# Patient Record
Sex: Female | Born: 1935
Health system: Southern US, Community
[De-identification: ages and names within clinical notes are randomized; demographics above are authoritative.]

## PROBLEM LIST (undated history)

## (undated) DIAGNOSIS — M1990 Unspecified osteoarthritis, unspecified site: Secondary | ICD-10-CM

## (undated) DIAGNOSIS — M75100 Unspecified rotator cuff tear or rupture of unspecified shoulder, not specified as traumatic: Secondary | ICD-10-CM

## (undated) DIAGNOSIS — I4891 Unspecified atrial fibrillation: Secondary | ICD-10-CM

## (undated) DIAGNOSIS — K5732 Diverticulitis of large intestine without perforation or abscess without bleeding: Secondary | ICD-10-CM

## (undated) DIAGNOSIS — F09 Unspecified mental disorder due to known physiological condition: Secondary | ICD-10-CM

## (undated) DIAGNOSIS — D126 Benign neoplasm of colon, unspecified: Secondary | ICD-10-CM

## (undated) DIAGNOSIS — L4050 Arthropathic psoriasis, unspecified: Secondary | ICD-10-CM

## (undated) DIAGNOSIS — G250 Essential tremor: Secondary | ICD-10-CM

## (undated) DIAGNOSIS — F329 Major depressive disorder, single episode, unspecified: Secondary | ICD-10-CM

## (undated) DIAGNOSIS — N83209 Unspecified ovarian cyst, unspecified side: Secondary | ICD-10-CM

## (undated) DIAGNOSIS — I10 Essential (primary) hypertension: Secondary | ICD-10-CM

## (undated) DIAGNOSIS — E785 Hyperlipidemia, unspecified: Secondary | ICD-10-CM

## (undated) DIAGNOSIS — M810 Age-related osteoporosis without current pathological fracture: Secondary | ICD-10-CM

## (undated) DIAGNOSIS — F32A Depression, unspecified: Secondary | ICD-10-CM

## (undated) DIAGNOSIS — J449 Chronic obstructive pulmonary disease, unspecified: Secondary | ICD-10-CM

## (undated) HISTORY — DX: Benign neoplasm of colon, unspecified: D12.6

## (undated) HISTORY — PX: ABDOMINAL HYSTERECTOMY: SHX81

## (undated) HISTORY — DX: Unspecified atrial fibrillation: I48.91

## (undated) HISTORY — DX: Essential tremor: G25.0

## (undated) HISTORY — DX: Hyperlipidemia, unspecified: E78.5

## (undated) HISTORY — PX: COLONOSCOPY: SHX174

## (undated) HISTORY — DX: Age-related osteoporosis without current pathological fracture: M81.0

## (undated) HISTORY — DX: Major depressive disorder, single episode, unspecified: F32.9

## (undated) HISTORY — DX: Unspecified mental disorder due to known physiological condition: F09

## (undated) HISTORY — DX: Unspecified osteoarthritis, unspecified site: M19.90

## (undated) HISTORY — DX: Diverticulitis of large intestine without perforation or abscess without bleeding: K57.32

## (undated) HISTORY — DX: Unspecified ovarian cyst, unspecified side: N83.209

## (undated) HISTORY — DX: Arthropathic psoriasis, unspecified: L40.50

## (undated) HISTORY — DX: Chronic obstructive pulmonary disease, unspecified: J44.9

## (undated) HISTORY — DX: Depression, unspecified: F32.A

## (undated) HISTORY — DX: Unspecified rotator cuff tear or rupture of unspecified shoulder, not specified as traumatic: M75.100

---

## 2005-06-13 ENCOUNTER — Ambulatory Visit: Payer: Self-pay | Admitting: Family Medicine

## 2006-06-14 ENCOUNTER — Ambulatory Visit: Payer: Self-pay | Admitting: Family Medicine

## 2007-06-15 ENCOUNTER — Ambulatory Visit: Payer: Self-pay | Admitting: Unknown Physician Specialty

## 2007-09-19 ENCOUNTER — Ambulatory Visit: Payer: Self-pay | Admitting: Family Medicine

## 2008-12-24 ENCOUNTER — Ambulatory Visit: Payer: Self-pay | Admitting: Family Medicine

## 2010-01-26 ENCOUNTER — Ambulatory Visit: Payer: Self-pay | Admitting: Family Medicine

## 2010-03-23 ENCOUNTER — Ambulatory Visit: Payer: Self-pay | Admitting: Ophthalmology

## 2010-04-06 ENCOUNTER — Ambulatory Visit: Payer: Self-pay | Admitting: Ophthalmology

## 2010-06-22 ENCOUNTER — Ambulatory Visit: Payer: Self-pay | Admitting: Ophthalmology

## 2010-07-06 ENCOUNTER — Ambulatory Visit: Payer: Self-pay | Admitting: Ophthalmology

## 2011-03-30 ENCOUNTER — Ambulatory Visit: Payer: Self-pay | Admitting: Family Medicine

## 2011-05-09 ENCOUNTER — Emergency Department: Payer: Self-pay | Admitting: Internal Medicine

## 2011-09-30 ENCOUNTER — Emergency Department: Payer: Self-pay | Admitting: Emergency Medicine

## 2011-10-05 ENCOUNTER — Emergency Department: Payer: Self-pay | Admitting: *Deleted

## 2011-10-10 ENCOUNTER — Ambulatory Visit: Payer: Self-pay | Admitting: Urology

## 2011-10-14 ENCOUNTER — Ambulatory Visit: Payer: Self-pay | Admitting: Urology

## 2011-11-01 ENCOUNTER — Ambulatory Visit: Payer: Self-pay | Admitting: Urology

## 2011-11-29 ENCOUNTER — Ambulatory Visit: Payer: Self-pay | Admitting: Oncology

## 2011-11-30 LAB — CANCER ANTIGEN 19-9: CA 19-9: 20 U/mL (ref 0–35)

## 2011-11-30 LAB — CEA: CEA: 3.9 ng/mL (ref 0.0–4.7)

## 2011-12-20 ENCOUNTER — Ambulatory Visit: Payer: Self-pay | Admitting: Oncology

## 2012-03-08 ENCOUNTER — Ambulatory Visit: Payer: Self-pay | Admitting: Oncology

## 2012-03-12 ENCOUNTER — Ambulatory Visit: Payer: Self-pay | Admitting: Oncology

## 2012-03-19 ENCOUNTER — Ambulatory Visit: Payer: Self-pay | Admitting: Oncology

## 2014-09-15 ENCOUNTER — Emergency Department: Payer: Self-pay | Admitting: Emergency Medicine

## 2014-09-15 LAB — CBC
HCT: 38.7 % (ref 35.0–47.0)
HGB: 12.2 g/dL (ref 12.0–16.0)
MCH: 27.8 pg (ref 26.0–34.0)
MCHC: 31.5 g/dL — AB (ref 32.0–36.0)
MCV: 88 fL (ref 80–100)
PLATELETS: 181 10*3/uL (ref 150–440)
RBC: 4.38 10*6/uL (ref 3.80–5.20)
RDW: 14.7 % — ABNORMAL HIGH (ref 11.5–14.5)
WBC: 8.9 10*3/uL (ref 3.6–11.0)

## 2014-09-15 LAB — COMPREHENSIVE METABOLIC PANEL
ALBUMIN: 3 g/dL — AB (ref 3.4–5.0)
ALK PHOS: 67 U/L
ALT: 20 U/L
Anion Gap: 5 — ABNORMAL LOW (ref 7–16)
BUN: 19 mg/dL — ABNORMAL HIGH (ref 7–18)
Bilirubin,Total: 0.3 mg/dL (ref 0.2–1.0)
CREATININE: 0.77 mg/dL (ref 0.60–1.30)
Calcium, Total: 8.4 mg/dL — ABNORMAL LOW (ref 8.5–10.1)
Chloride: 107 mmol/L (ref 98–107)
Co2: 28 mmol/L (ref 21–32)
EGFR (African American): >60
Glucose: 88 mg/dL (ref 65–99)
Osmolality: 281 (ref 275–301)
POTASSIUM: 3.8 mmol/L (ref 3.5–5.1)
SGOT(AST): 20 U/L (ref 15–37)
Sodium: 140 mmol/L (ref 136–145)
TOTAL PROTEIN: 7.2 g/dL (ref 6.4–8.2)

## 2014-09-15 LAB — PROTIME-INR
INR: 1
PROTHROMBIN TIME: 13.3 s (ref 11.5–14.7)

## 2015-01-05 DIAGNOSIS — M79641 Pain in right hand: Secondary | ICD-10-CM | POA: Diagnosis not present

## 2015-01-05 DIAGNOSIS — M810 Age-related osteoporosis without current pathological fracture: Secondary | ICD-10-CM | POA: Diagnosis not present

## 2015-01-05 DIAGNOSIS — M1990 Unspecified osteoarthritis, unspecified site: Secondary | ICD-10-CM | POA: Diagnosis not present

## 2015-01-05 DIAGNOSIS — G250 Essential tremor: Secondary | ICD-10-CM | POA: Diagnosis not present

## 2015-01-28 DIAGNOSIS — M1990 Unspecified osteoarthritis, unspecified site: Secondary | ICD-10-CM | POA: Diagnosis not present

## 2015-02-05 DIAGNOSIS — L4050 Arthropathic psoriasis, unspecified: Secondary | ICD-10-CM | POA: Diagnosis not present

## 2015-03-30 DIAGNOSIS — L4050 Arthropathic psoriasis, unspecified: Secondary | ICD-10-CM | POA: Diagnosis not present

## 2015-04-06 DIAGNOSIS — M0609 Rheumatoid arthritis without rheumatoid factor, multiple sites: Secondary | ICD-10-CM | POA: Diagnosis not present

## 2015-04-20 DIAGNOSIS — I10 Essential (primary) hypertension: Secondary | ICD-10-CM | POA: Diagnosis not present

## 2015-04-20 DIAGNOSIS — L4050 Arthropathic psoriasis, unspecified: Secondary | ICD-10-CM | POA: Diagnosis not present

## 2015-04-20 DIAGNOSIS — M810 Age-related osteoporosis without current pathological fracture: Secondary | ICD-10-CM | POA: Insufficient documentation

## 2015-05-13 DIAGNOSIS — M0609 Rheumatoid arthritis without rheumatoid factor, multiple sites: Secondary | ICD-10-CM | POA: Diagnosis not present

## 2015-05-13 DIAGNOSIS — M810 Age-related osteoporosis without current pathological fracture: Secondary | ICD-10-CM | POA: Diagnosis not present

## 2015-05-20 DIAGNOSIS — Z79899 Other long term (current) drug therapy: Secondary | ICD-10-CM | POA: Diagnosis not present

## 2015-05-20 DIAGNOSIS — M0609 Rheumatoid arthritis without rheumatoid factor, multiple sites: Secondary | ICD-10-CM | POA: Diagnosis not present

## 2015-05-20 DIAGNOSIS — M810 Age-related osteoporosis without current pathological fracture: Secondary | ICD-10-CM | POA: Diagnosis not present

## 2015-05-20 DIAGNOSIS — G250 Essential tremor: Secondary | ICD-10-CM | POA: Diagnosis not present

## 2015-05-26 ENCOUNTER — Emergency Department
Admission: EM | Admit: 2015-05-26 | Discharge: 2015-05-26 | Disposition: A | Discharge status: Home / Self Care | Payer: Commercial Managed Care - HMO | Attending: Emergency Medicine | Admitting: Emergency Medicine

## 2015-05-26 ENCOUNTER — Emergency Department: Payer: Commercial Managed Care - HMO

## 2015-05-26 ENCOUNTER — Encounter: Payer: Self-pay | Admitting: Emergency Medicine

## 2015-05-26 ENCOUNTER — Other Ambulatory Visit: Payer: Self-pay

## 2015-05-26 DIAGNOSIS — R4182 Altered mental status, unspecified: Secondary | ICD-10-CM | POA: Diagnosis present

## 2015-05-26 DIAGNOSIS — N390 Urinary tract infection, site not specified: Secondary | ICD-10-CM | POA: Insufficient documentation

## 2015-05-26 DIAGNOSIS — Z87891 Personal history of nicotine dependence: Secondary | ICD-10-CM | POA: Diagnosis not present

## 2015-05-26 DIAGNOSIS — R410 Disorientation, unspecified: Secondary | ICD-10-CM | POA: Diagnosis not present

## 2015-05-26 DIAGNOSIS — I10 Essential (primary) hypertension: Secondary | ICD-10-CM | POA: Diagnosis not present

## 2015-05-26 DIAGNOSIS — Z79899 Other long term (current) drug therapy: Secondary | ICD-10-CM | POA: Diagnosis not present

## 2015-05-26 DIAGNOSIS — Z7982 Long term (current) use of aspirin: Secondary | ICD-10-CM | POA: Insufficient documentation

## 2015-05-26 DIAGNOSIS — I499 Cardiac arrhythmia, unspecified: Secondary | ICD-10-CM | POA: Diagnosis not present

## 2015-05-26 DIAGNOSIS — I638 Other cerebral infarction: Secondary | ICD-10-CM | POA: Diagnosis not present

## 2015-05-26 DIAGNOSIS — R4781 Slurred speech: Secondary | ICD-10-CM | POA: Diagnosis not present

## 2015-05-26 DIAGNOSIS — R079 Chest pain, unspecified: Secondary | ICD-10-CM | POA: Diagnosis not present

## 2015-05-26 HISTORY — DX: Essential (primary) hypertension: I10

## 2015-05-26 HISTORY — DX: Unspecified osteoarthritis, unspecified site: M19.90

## 2015-05-26 LAB — CBC
HCT: 39.6 % (ref 35.0–47.0)
Hemoglobin: 12.6 g/dL (ref 12.0–16.0)
MCH: 28.7 pg (ref 26.0–34.0)
MCHC: 31.8 g/dL — AB (ref 32.0–36.0)
MCV: 90.2 fL (ref 80.0–100.0)
PLATELETS: 210 10*3/uL (ref 150–440)
RBC: 4.39 MIL/uL (ref 3.80–5.20)
RDW: 17.4 % — ABNORMAL HIGH (ref 11.5–14.5)
WBC: 7.5 10*3/uL (ref 3.6–11.0)

## 2015-05-26 LAB — URINALYSIS COMPLETE WITH MICROSCOPIC (ARMC ONLY)
Bacteria, UA: NONE SEEN
Bilirubin Urine: NEGATIVE
GLUCOSE, UA: NEGATIVE mg/dL
Ketones, ur: NEGATIVE mg/dL
Nitrite: POSITIVE — AB
PROTEIN: NEGATIVE mg/dL
SQUAMOUS EPITHELIAL / LPF: NONE SEEN
Specific Gravity, Urine: 1.004 — ABNORMAL LOW (ref 1.005–1.030)
pH: 7 (ref 5.0–8.0)

## 2015-05-26 LAB — COMPREHENSIVE METABOLIC PANEL
ALK PHOS: 62 U/L (ref 38–126)
ALT: 17 U/L (ref 14–54)
AST: 30 U/L (ref 15–41)
Albumin: 3.9 g/dL (ref 3.5–5.0)
Anion gap: 11 (ref 5–15)
BUN: 14 mg/dL (ref 6–20)
CHLORIDE: 101 mmol/L (ref 101–111)
CO2: 27 mmol/L (ref 22–32)
CREATININE: 0.83 mg/dL (ref 0.44–1.00)
Calcium: 9.9 mg/dL (ref 8.9–10.3)
Glucose, Bld: 89 mg/dL (ref 65–99)
POTASSIUM: 4.1 mmol/L (ref 3.5–5.1)
Sodium: 139 mmol/L (ref 135–145)
TOTAL PROTEIN: 7.5 g/dL (ref 6.5–8.1)
Total Bilirubin: 0.6 mg/dL (ref 0.3–1.2)

## 2015-05-26 MED ORDER — CEFTRIAXONE SODIUM IN DEXTROSE 20 MG/ML IV SOLN
1.0000 g | Freq: Once | INTRAVENOUS | Status: AC
  Administered 2015-05-26: 1 g via INTRAVENOUS

## 2015-05-26 MED ORDER — CEFTRIAXONE SODIUM IN DEXTROSE 20 MG/ML IV SOLN
INTRAVENOUS | Status: AC
  Administered 2015-05-26: 1 g via INTRAVENOUS
  Filled 2015-05-26: qty 50

## 2015-05-26 MED ORDER — CEPHALEXIN 500 MG PO CAPS: 500.0000 mg | ORAL_CAPSULE | Freq: Four times a day (QID) | ORAL | Status: DC

## 2015-05-26 NOTE — ED Notes (Signed)
Daughter reports pt has had increased confusion x4 days; reports went to urgent care today and dx with UTI but sent here for further eval. MD at bedside in room.

## 2015-05-26 NOTE — ED Notes (Signed)
Patient presents to the ED with multiple episodes of weakness and confusion over the past several days.  Patient is alert and oriented x 4 at this time.  Patient states she has been having episodes where she has more trouble finding her words or remembering things than usual.  Patient also reports being, "wobbly" when she walks.  Patient was seen at the Urgent Care and was diagnosed with a UTI but the provider there wanted to her be more fully evaluated.  Patient is no obvious distress at this time.

## 2015-05-26 NOTE — Discharge Instructions (Signed)

## 2015-05-26 NOTE — ED Provider Notes (Signed)
[facility] Emergency Department Provider Note  ____________________________________________  Time seen: Approximately 4:48 PM  I have reviewed the triage vital signs and the nursing notes.   HISTORY  Chief Complaint Altered Mental Status    HPI Victoria Esparza is a 81 y.o. female who comes in with her daughter. She is very pleasant and she and her daughter explained to me that on Sunday she was having a fair amount of confusion, trouble operating the blinds in her home, and sometimes her speech seemed a little bit off. This slowly improved and has become better over the last 2 days, but at times she feels just "foggy" and occasionally feels like very tired. She went to urgent care today because her doctor was unavailable, and they diagnosed her with a "UTI" but they did not provide her prescription and recommended she come to the ER to have her confusion further evaluated.  She has not noticed any change in urination. She is not having abdominal pain. She is not having any fever or cough or chest pain. She denies any new symptoms like that of a stroke except for occasionally feeling like "foggy". She has not pain numbness or tingling in her arms or legs. She denies any weakness or face arms or legs. She is on a trouble swallowing. She's never had a previous stroke. She does take a baby aspirin every day.   Past Medical History  Diagnosis Date  . Hypertension   . Arthritis     There are no active problems to display for this patient.   History reviewed. No pertinent past surgical history.  Current Outpatient Rx  Name  Route  Sig  Dispense  Refill  . alendronate (FOSAMAX) 70 MG tablet   Oral   Take 70 mg by mouth once a week. Take with a full glass of water on an empty stomach.         Marland Kitchen aspirin 325 MG EC tablet   Oral   Take 325 mg by mouth daily.         . cephALEXin (KEFLEX) 500 MG capsule   Oral   Take 1 capsule (500 mg total) by mouth 4  (four) times daily.   40 capsule   0   . folic acid (FOLVITE) 1 MG tablet   Oral   Take 1 mg by mouth daily.         . metoprolol tartrate (LOPRESSOR) 25 MG tablet   Oral   Take 25 mg by mouth 2 (two) times daily.           Allergies Review of patient's allergies indicates no known allergies.  No family history on file.  Social History History  Substance Use Topics  . Smoking status: Former Smoker    Quit date: 05/26/1995  . Smokeless tobacco: Not on file  . Alcohol Use: No    Review of Systems Constitutional: No fever/chills Eyes: No visual changes. ENT: No sore throat. Cardiovascular: Denies chest pain. Respiratory: Denies shortness of breath. Gastrointestinal: No abdominal pain.  No nausea, no vomiting.  No diarrhea.  No constipation. Genitourinary: Negative for dysuria. Musculoskeletal: Negative for back pain. Skin: Negative for rash. Neurological: Negative for headaches, focal weakness or numbness. See history of present illness  10-point ROS otherwise negative.  ____________________________________________   PHYSICAL EXAM:  VITAL SIGNS: ED Triage Vitals  Enc Vitals Group     BP 05/26/15 1409 153/71 mmHg     Pulse Rate 05/26/15 1409 55  Resp 05/26/15 1409 16     Temp 05/26/15 1409 97.4 F (36.3 C)     Temp Source 05/26/15 1409 Oral     SpO2 05/26/15 1409 94 %     Weight 05/26/15 1409 150 lb (68.04 kg)     Height 05/26/15 1409 5\' 3"  (1.6 m)     Head Cir --      Peak Flow --      Pain Score 05/26/15 1409 0     Pain Loc --      Pain Edu? --      Excl. in [place]? --     Constitutional: Alert and oriented. Well appearing and in no acute distress. The patient does have obvious essential tremor of her head and upper arms, but this abates with focus and concentration. Eyes: Conjunctivae are normal. PERRL. EOMI. Head: Atraumatic. Nose: No congestion/rhinnorhea. Mouth/Throat: Mucous membranes are moist.  Oropharynx non-erythematous. Neck: No  stridor.   Cardiovascular: Normal rate, regular rhythm. Grossly normal heart sounds.  Good peripheral circulation. Respiratory: Normal respiratory effort.  No retractions. Lungs CTAB. Gastrointestinal: Soft and nontender. No distention. No abdominal bruits. No CVA tenderness. Musculoskeletal: No lower extremity tenderness nor edema.  No joint effusions. Neurologic:  Normal speech and language. No gross focal neurologic deficits are appreciated. Speech is normal. No gait instability. I performed NIH exam and score equals 0. There is no evidence of an acute neurologic deficit to my exam. Skin:  Skin is warm, dry and intact. No rash noted. Psychiatric: Mood and affect are normal. Speech and behavior are normal.  ____________________________________________   LABS (all labs ordered are listed, but only abnormal results are displayed)  Labs Reviewed  URINALYSIS COMPLETEWITH MICROSCOPIC ([facility] ONLY) - Abnormal; Notable for the following:    Color, Urine YELLOW (*)    APPearance CLEAR (*)    Specific Gravity, Urine 1.004 (*)    Hgb urine dipstick 1+ (*)    Nitrite POSITIVE (*)    Leukocytes, UA 2+ (*)    All other components within normal limits  CBC - Abnormal; Notable for the following:    MCHC 31.8 (*)    RDW 17.4 (*)    All other components within normal limits  URINE CULTURE  COMPREHENSIVE METABOLIC PANEL  CBG MONITORING, ED   ____________________________________________  EKG   Date: 05/26/2015  Rate: 58  Rhythm: normal sinus rhythm  QRS Axis: normal  Intervals: normal  ST/T Wave abnormalities: normal  Conduction Disutrbances: none  Narrative Interpretation: unremarkable, some tremor is present. The patient does have a history of  tremor     ____________________________________________  RADIOLOGY   IMPRESSION: No acute intracranial abnormality.  Mild cerebral atrophy and chronic small vessel  disease. ____________________________________________   PROCEDURES  Procedure(s) performed: None  Critical Care performed: No  ____________________________________________   INITIAL IMPRESSION / ASSESSMENT AND PLAN / ED COURSE  Pertinent labs & imaging results that were available during my care of the patient were reviewed by me and considered in my medical decision making (see chart for details).  The patient has a very reassuring exam and ER. We will obtain a urinalysis to evaluate for the possibility of UTI, which is a very common cause of confusion in the elderly. She is quite hemogram was stable. Her labs drawn at triage are normal. There is no indication to obtain LFTs. Her EKG demonstrates no ischemia. I will obtain a CT head as she has reported having some off and on difficulty with speaking and confusion,  after 2-3 days I would expect CT to show any infarct. She has no previous history of a stroke. Her exam right now does not suggest acute neurologic deficit or stroke. I also doubt that this represents a TIA. The patient is on baby aspirin daily for stroke and cardiovascular protection already.  ----------------------------------------- 6:21 PM on 05/26/2015 -----------------------------------------  Urinalysis reviewed which appears positive. There is no bacteria noted, however given the number of white cells along with positive leukocyte esterase feel this is likely representing urinary tract infection. We will send for culture. I will treat the patient with ceftriaxone as well as a week of Keflex as recommended by infectious disease team.  Patient will follow-up with her primary care doctor within 1-2 days for reevaluation. She has good care from family and friends, I advised her and her family on strict return precautions. Should her confusion worsen, she hasn't fevers, weakness, vomiting, or other new concerns arise she'll return to the  ER.  ____________________________________________   FINAL CLINICAL IMPRESSION(S) / ED DIAGNOSES  Final diagnoses:  Acute UTI (urinary tract infection)      Delman Kitten, MD 05/26/15 [id number]

## 2015-05-28 LAB — URINE CULTURE: Culture: >=100000

## 2015-06-15 DIAGNOSIS — L4050 Arthropathic psoriasis, unspecified: Secondary | ICD-10-CM | POA: Diagnosis not present

## 2015-06-15 DIAGNOSIS — N390 Urinary tract infection, site not specified: Secondary | ICD-10-CM | POA: Diagnosis not present

## 2015-06-25 DIAGNOSIS — M1990 Unspecified osteoarthritis, unspecified site: Secondary | ICD-10-CM | POA: Diagnosis not present

## 2015-06-25 DIAGNOSIS — N390 Urinary tract infection, site not specified: Secondary | ICD-10-CM | POA: Diagnosis not present

## 2015-06-29 DIAGNOSIS — D72829 Elevated white blood cell count, unspecified: Secondary | ICD-10-CM | POA: Diagnosis not present

## 2015-06-30 DIAGNOSIS — G250 Essential tremor: Secondary | ICD-10-CM | POA: Diagnosis not present

## 2015-06-30 DIAGNOSIS — M79641 Pain in right hand: Secondary | ICD-10-CM | POA: Diagnosis not present

## 2015-06-30 DIAGNOSIS — M0609 Rheumatoid arthritis without rheumatoid factor, multiple sites: Secondary | ICD-10-CM | POA: Diagnosis not present

## 2015-07-02 DIAGNOSIS — R7612 Nonspecific reaction to cell mediated immunity measurement of gamma interferon antigen response without active tuberculosis: Secondary | ICD-10-CM | POA: Diagnosis not present

## 2015-07-02 DIAGNOSIS — M0579 Rheumatoid arthritis with rheumatoid factor of multiple sites without organ or systems involvement: Secondary | ICD-10-CM | POA: Diagnosis not present

## 2015-07-07 DIAGNOSIS — E871 Hypo-osmolality and hyponatremia: Secondary | ICD-10-CM | POA: Diagnosis not present

## 2015-07-07 DIAGNOSIS — R7982 Elevated C-reactive protein (CRP): Secondary | ICD-10-CM | POA: Diagnosis not present

## 2015-07-07 DIAGNOSIS — D72828 Other elevated white blood cell count: Secondary | ICD-10-CM | POA: Diagnosis not present

## 2015-07-07 DIAGNOSIS — R700 Elevated erythrocyte sedimentation rate: Secondary | ICD-10-CM | POA: Diagnosis not present

## 2015-07-22 ENCOUNTER — Ambulatory Visit: Payer: Commercial Managed Care - HMO | Attending: Rheumatology | Admitting: Occupational Therapy

## 2015-07-22 DIAGNOSIS — M79641 Pain in right hand: Secondary | ICD-10-CM | POA: Diagnosis not present

## 2015-07-22 DIAGNOSIS — M6281 Muscle weakness (generalized): Secondary | ICD-10-CM

## 2015-07-22 DIAGNOSIS — M25631 Stiffness of right wrist, not elsewhere classified: Secondary | ICD-10-CM | POA: Insufficient documentation

## 2015-07-22 DIAGNOSIS — M25641 Stiffness of right hand, not elsewhere classified: Secondary | ICD-10-CM | POA: Insufficient documentation

## 2015-07-22 NOTE — Patient Instructions (Signed)
Contrast baths  AROM for tendon glides - separate steps - and 3 cm foam block in palm with full fist step  Opposition to all digits - tip to tip

## 2015-07-22 NOTE — Therapy (Signed)
[facility] [address] [place], [address] Phone: [phone]   Fax:  [phone]  Occupational Therapy Treatment  Patient Details  Name: Victoria Esparza MRN: [id number] Date of Birth: [birth year] Referring Provider:  Emmaline Kluver.,*  Encounter Date: 07/22/2015      OT End of Session - 07/22/15 1206    Visit Number 1   Number of Visits 8   Date for OT Re-Evaluation 08/19/15   OT Start Time 1025   OT Stop Time 1118   OT Time Calculation (min) 53 min   Activity Tolerance Patient tolerated treatment well   Behavior During Therapy [facility] for tasks assessed/performed      Past Medical History  Diagnosis Date  . Hypertension   . Arthritis     No past surgical history on file.  There were no vitals filed for this visit.  Visit Diagnosis:  Stiffness of joint, hand, right - Plan: Ot plan of care cert/re-cert  Stiffness of wrist joint, right - Plan: Ot plan of care cert/re-cert  Pain of right hand - Plan: Ot plan of care cert/re-cert  Muscle weakness - Plan: Ot plan of care cert/re-cert      Subjective Assessment - 07/22/15 1120    Subjective  The swelling and pain started just after my birthday in [birth year] - and mostly my R hand - I am squeezing ball    Patient Stated Goals Get the swelling and pain better in my R hand and to be able to grip and pick up things again    Currently in Pain? Yes   Pain Score 1    Pain Location Hand   Pain Orientation Right   Pain Descriptors / Indicators Aching   Pain Type Chronic pain   Pain Onset More than a month ago   Pain Frequency Other (Comment)   Aggravating Factors  gripping, moving, bending or using them - gets up to 7/10    Pain Relieving Factors pain meds            [facility] OT Assessment - 07/22/15 0001    Assessment   Diagnosis RA and psoriasis   Onset Date 05/30/15   Assessment Pt seen be Dr Jefm Bryant - was put on antibiotic for UTI and then  predisone - and still on 20Mg  dose - had increase edema  and pain in R wrist and digtits - refer to hand therapy    Home  Environment   Lives With Alone   Prior Function   Level of [place], clean around house, watch tv, some reading, and 56 yrs old grand son stays with her on weekends a night but not at the moment    AROM   Right Wrist Extension 48 Degrees   Right Wrist Flexion 60 Degrees   Left Wrist Extension 65 Degrees   Left Wrist Flexion 65 Degrees   Strength   Right Hand Grip (lbs) 2   Right Hand Lateral Pinch 6 lbs   Right Hand 3 Point Pinch 5 lbs   Left Hand Grip (lbs) 16   Left Hand Lateral Pinch 9 lbs   Left Hand 3 Point Pinch 5 lbs   Right Hand AROM   R Thumb Opposition to Index --  do not oppose to tips -    R Index  MCP 0-90 80 Degrees   R Index PIP 0-100 65 Degrees   R Long  MCP 0-90  80 Degrees   R Long PIP 0-100 60 Degrees   R Ring  MCP 0-90 75 Degrees   R Ring PIP 0-100 70 Degrees   R Little  MCP 0-90 80 Degrees                  OT Treatments/Exercises (OP) - 07-28-2015 0001    RUE Contrast Bath   Time 12 minutes   Comments done with pt to decrease edema and pain - prior to review of HEP - ROM at PIP's increased by 10 -15 degrees                OT Education - 28-Jul-2015 1205    Education provided Yes   Education Details HEP   Person(s) Educated Patient;Child(ren)   Methods Explanation;Demonstration;Tactile cues;Verbal cues;Handout   Comprehension Tactile cues required;Verbal cues required;Returned demonstration;Verbalized understanding          OT Short Term Goals - Jul 28, 2015 1210    OT SHORT TERM GOAL #1   Title Pt. will be indep. with HEP to increase AROM in all digts in R hand to increase function on PRWHE by 5-10 points   Baseline PIP's 60-70 flexion , Function on PRWHE 18.5/50   Time 3   Period Weeks   Status New   OT SHORT TERM GOAL #2   Title Pt. will have improvement with pain by 10 points on  PRWHE to increase use in ADL's and IADL's   Baseline Pain on PRWHE 19/50   Time 3   Period Weeks   Status New           OT Long Term Goals - 07/28/2015 1213    OT LONG TERM GOAL #1   Title Grip strength in R and L hand improve with 10 lbs to carry 5 lbs weight/groceries and hold steering wheel   Baseline Grip on R 2 lbs , L 16 lbs    Time 4   Period Weeks   Status New   OT LONG TERM GOAL #2   Title Wrist AROM for wrist extention improve with 10 degrees to push up from chair with more ease    Baseline wrist extentionR 48 , L 60    Time 4   Period Weeks   Status New               Plan - 07-28-15 1207    Clinical Impression Statement Pt present with increase pain in R hand and wrist - increase edema - and decrease ROM in all digits and wrist extention and flexion - Pt's grip strenght  in bilateral hands are severely impaired - llimiiting her functional use of bilateral hands but R more than L in ADL's and IADL's    Pt will benefit from skilled therapeutic intervention in order to improve on the following deficits (Retired) Decreased strength;Pain;Increased edema;Impaired flexibility;Decreased range of motion   Rehab Potential Good   OT Frequency 2x / week   OT Duration 4 weeks   OT Treatment/Interventions Self-care/ADL training;Contrast Bath;Fluidtherapy;Therapeutic exercise;Passive range of motion;Manual Therapy;Patient/family education   Plan assess performance of HEP and progress   OT Home Exercise Plan See pt instruction    Consulted and Agree with Plan of Care Patient;Family member/caregiver          G-Codes - 07-28-2015 1215    Functional Assessment Tool Used ROM , grip and prehension , PRWHE and clinical judgement   Functional Limitation Self care   Self Care Current Status (H4193) At  least 40 percent but less than 60 percent impaired, limited or restricted   Self Care Goal Status (Q5956) At least 1 percent but less than 20 percent impaired, limited or restricted       Problem List There are no active problems to display for this patient.   Nelda Luckey OTR/L, CLT 07/22/2015, 12:21 PM  [facility] [address], [place], [address] Phone: [phone]   Fax:  [phone]

## 2015-07-27 ENCOUNTER — Ambulatory Visit: Payer: Commercial Managed Care - HMO | Admitting: Occupational Therapy

## 2015-07-27 DIAGNOSIS — M79641 Pain in right hand: Secondary | ICD-10-CM | POA: Diagnosis not present

## 2015-07-27 DIAGNOSIS — M25641 Stiffness of right hand, not elsewhere classified: Secondary | ICD-10-CM | POA: Diagnosis not present

## 2015-07-27 DIAGNOSIS — M25631 Stiffness of right wrist, not elsewhere classified: Secondary | ICD-10-CM | POA: Diagnosis not present

## 2015-07-27 DIAGNOSIS — M6281 Muscle weakness (generalized): Secondary | ICD-10-CM | POA: Diagnosis not present

## 2015-07-27 NOTE — Patient Instructions (Signed)
Add light blue putty for gripping, rolling and pinch alternate fingers to L hand only   R hand - add composite flexion of 3rd thru 5th  And cont tendon gliding

## 2015-07-27 NOTE — Therapy (Signed)
[facility] [address] [place], [address] Phone: [phone]   Fax:  [phone]  Occupational Therapy Treatment  Patient Details  Name: Victoria Esparza MRN: [id number] Date of Birth: [birth year] Referring Provider:  Emmaline Kluver.,*  Encounter Date: 07/27/2015      OT End of Session - 07/27/15 1030    Visit Number 2   Number of Visits 8   Date for OT Re-Evaluation 08/19/15   OT Start Time 0941   OT Stop Time 1019   OT Time Calculation (min) 38 min   Activity Tolerance Patient tolerated treatment well   Behavior During Therapy [facility] for tasks assessed/performed      Past Medical History  Diagnosis Date  . Hypertension   . Arthritis     No past surgical history on file.  There were no vitals filed for this visit.  Visit Diagnosis:  Stiffness of joint, hand, right  Stiffness of wrist joint, right  Pain of right hand  Muscle weakness      Subjective Assessment - 07/27/15 0946    Subjective  I did the heatingpad and cold pack at home - exercises doing okay - no pain at rest except when trying to make fist    Patient Stated Goals Get the swelling and pain better in my R hand and to be able to grip and pick up things again    Currently in Pain? No/denies                      OT Treatments/Exercises (OP) - 07/27/15 0001    Hand Exercises   Other Hand Exercises Putty light blue - for gripping, rolling and pinch alternate finger tips - 15 reps - and rolling pinch 3 x thru    Other Hand Exercises PROM of PIP 's , composite flexion of all digits until sligth pull, MC flexion , intrinsic fist, full fist to 3 cm and 2 cm foam block -    RUE Contrast Bath   Time 12 minutes   Comments at [facility] to increase ROM and  decreaes pain    Manual Therapy   Manual therapy comments Soft tissue mobs ,MC spread and  to lateral bands of  PIP's prior to ROM but after contrast                  OT Education - 07/27/15 1030    Education provided Yes   Education Details HEP    Person(s) Educated Patient   Methods Explanation;Demonstration;Tactile cues;Verbal cues   Comprehension Verbal cues required;Verbalized understanding;Tactile cues required          OT Short Term Goals - 07/22/15 1210    OT SHORT TERM GOAL #1   Title Pt. will be indep. with HEP to increase AROM in all digts in R hand to increase function on PRWHE by 5-10 points   Baseline PIP's 60-70 flexion , Function on PRWHE 18.5/50   Time 3   Period Weeks   Status New   OT SHORT TERM GOAL #2   Title Pt. will have improvement with pain by 10 points on PRWHE to increase use in ADL's and IADL's   Baseline Pain on PRWHE 19/50   Time 3   Period Weeks   Status New           OT Long Term Goals - 07/22/15 1213    OT LONG TERM GOAL #1  Title Grip strength in R and L hand improve with 10 lbs to carry 5 lbs weight/groceries and hold steering wheel   Baseline Grip on R 2 lbs , L 16 lbs    Time 4   Period Weeks   Status New   OT LONG TERM GOAL #2   Title Wrist AROM for wrist extention improve with 10 degrees to push up from chair with more ease    Baseline wrist extentionR 48 , L 60    Time 4   Period Weeks   Status New               Plan - 07/27/15 1031    Clinical Impression Statement Attempted teal putty for L but some pain - decrease to light blue putty - add PROM to PIP's of R 3rd thru 5th and composite - to increaes ROM - gained in session great PIP flexion - add to HEP this date and to cont with AROM     Pt will benefit from skilled therapeutic intervention in order to improve on the following deficits (Retired) Decreased strength;Pain;Increased edema;Impaired flexibility;Decreased range of motion   Rehab Potential Good   OT Frequency 2x / week   OT Duration 4 weeks   OT Treatment/Interventions Self-care/ADL training;Contrast Bath;Fluidtherapy;Therapeutic exercise;Passive  range of motion;Manual Therapy;Patient/family education   Plan Pain , ROM at PIP's and HEP   OT Home Exercise Plan See pt instruction    Consulted and Agree with Plan of Care Patient;Family member/caregiver        Problem List There are no active problems to display for this patient.   Laken Lobato OTR/L, CLT 07/27/2015, 10:34 AM  [facility] [address], [place], [address] Phone: [phone]   Fax:  [phone]

## 2015-07-29 ENCOUNTER — Ambulatory Visit: Payer: Commercial Managed Care - HMO | Admitting: Occupational Therapy

## 2015-07-29 DIAGNOSIS — M25641 Stiffness of right hand, not elsewhere classified: Secondary | ICD-10-CM | POA: Diagnosis not present

## 2015-07-29 DIAGNOSIS — M25631 Stiffness of right wrist, not elsewhere classified: Secondary | ICD-10-CM | POA: Diagnosis not present

## 2015-07-29 DIAGNOSIS — M6281 Muscle weakness (generalized): Secondary | ICD-10-CM | POA: Diagnosis not present

## 2015-07-29 DIAGNOSIS — M79641 Pain in right hand: Secondary | ICD-10-CM

## 2015-07-29 NOTE — Therapy (Signed)
[facility] [address], [place], [address] Phone: [phone]   Fax:  [phone]  Occupational Therapy Treatment  Patient Details  Name: Victoria Esparza MRN: [id number] Date of Birth: 04-03-1928 Referring Provider:  Emmaline Kluver.,*  Encounter Date: 07/29/2015      OT End of Session - 07/29/15 1258    Visit Number 3   Number of Visits 8   Date for OT Re-Evaluation 08/19/15   OT Start Time 1000   OT Stop Time 1044   OT Time Calculation (min) 44 min   Activity Tolerance Patient tolerated treatment well   Behavior During Therapy [facility] for tasks assessed/performed      Past Medical History  Diagnosis Date  . Hypertension   . Arthritis     No past surgical history on file.  There were no vitals filed for this visit.  Visit Diagnosis:  Stiffness of joint, hand, right  Stiffness of wrist joint, right  Pain of right hand  Muscle weakness      Subjective Assessment - 07/29/15 1014    Subjective  Pain in my R hand about 4/10 - and L 1/10 - do have little more swelling in th R hand today - I forgot my putty last time    Patient Stated Goals Get the swelling and pain better in my R hand and to be able to grip and pick up things again    Currently in Pain? Yes   Pain Score 4    Pain Location Hand   Pain Orientation Right   Pain Descriptors / Indicators Aching   Pain Type Chronic pain   Pain Frequency Constant            [facility] OT Assessment - 07/29/15 0001    AROM   Right Wrist Extension 60 Degrees   Right Hand AROM   R Index  MCP 0-90 90 Degrees   R Index PIP 0-100 85 Degrees   R Long PIP 0-100 85 Degrees   R Ring  MCP 0-90 86 Degrees   R Ring PIP 0-100 85 Degrees   R Little PIP 0-100 75 Degrees                  OT Treatments/Exercises (OP) - 07/29/15 0001    Hand Exercises   Other Hand Exercises Putty light blue - for gripping, rolling and pinch alternate  finger tips - 15 reps - and rolling pinch 3 x thru    Other Hand Exercises PROM of PIP 's , composite flexion of all digits until sligth pull, MC flexion , intrinsic fist, full fist to  2 cm fand 1 cm foam block    RUE Paraffin   Number Minutes Paraffin 10 Minutes   RUE Paraffin Location Hand   Comments at [facility] to decrease pain and increase ROM in digits    Manual Therapy   Manual therapy comments Soft tissue mobs ,MC spread and  to lateral bands of  PIP's prior to ROM but after parafin                OT Education - 07/29/15 1258    Education provided Yes   Education Details HEP   Person(s) Educated Patient   Methods Explanation;Demonstration;Tactile cues;Verbal cues;Handout   Comprehension Verbalized understanding;Returned demonstration;Verbal cues required          OT Short Term Goals - 07/22/15 1210    OT SHORT TERM GOAL #  1   Title Pt. will be indep. with HEP to increase AROM in all digts in R hand to increase function on PRWHE by 5-10 points   Baseline PIP's 60-70 flexion , Function on PRWHE 18.5/50   Time 3   Period Weeks   Status New   OT SHORT TERM GOAL #2   Title Pt. will have improvement with pain by 10 points on PRWHE to increase use in ADL's and IADL's   Baseline Pain on PRWHE 19/50   Time 3   Period Weeks   Status New           OT Long Term Goals - 07/22/15 1213    OT LONG TERM GOAL #1   Title Grip strength in R and L hand improve with 10 lbs to carry 5 lbs weight/groceries and hold steering wheel   Baseline Grip on R 2 lbs , L 16 lbs    Time 4   Period Weeks   Status New   OT LONG TERM GOAL #2   Title Wrist AROM for wrist extention improve with 10 degrees to push up from chair with more ease    Baseline wrist extentionR 48 , L 60    Time 4   Period Weeks   Status New               Plan - 07/29/15 1259    Clinical Impression Statement Pt showed this date increase of about 10 degrees of flexion at [facility] and 20 at PIP after parafin and  ROM - add composite flexion PROM to HEP and putty for L hand - will assess ROM at digits coming in next session - did talk with pt and duaghter about fall risk and balance - pt  mobility did decrease since UTI significantly - recommend PT - ask if can fax order to DR Jefm Bryant - she holds on to somebody out side of house , has walker - and get tired - prior was dirving and allways on the go -    Pt will benefit from skilled therapeutic intervention in order to improve on the following deficits (Retired) Decreased strength;Pain;Increased edema;Impaired flexibility;Decreased range of motion   Rehab Potential Good   OT Frequency 2x / week   OT Duration 4 weeks   OT Treatment/Interventions Self-care/ADL training;Contrast Bath;Fluidtherapy;Therapeutic exercise;Passive range of motion;Manual Therapy;Patient/family education   Plan Pain , ROM at diigits coming in and HEP - fax PT order for balance    OT Home Exercise Plan See pt instruction    Consulted and Agree with Plan of Care Patient;Family member/caregiver        Problem List There are no active problems to display for this patient.   Rosalyn Gess OTR/L,CLT 07/29/2015, 1:03 PM  [facility] [address], [place], [address] Phone: [phone]   Fax:  [phone]

## 2015-07-29 NOTE — Patient Instructions (Signed)
PROM composite add and reinforce to HEP for all digits - prior to tendon glides   Putty light blue for L hand - pt forgot it last time   Talked with daughter about HEP and PT eval for balance recommend

## 2015-08-03 ENCOUNTER — Ambulatory Visit: Payer: Commercial Managed Care - HMO | Admitting: Occupational Therapy

## 2015-08-03 DIAGNOSIS — M79641 Pain in right hand: Secondary | ICD-10-CM

## 2015-08-03 DIAGNOSIS — M25631 Stiffness of right wrist, not elsewhere classified: Secondary | ICD-10-CM

## 2015-08-03 DIAGNOSIS — M6281 Muscle weakness (generalized): Secondary | ICD-10-CM | POA: Diagnosis not present

## 2015-08-03 DIAGNOSIS — M25641 Stiffness of right hand, not elsewhere classified: Secondary | ICD-10-CM

## 2015-08-03 NOTE — Therapy (Signed)
[facility] [address] [place], [address] Phone: [phone]   Fax:  [phone]  Occupational Therapy Treatment  Patient Details  Name: Victoria Esparza MRN: [id number] Date of Birth: 08-28-1928 Referring Provider:  Emmaline Kluver.,*  Encounter Date: 08/03/2015      OT End of Session - 08/03/15 1441    OT Start Time 1405   OT Stop Time 1440   OT Time Calculation (min) 35 min   Equipment Utilized During Treatment blue foam handle build up   Activity Tolerance Patient limited by pain   Behavior During Therapy [facility] for tasks assessed/performed      Past Medical History  Diagnosis Date  . Hypertension   . Arthritis     No past surgical history on file.  There were no vitals filed for this visit.  Visit Diagnosis:  Pain of right hand  Stiffness of joint, hand, right  Stiffness of wrist joint, right      Subjective Assessment - 08/03/15 1359    Subjective  Pain is 5/10 my right hand swelled up over the weekend.  Pain medication made me constipated and sick. so I stopped taking the pain medication.    Currently in Pain? Yes   Pain Score 5    Pain Location Hand   Pain Orientation Right   Pain Descriptors / Indicators Aching;Dull   Pain Type Chronic pain   Pain Onset More than a month ago   Pain Frequency Several days a week   Pain Relieving Factors cold , rest, massage   Effect of Pain on Daily Activities Do everything i possibly can : dishes, dressed   Multiple Pain Sites No                              OT Education - 08/03/15 1439    Education provided Yes   Education Details use of cold pack in elevation to reduce pain and edema, follow with gentle rolling/squeezing of blue utensil foam build up   [employer]) Educated Patient   Methods Explanation;Demonstration   Comprehension Verbalized understanding;Returned demonstration          OT Short Term Goals  - 07/22/15 1210    OT SHORT TERM GOAL #1   Title Pt. will be indep. with HEP to increase AROM in all digts in R hand to increase function on PRWHE by 5-10 points   Baseline PIP's 60-70 flexion , Function on PRWHE 18.5/50   Time 3   Period Weeks   Status New   OT SHORT TERM GOAL #2   Title Pt. will have improvement with pain by 10 points on PRWHE to increase use in ADL's and IADL's   Baseline Pain on PRWHE 19/50   Time 3   Period Weeks   Status New           OT Long Term Goals - 07/22/15 1213    OT LONG TERM GOAL #1   Title Grip strength in R and L hand improve with 10 lbs to carry 5 lbs weight/groceries and hold steering wheel   Baseline Grip on R 2 lbs , L 16 lbs    Time 4   Period Weeks   Status New   OT LONG TERM GOAL #2   Title Wrist AROM for wrist extention improve with 10 degrees to push up from chair with more ease  Baseline wrist extentionR 48 , L 60    Time 4   Period Weeks   Status New      OBJ: applied cold pack in elevation during manual therapy, retrograde massage, AROM to pain limitations, gentle AAROM in flexion of digits, extension is WFL. Wrist ROM reviewed, WFL Edema and Pain reduced during treatment session.  Patient able to touch to palm with all digits post AROM exercises Added rolling with blue foam utensil aid, gentle grasp as well.  Instructed patient in use of cold pack, elevation, gentle AROM  Plan: continue with AROM PT intervention planned for balance issues upon completion of UE treatments         Problem List There are no active problems to display for this patient.   Mostafa Yuan 08/03/2015, 2:47 PM  [place] [facility] [address], [place], [address] Phone: [phone]   Fax:  [phone]

## 2015-08-05 ENCOUNTER — Ambulatory Visit: Payer: Commercial Managed Care - HMO | Admitting: Occupational Therapy

## 2015-08-05 DIAGNOSIS — M25641 Stiffness of right hand, not elsewhere classified: Secondary | ICD-10-CM

## 2015-08-05 DIAGNOSIS — M79641 Pain in right hand: Secondary | ICD-10-CM | POA: Diagnosis not present

## 2015-08-05 DIAGNOSIS — M25631 Stiffness of right wrist, not elsewhere classified: Secondary | ICD-10-CM

## 2015-08-05 DIAGNOSIS — M6281 Muscle weakness (generalized): Secondary | ICD-10-CM | POA: Diagnosis not present

## 2015-08-05 NOTE — Therapy (Signed)
[facility] [facility] [address], [place], [address] Phone: [phone]   Fax:  [phone]  Occupational Therapy Treatment  Patient Details  Name: Victoria Esparza MRN: [id number] Date of Birth: Jul 05, 1928 Referring Provider:  Emmaline Kluver.,*  Encounter Date: 08/05/2015    Past Medical History  Diagnosis Date  . Hypertension   . Arthritis     No past surgical history on file.  There were no vitals filed for this visit.  Visit Diagnosis:  Pain of right hand  Stiffness of joint, hand, right  Stiffness of wrist joint, right  Muscle weakness      Subjective Assessment - 08/05/15 1003    Subjective  My right hand and wrist still painful. 5/10. I am waiting to hear from insurance re: some medication to help with my pain.  I cannot afford the medication they are recommending.    Currently in Pain? Yes   Pain Score 5    Pain Location Hand   Pain Orientation Right   Pain Descriptors / Indicators Aching                              OT Education - 08/05/15 1031    Education provided Yes   Education Details Add built up handles to any implements around the home that Victoria Esparza can use to increase functional use of right hand. Her pain reduces when she moves and uses her right hand   Person(s) Educated Patient;Child(ren)   Methods Explanation;Demonstration   Comprehension Verbalized understanding;Returned demonstration          OT Short Term Goals - 07/22/15 1210    OT SHORT TERM GOAL #1   Title Pt. will be indep. with HEP to increase AROM in all digts in R hand to increase function on PRWHE by 5-10 points   Baseline PIP's 60-70 flexion , Function on PRWHE 18.5/50   Time 3   Period Weeks   Status New   OT SHORT TERM GOAL #2   Title Pt. will have improvement with pain by 10 points on PRWHE to increase use in ADL's and IADL's   Baseline Pain on PRWHE 19/50   Time 3   Period Weeks   Status New           OT Long Term Goals - 07/22/15 1213    OT LONG TERM GOAL #1   Title Grip strength in R and L hand improve with 10 lbs to carry 5 lbs weight/groceries and hold steering wheel   Baseline Grip on R 2 lbs , L 16 lbs    Time 4   Period Weeks   Status New   OT LONG TERM GOAL #2   Title Wrist AROM for wrist extention improve with 10 degrees to push up from chair with more ease    Baseline wrist extentionR 48 , L 60    Time 4   Period Weeks   Status New               Plan - 08/05/15 1033    Clinical Impression Statement Patient pain and motion improve with AROM. Pain persists and limits function. Added build up handles to increase functional grasp of items around the home.     OT Home Exercise Plan continue with AROM of digits and wrist, build up handles for use at home   Consulted and Agree with  Plan of Care Patient     OBJ: AROM digits thumb and wrist . Motion improves and pain decreases with ROM. Added 1 # weight to elbow flexion and extension on padded surface. 1# weight to wrist extension and flexion. Well tolerated.  Used pipe insulation on weight handle to increase grasp with all digits: Very effective POT: Discussed adding pipe insulation to handles of home items with patient and her daughter : to increase grasp and functional use of right UE.  Discussed balance / gait work with patient and daughter: trying to put appointments together to acomodate family travel issues    Problem List There are no active problems to display for this patient.   Victoria Esparza 08/05/2015, 10:46 AM  [facility] [address], [place], [address] Phone: [phone]   Fax:  [phone]

## 2015-08-10 ENCOUNTER — Ambulatory Visit: Payer: Commercial Managed Care - HMO | Admitting: Occupational Therapy

## 2015-08-10 DIAGNOSIS — M79641 Pain in right hand: Secondary | ICD-10-CM

## 2015-08-10 DIAGNOSIS — M25631 Stiffness of right wrist, not elsewhere classified: Secondary | ICD-10-CM | POA: Diagnosis not present

## 2015-08-10 DIAGNOSIS — M6281 Muscle weakness (generalized): Secondary | ICD-10-CM | POA: Diagnosis not present

## 2015-08-10 DIAGNOSIS — M25641 Stiffness of right hand, not elsewhere classified: Secondary | ICD-10-CM

## 2015-08-10 NOTE — Therapy (Signed)
[facility] [address] [place], [address] Phone: [phone]   Fax:  [phone]  Occupational Therapy Treatment  Patient Details  Name: Victoria Esparza MRN: [id number] Date of Birth: May 08, 1928 Referring Provider:  Emmaline Kluver.,*  Encounter Date: 08/10/2015      OT End of Session - 08/10/15 1500    Visit Number 4   Number of Visits 8   Date for OT Re-Evaluation 08/19/15   OT Start Time 1402   OT Stop Time 1450   OT Time Calculation (min) 48 min   Activity Tolerance Patient limited by pain   Behavior During Therapy [facility] for tasks assessed/performed      Past Medical History  Diagnosis Date  . Hypertension   . Arthritis     No past surgical history on file.  There were no vitals filed for this visit.  Visit Diagnosis:  Pain of right hand  Stiffness of joint, hand, right  Stiffness of wrist joint, right  Muscle weakness      Subjective Assessment - 08/10/15 1415    Subjective  My fingers over the knuckles swollen - comes and goes - they did approve my medicine and I wating to hear from them    Patient Stated Goals Get the swelling and pain better in my R hand and to be able to grip and pick up things again    Currently in Pain? Yes   Pain Score 3    Pain Location Hand   Pain Orientation Right   Pain Descriptors / Indicators Aching   Pain Type Chronic pain   Pain Onset More than a month ago                      OT Treatments/Exercises (OP) - 08/10/15 0001    Wrist Exercises   Other wrist exercises Hammer - or 1lbs at home for sup/pro; wrist flexion/ext/ RD and UD    Hand Exercises   Other Hand Exercises Putty light blue - for gripping, rolling and pinch alternate finger tips - 15 reps - and rolling pinch 3 x thru    Other Hand Exercises PROM of PIP 's , composite flexion of all digits until sligth pull, MC flexion , intrinsic fist, place nad hold    RUE  Contrast Bath   Time 12 minutes   Comments SOC and digits into flexion during 2nd and 4rd heat cycle    Manual Therapy   Manual therapy comments Soft tissue mobs ,MC spread and  to lateral bands of  PIP's prior to ROM but after  contrast                OT Education - 08/10/15 1500    Education provided Yes   Education Details HEP,built up handles  - heat decrease pain - she did not had pain after contrast    Person(s) Educated Patient;Child(ren)   Methods Explanation;Demonstration;Tactile cues;Verbal cues;Handout   Comprehension Verbal cues required;Returned demonstration;Verbalized understanding          OT Short Term Goals - 07/22/15 1210    OT SHORT TERM GOAL #1   Title Pt. will be indep. with HEP to increase AROM in all digts in R hand to increase function on PRWHE by 5-10 points   Baseline PIP's 60-70 flexion , Function on PRWHE 18.5/50   Time 3   Period Weeks   Status New   OT SHORT TERM  GOAL #2   Title Pt. will have improvement with pain by 10 points on PRWHE to increase use in ADL's and IADL's   Baseline Pain on PRWHE 19/50   Time 3   Period Weeks   Status New           OT Long Term Goals - 07/22/15 1213    OT LONG TERM GOAL #1   Title Grip strength in R and L hand improve with 10 lbs to carry 5 lbs weight/groceries and hold steering wheel   Baseline Grip on R 2 lbs , L 16 lbs    Time 4   Period Weeks   Status New   OT LONG TERM GOAL #2   Title Wrist AROM for wrist extention improve with 10 degrees to push up from chair with more ease    Baseline wrist extentionR 48 , L 60    Time 4   Period Weeks   Status New               Plan - 08/10/15 1503    Clinical Impression Statement Pt did get approved for new meds - still awaiting for it to be filled - persistance edema and pain - pain decrease after contrast or heat and PROM can get her t otouch palm - AROM of PIIP 's improved from 53 and 55 to 70 to 90  ins session  - pt to cont with HEP     Pt will benefit from skilled therapeutic intervention in order to improve on the following deficits (Retired) Decreased strength;Pain;Increased edema;Impaired flexibility;Decreased range of motion   Rehab Potential Good   OT Frequency 2x / week   OT Duration 2 weeks   OT Treatment/Interventions Self-care/ADL training;Contrast Bath;Fluidtherapy;Therapeutic exercise;Passive range of motion;Manual Therapy;Patient/family education   Plan Pain , ROM and edema    OT Home Exercise Plan see pt instruction   Consulted and Agree with Plan of Care Patient;Family member/caregiver        Problem List There are no active problems to display for this patient.   Rosalyn Gess OTR/L,CLT 08/10/2015, 3:06 PM  [facility] [address] [place], [address] Phone: [phone]   Fax:  [phone]

## 2015-08-10 NOTE — Patient Instructions (Signed)
Same for R hand  Putty for L  And then add wrist in all planes - 16oz hammer - holding close to head -  10 reps

## 2015-08-12 ENCOUNTER — Ambulatory Visit: Payer: Commercial Managed Care - HMO | Admitting: Occupational Therapy

## 2015-08-12 DIAGNOSIS — M79641 Pain in right hand: Secondary | ICD-10-CM | POA: Diagnosis not present

## 2015-08-12 DIAGNOSIS — M25641 Stiffness of right hand, not elsewhere classified: Secondary | ICD-10-CM | POA: Diagnosis not present

## 2015-08-12 DIAGNOSIS — M25631 Stiffness of right wrist, not elsewhere classified: Secondary | ICD-10-CM | POA: Diagnosis not present

## 2015-08-12 DIAGNOSIS — M6281 Muscle weakness (generalized): Secondary | ICD-10-CM

## 2015-08-12 NOTE — Therapy (Signed)
[place] [facility] [address] [place], [address] Phone: [phone]   Fax:  [phone]  Occupational Therapy Treatment  Patient Details  Name: Victoria Esparza MRN: [id number] Date of Birth: [birth year] Referring Provider:  Emmaline Kluver.,*  Encounter Date: 08/12/2015      OT End of Session - 08/12/15 1055    Visit Number 5   Number of Visits 8   Date for OT Re-Evaluation 08/19/15   OT Start Time 1008   OT Stop Time 1040   OT Time Calculation (min) 32 min   Activity Tolerance Patient limited by pain   Behavior During Therapy [facility] for tasks assessed/performed      Past Medical History  Diagnosis Date  . Hypertension   . Arthritis     No past surgical history on file.  There were no vitals filed for this visit.  Visit Diagnosis:  Pain of right hand  Stiffness of joint, hand, right  Stiffness of wrist joint, right  Muscle weakness      Subjective Assessment - 08/12/15 1022    Subjective  I am just not feeling good this am - little wobbly and breathing harder today - but I think it is my allergies -my nose feel stuffed up - did not teall my daughter   Patient Stated Goals Get the swelling and pain better in my R hand and to be able to grip and pick up things again    Currently in Pain? Yes   Pain Score 5    Pain Location Hand   Pain Orientation Right   Pain Descriptors / Indicators Aching                      OT Treatments/Exercises (OP) - 08/12/15 0001    Hand Exercises   Other Hand Exercises Cont with light blue putty for L hand - add new to R if not increasing pain    Other Hand Exercises PROM of PIP flexion , prolonged gentle stretch  - PROM composite flexion PROM , place and hold full fist - add light blue putty for end grip  and pinching  alternating  digits    RUE Fluidotherapy   Number Minutes Fluidotherapy 13 Minutes   RUE Fluidotherapy Location Hand;Wrist   Comments AROM of diigits and squeezing foam block  at end - pain decrease to 3/10   Manual Therapy   Manual therapy comments Soft tissue mobs ,MC spread and  to lateral bands of  PIP's prior to ROM but after  contrast                OT Education - 08/12/15 1055    Education Details HEP   Person(s) Educated Patient;Child(ren)   Methods Explanation;Demonstration;Tactile cues   Comprehension Verbalized understanding;Returned demonstration;Verbal cues required          OT Short Term Goals - 08/12/15 1112    OT SHORT TERM GOAL #1   Title Pt. will be indep. with HEP to increase AROM in all digts in R hand to increase function on PRWHE by 5-10 points   Baseline PIP's 60-70 flexion , Function on PRWHE 18.5/50   Time 1   Period Weeks   Status New   OT SHORT TERM GOAL #2   Title Pt. will have improvement with pain by 10 points on PRWHE to increase use in ADL's and IADL's   Baseline Pain on PRWHE 19/50  Time 1   Period Weeks   Status On-going           OT Long Term Goals - 08/12/15 1112    OT LONG TERM GOAL #1   Title Grip strength in R and L hand improve with 10 lbs to carry 5 lbs weight/groceries and hold steering wheel   Baseline Grip on R 2 lbs , L 16 lbs    Time 1   Period Weeks   Status On-going   OT LONG TERM GOAL #2   Title Wrist AROM for wrist extention improve with 10 degrees to push up from chair with more ease    Baseline wrist extentionR 48 , L 60    Time 1   Period Weeks   Status On-going               Plan - 08/12/15 1056    Clinical Impression Statement Pt did not feel good this date - some allergiies - not breathing well- O2 sats was 96% and HR 84 - treatment was cut short this date - pt show improvement in ROM after heat and decreaes pain - but still awaitng new meds to start    Pt will benefit from skilled therapeutic intervention in order to improve on the following deficits (Retired) Decreased strength;Pain;Increased edema;Impaired  flexibility;Decreased range of motion   Rehab Potential Good   OT Frequency 1x / week   OT Duration 2 weeks   OT Treatment/Interventions Self-care/ADL training;Contrast Bath;Fluidtherapy;Therapeutic exercise;Passive range of motion;Manual Therapy;Patient/family education   Plan Pain , ROM - HEP and if started new meds   OT Home Exercise Plan see pt instruction   Consulted and Agree with Plan of Care Patient;Family member/caregiver        Problem List There are no active problems to display for this patient.   Rosalyn Gess OTR/L,CLT 08/12/2015, 11:14 AM  [facility] [address] [place], [address] Phone: [phone]   Fax:  [phone]

## 2015-08-12 NOTE — Patient Instructions (Signed)
Same but can add light blue putty for grip end range and 2 point pinch alternating digits  For R hand  All ready doing it for L hand

## 2015-08-17 ENCOUNTER — Ambulatory Visit: Payer: Commercial Managed Care - HMO | Admitting: Occupational Therapy

## 2015-08-17 DIAGNOSIS — M25641 Stiffness of right hand, not elsewhere classified: Secondary | ICD-10-CM | POA: Diagnosis not present

## 2015-08-17 DIAGNOSIS — M79641 Pain in right hand: Secondary | ICD-10-CM | POA: Diagnosis not present

## 2015-08-17 DIAGNOSIS — M6281 Muscle weakness (generalized): Secondary | ICD-10-CM

## 2015-08-17 DIAGNOSIS — M25631 Stiffness of right wrist, not elsewhere classified: Secondary | ICD-10-CM | POA: Diagnosis not present

## 2015-08-17 NOTE — Therapy (Signed)
[place] [facility] [address] [place], [address] Phone: [phone]   Fax:  [phone]  Occupational Therapy Treatment and Discharge  Patient Details  Name: Victoria Esparza MRN: [id number] Date of Birth: 11/24/1928 Referring Provider:  Emmaline Kluver.,*  Encounter Date: 08/17/2015      OT End of Session - 08/17/15 1433    Visit Number 6   Number of Visits 8   Date for OT Re-Evaluation 08/19/15   OT Start Time 1497   OT Stop Time 1433   OT Time Calculation (min) 44 min   Activity Tolerance Patient limited by pain   Behavior During Therapy [facility] for tasks assessed/performed      Past Medical History  Diagnosis Date  . Hypertension   . Arthritis     No past surgical history on file.  There were no vitals filed for this visit.  Visit Diagnosis:  Pain of right hand  Stiffness of joint, hand, right  Stiffness of wrist joint, right  Muscle weakness      Subjective Assessment - 08/17/15 1349    Subjective  I don't know if you going to bre able to do anything - my knucles  is swollen and red - hand hurting - still did not get my medicine - and my my nose stuffy   Patient Stated Goals Get the swelling and pain better in my R hand and to be able to grip and pick up things again    Currently in Pain? Yes   Pain Score 4    Pain Location Hand   Pain Orientation Right   Pain Descriptors / Indicators Aching   Pain Type Chronic pain   Pain Onset More than a month ago            [facility] OT Assessment - 08/17/15 0001    Strength   Right Hand Grip (lbs) 10   Right Hand Lateral Pinch 7 lbs   Right Hand 3 Point Pinch 6 lbs   Left Hand Grip (lbs) 16   Left Hand Lateral Pinch 9 lbs   Left Hand 3 Point Pinch 5 lbs   Right Hand AROM   R Index  MCP 0-90 90 Degrees   R Index PIP 0-100 90 Degrees   R Long  MCP 0-90 90 Degrees   R Long PIP 0-100 85 Degrees   R Ring  MCP 0-90 90 Degrees   R Ring PIP  0-100 85 Degrees   R Little  MCP 0-90 90 Degrees   R Little PIP 0-100 80 Degrees                  OT Treatments/Exercises (OP) - 08/17/15 0001    Wrist Exercises   Other wrist exercises measurements taken for discharge of hand and grip/rhension    Hand Exercises   Other Hand Exercises Cont with light blue putty for L hand - add new to R if not increasing pain    Other Hand Exercises PROM of PIP flexion , prolonged gentle stretch  - PROM composite flexion PROM , place and hold full fist - add light blue putty for end grip  and pinching  alternating  digits    RUE Contrast Bath   Time 12 minutes   Comments SOC to decrease pain and increase ROM - pain decrease to 0/10   Manual Therapy   Manual therapy comments Soft tissue mobs ,MC spread and  to  lateral bands of  PIP's with gentle traction -  prior to ROM but after  contrast                OT Education - 08/17/15 1433    Education provided Yes   Education Details HEP   Person(s) Educated Patient   Methods Explanation;Demonstration;Tactile cues   Comprehension Verbalized understanding;Returned demonstration;Verbal cues required          OT Short Term Goals - 08/17/15 1436    OT SHORT TERM GOAL #1   Title Pt. will be indep. with HEP to increase AROM in all digts in R hand to increase function on PRWHE by 5-10 points   Baseline MC's improve to 90, PIP improve to 80-90 degrees - function improve to 12/50   Status Partially Met   OT SHORT TERM GOAL #2   Title Pt. will have improvement with pain by 10 points on PRWHE to increase use in ADL's and IADL's   Baseline PRWHE pain still about 23/50    Status Not Met           OT Long Term Goals - 08/17/15 1437    OT LONG TERM GOAL #1   Title Grip strength in R and L hand improve with 10 lbs to carry 5 lbs weight/groceries and hold steering wheel   Baseline R grip improve from 2 - 10 lbs - L still 16 lbs    Status Partially Met   OT LONG TERM GOAL #2   Title Wrist  AROM for wrist extention improve with 10 degrees to push up from chair with more ease    Baseline Wrist extention WNL - push up easy   Status Achieved               Plan - 08/17/15 1434    Clinical Impression Statement Pt made great progress in ROM of digits, grip and prhension in R hand but still pain, edema and stiffness - pt still waiting for her medicine - was approve but did not hear anyting about it further - functional use increase but pain on [employer] about the same - pt discharge with HEPto cont with and to do when starting taking medicine    Rehab Potential Good   OT Treatment/Interventions Self-care/ADL training;Contrast Bath;Fluidtherapy;Therapeutic exercise;Passive range of motion;Manual Therapy;Patient/family education   Plan Dicharge with HEP   OT Home Exercise Plan see pt instruction   Consulted and Agree with Plan of Care Patient;Family member/caregiver        Problem List There are no active problems to display for this patient.   Rosalyn Gess OTR/L,CLT 08/17/2015, 2:38 PM  [place] [facility] [address], [place], [address] Phone: [phone]   Fax:  [phone]

## 2015-08-17 NOTE — Patient Instructions (Signed)
Cont with contrast, composite PROM flexion digits Tendon glides Light blue putty for grip if not increase pain

## 2015-08-19 ENCOUNTER — Encounter: Payer: Commercial Managed Care - HMO | Admitting: Occupational Therapy

## 2015-08-26 ENCOUNTER — Ambulatory Visit: Payer: Self-pay

## 2015-08-27 DIAGNOSIS — I499 Cardiac arrhythmia, unspecified: Secondary | ICD-10-CM | POA: Diagnosis not present

## 2015-08-27 DIAGNOSIS — R05 Cough: Secondary | ICD-10-CM | POA: Diagnosis not present

## 2015-08-28 DIAGNOSIS — R05 Cough: Secondary | ICD-10-CM | POA: Diagnosis not present

## 2015-08-28 DIAGNOSIS — J449 Chronic obstructive pulmonary disease, unspecified: Secondary | ICD-10-CM | POA: Diagnosis not present

## 2015-08-31 DIAGNOSIS — I480 Paroxysmal atrial fibrillation: Secondary | ICD-10-CM | POA: Insufficient documentation

## 2015-08-31 DIAGNOSIS — I10 Essential (primary) hypertension: Secondary | ICD-10-CM | POA: Diagnosis not present

## 2015-09-02 ENCOUNTER — Ambulatory Visit: Payer: Self-pay

## 2015-09-02 ENCOUNTER — Ambulatory Visit: Payer: Commercial Managed Care - HMO | Attending: Rheumatology

## 2015-09-02 DIAGNOSIS — R531 Weakness: Secondary | ICD-10-CM | POA: Diagnosis not present

## 2015-09-02 DIAGNOSIS — Z9181 History of falling: Secondary | ICD-10-CM

## 2015-09-02 DIAGNOSIS — R262 Difficulty in walking, not elsewhere classified: Secondary | ICD-10-CM | POA: Diagnosis not present

## 2015-09-02 NOTE — Patient Instructions (Signed)
Patient needed max verbal, visual, tactile cues for proper sequencing with SPC with gait.

## 2015-09-02 NOTE — Therapy (Signed)
[place] [facility] [address], [place], [address] Phone: [phone]   Fax:  [phone]  Physical Therapy Evaluation  Patient Details  Name: Victoria Esparza MRN: [id number] Date of Birth: Sep 11, 1928 Referring Provider:  Emmaline Kluver.,*  Encounter Date: 09/02/2015      PT End of Session - 09/02/15 1313    Visit Number 1   Number of Visits 9   Date for PT Re-Evaluation 10/01/15   Authorization Type 1   Authorization Time Period of 10   PT Start Time 1313   PT Stop Time 1409   PT Time Calculation (min) 56 min   Equipment Utilized During Treatment Gait belt  SPC   Activity Tolerance Patient tolerated treatment well   Behavior During Therapy [facility] for tasks assessed/performed      Past Medical History  Diagnosis Date  . Hypertension   . Arthritis     No past surgical history on file.  Filed Vitals:   09/02/15 1347  SpO2: 96%    Visit Diagnosis:  Risk for falls - Plan: PT plan of care cert/re-cert  Weakness - Plan: PT plan of care cert/re-cert  Difficulty walking - Plan: PT plan of care cert/re-cert      Subjective Assessment - 09/02/15 1317    Subjective Pt states that her low back gives her a little bit of problems but a little bit. Not bothering her currently (pt sitting). Back bothers her when she stands up most of the time (5/10 pain).    Pertinent History Pt states having balance difficulties for about 5 years. Feels like both feet have numbness and burning (started after balance problems). Her MD stated that she had neuropathy. Has difficulty sleeping secondary to increased LE burning sensation plantar suface of her feet.  Pt states she feels that the issues of her feet may play a factor in her balance problems.  Pt state that she sometimes walks by herself short distances without AD but holding onto furniture. Does not use any AD. Does not leave home without assistance from another  person for safety.  Currently has difficulty with  walking. Has not had PT for her balance challenges yet.  Currently lives in a one story home without steps to enter.  Pt states that she also had pneumonia about 2 weeks ago. Currently being followed by a PA. Feels better but still has some problems breathing.    Patient Stated Goals Pt states wanting to be able to walk better.    Currently in Pain? No/denies   Multiple Pain Sites No            [facility] PT Assessment - 09/02/15 1315    Assessment   Onset Date/Surgical Date --  5 years ago. 2011   Prior Therapy No known physical therapy for current condition   Precautions   Precaution Comments Fall risk   Restrictions   Other Position/Activity Restrictions No known restrictions   Balance Screen   Has the patient fallen in the past 6 months No   Has the patient had a decrease in activity level because of a fear of falling?  No  But pt states having a fear of falling.    Is the patient reluctant to leave their home because of a fear of falling?  No   Prior Function   Vocation Retired   Biomedical scientist PLOF: Better able to walk   Posture/Postural Control   Posture Comments [place].  R side bend in sitting. Standing: R iliac rest higher, R low back side bend from L5 to around L2. L low back side bend from around L 2 to lower thoracic spine, L rotated with flexion mid thoracic spine. Bilateral foot pronation.    Strength   Right Hip Flexion 4-/5   Right Hip External Rotation  4/5   Right Hip ABduction 4/5  seated clamshell (hips < 90 degrees flexion)    Left Hip Flexion 4-/5   Left Hip External Rotation 4/5   Left Hip ABduction 4/5  seated clamshell (hips < 90 degrees flexion)    Right Knee Flexion 4/5   Right Knee Extension 4+/5   Left Knee Flexion 4/5   Left Knee Extension 4+/5   Right Ankle Dorsiflexion 4+/5   Right Ankle Plantar Flexion 4+/5   Left Ankle Dorsiflexion 4+/5   Left Ankle Plantar Flexion 4+/5   Palpation    Palpation comment Swelling R dorsal foot   Ambulation/Gait   Gait Comments L hand held assist, antalgic, decreased stance R LE, forward flexed, decreased bilateral hip extension   Berg Balance Test   Sit to Stand Able to stand  independently using hands   Standing Unsupported Able to stand safely 2 minutes   Sitting with Back Unsupported but Feet Supported on Floor or Stool Able to sit safely and securely 2 minutes   Stand to Sit Controls descent by using hands   Transfers Able to transfer safely, definite need of hands   Standing Unsupported with Eyes Closed Able to stand 10 seconds safely   Standing Ubsupported with Feet Together Able to place feet together independently and stand for 1 minute with supervision   From Standing, Reach Forward with Outstretched Arm Reaches forward but needs supervision   From Standing Position, Pick up Object from [place] to pick up shoe safely and easily   From Standing Position, Turn to Look Behind Over each Shoulder Looks behind one side only/other side shows less weight shift   Turn 360 Degrees Able to turn 360 degrees safely but slowly   Standing Unsupported, Alternately Place Feet on Step/Stool Needs assistance to keep from falling or unable to try   Standing Unsupported, One Foot in Front Needs help to step but can hold 15 seconds   Standing on One Leg Unable to try or needs assist to prevent fall   Total Score 35   Berg comment: increased fall risk; suggests need for use of assistive device           [facility] Adult PT Treatment/Exercise - 09/02/15 1315    Exercises   Other Exercises  Gait training with SPC on L side about 100 ft. Pt demonstrated difficulty with sequencing and needed cues for increasing base of support bilateral feet.           PT Education - 09/02/15 2114    Education provided Yes   Education Details ther-ex (walking with [facility]), plan of care   Person(s) Educated Patient   Methods Explanation;Demonstration;Tactile  cues;Verbal cues   Comprehension Verbalized understanding;Verbal cues required;Tactile cues required;Need further instruction             PT Long Term Goals - 09/02/15 2110    PT LONG TERM GOAL #1   Title Patient will be independent with her HEP to promote LE strength and balance.    Time 4   Period Weeks   Status New   PT LONG TERM GOAL #2   Title Patient  will improve bilateral LE strength to promote ability to perform standing tasks and ambulate.    Time 4   Period Weeks   Status New   PT LONG TERM GOAL #3   Title Patient will improve BERG blance score to at least 46/56 as a demonstration of improved balance.             Plan - 04-Sep-2015 03-21-2104    Clinical Impression Statement Patient is an 81 year old female who came to physical therapy secondary to balance difficulties and LE weakness. She also presents with altered gait pattern and posture, a score of 35/56 for BERG balance scale suggesting increased risk for falls and need for use of assistive device. Patient also presents with difficulty walking. Patient will benefit from skilled physical therapy services to address the aforementioned defictis.    Pt will benefit from skilled therapeutic intervention in order to improve on the following deficits Decreased strength;Decreased balance;Difficulty walking   Rehab Potential Good   Clinical Impairments Affecting Rehab Potential age, chronicity of condition   PT Frequency 2x / week   PT Duration 4 weeks   PT Treatment/Interventions Manual techniques;Therapeutic exercise;Therapeutic activities;Gait training;Neuromuscular re-education   PT Next Visit Plan balance activities, LE strengthening   Consulted and Agree with Plan of Care Patient          G-Codes - 09-04-15 03/21/01    Functional Assessment Tool Used clinical presentation, patient interview   Functional Limitation Mobility: Walking and moving around   Mobility: Walking and Moving Around Current Status ([id number]) At least  40 percent but less than 60 percent impaired, limited or restricted   Mobility: Walking and Moving Around Goal Status [phone]) At least 20 percent but less than 40 percent impaired, limited or restricted       Problem List There are no active problems to display for this patient.   Joneen Boers PT, DPT   09/03/2015, 6:48 PM  [place] [facility] [address], [place], [address] Phone: [phone]   Fax:  [phone]

## 2015-09-07 ENCOUNTER — Ambulatory Visit: Payer: Commercial Managed Care - HMO

## 2015-09-07 DIAGNOSIS — R531 Weakness: Secondary | ICD-10-CM

## 2015-09-07 DIAGNOSIS — Z9181 History of falling: Secondary | ICD-10-CM | POA: Diagnosis not present

## 2015-09-07 DIAGNOSIS — Z79899 Other long term (current) drug therapy: Secondary | ICD-10-CM | POA: Diagnosis not present

## 2015-09-07 DIAGNOSIS — R262 Difficulty in walking, not elsewhere classified: Secondary | ICD-10-CM | POA: Diagnosis not present

## 2015-09-07 DIAGNOSIS — M0609 Rheumatoid arthritis without rheumatoid factor, multiple sites: Secondary | ICD-10-CM | POA: Diagnosis not present

## 2015-09-07 NOTE — Therapy (Signed)
[place] [facility] [address], [place], [address] Phone: [phone]   Fax:  [phone]  Physical Therapy Treatment  Patient Details  Name: Victoria Esparza MRN: [id number] Date of Birth: 05-24-1928 Referring Samson Ralph:  Emmaline Kluver.,*  Encounter Date: 09/07/2015      PT End of Session - 09/07/15 1309    Visit Number 2   Number of Visits 9   Date for PT Re-Evaluation 10/01/15   Authorization Type 2   Authorization Time Period of 10   PT Start Time 1300   PT Stop Time 1345   PT Time Calculation (min) 45 min   Equipment Utilized During Treatment Gait belt  SPC   Activity Tolerance Patient tolerated treatment well   Behavior During Therapy [facility] for tasks assessed/performed      Past Medical History  Diagnosis Date  . Hypertension   . Arthritis     No past surgical history on file.  There were no vitals filed for this visit.  Visit Diagnosis:  Risk for falls  Weakness  Difficulty walking      Subjective Assessment - 09/07/15 1258    Subjective Pt states that her balance started bothering her 3 months ago when she had a UTI. No falls recently. Both feet feel like they are burning (present prior to UTI).    Pertinent History Pt states having balance difficulties for about 5 years. Feels like both feet have numbness and burning (started after balance problems). Her MD stated that she had neuropathy. Has difficulty sleeping secondary to increased LE burning sensation plantar suface of her feet.  Pt states she feels that the issues of her feet may play a factor in her balance problems.  Pt state that she sometimes walks by herself short distances without AD but holding onto furniture. Does not use any AD. Does not leave home without assistance from another person for safety.  Currently has difficulty with  walking. Has not had PT for her balance challenges yet.  Currently lives in a one story home  without steps to enter.  Pt states that she also had pneumonia about 2 weeks ago. Currently being followe by a PA. Feels better but still has some problems breathing.    Patient Stated Goals Pt states wanting to be able to walk better.    Currently in Pain? Yes   Pain Score 5   burning bilateral feet        There-ex:  Directed patient with gait hand held assist 75 ft,  Standing hip abduction bilateral UE assist 10x3,  SPO2: 96% room air.   Directed patient with gait with SPC on L side 50 ft, and throughout gym. (Improved sequencing with SPC with increased practice),   Standing hip extension 10x2 each LE and bilateral UE assist,   Sit <> stand from low mat table or regular chair throughout session (cues for bringing center of gravity over base of support),   Forward step up and over Air Ex pad 4x with 1 UE assist,   Gait 75 ft using SPC L side.     CGA with gait with SPC.   Improved exercise and gait technique, movement at target joints, use of target muscles after mod to max verbal, visual, tactile cues.       Difficulty with sequencing with SPC but improves with practice. Demonstrates fatigue with gait between 75 ft to 100 ft.  PT Education - 09/07/15 1322    Education provided Yes   Education Details ther-ex, gait with SPC on L    Person(s) Educated Patient   Methods Explanation;Demonstration;Tactile cues;Verbal cues   Comprehension Verbalized understanding;Returned demonstration;Verbal cues required;Tactile cues required;Need further instruction             PT Long Term Goals - 09/02/15 2110    PT LONG TERM GOAL #1   Title Patient will be independent with her HEP to promote LE strength and balance.    Time 4   Period Weeks   Status New   PT LONG TERM GOAL #2   Title Patient will improve bilateral LE strength to promote ability to perform standing tasks and ambulate.    Time 4   Period Weeks   Status New   PT LONG TERM GOAL  #3   Title Patient will improve BERG blance score to at least 46/56 as a demonstration of improved balance.                Plan - 09/07/15 1309    Clinical Impression Statement Difficulty with sequencing with SPC but improves with practice. Demonstrates fatigue with gait using SPC between 75 ft to 100 ft.    Pt will benefit from skilled therapeutic intervention in order to improve on the following deficits Decreased strength;Decreased balance;Difficulty walking   Rehab Potential Good   Clinical Impairments Affecting Rehab Potential age, chronicity of condition   PT Frequency 2x / week   PT Duration 4 weeks   PT Treatment/Interventions Manual techniques;Therapeutic exercise;Therapeutic activities;Gait training;Neuromuscular re-education   PT Next Visit Plan balance activities, LE strengthening   Consulted and Agree with Plan of Care Patient        Problem List There are no active problems to display for this patient.   Joneen Boers PT, DPT   09/07/2015, 2:47 PM  [place] [facility] [address] [place], [address] Phone: [phone]   Fax:  [phone]

## 2015-09-08 DIAGNOSIS — I10 Essential (primary) hypertension: Secondary | ICD-10-CM | POA: Insufficient documentation

## 2015-09-09 ENCOUNTER — Ambulatory Visit: Payer: Commercial Managed Care - HMO

## 2015-09-09 VITALS — BP 127/65 | HR 78

## 2015-09-09 DIAGNOSIS — Z9181 History of falling: Secondary | ICD-10-CM | POA: Diagnosis not present

## 2015-09-09 DIAGNOSIS — R531 Weakness: Secondary | ICD-10-CM

## 2015-09-09 DIAGNOSIS — R262 Difficulty in walking, not elsewhere classified: Secondary | ICD-10-CM

## 2015-09-09 NOTE — Therapy (Signed)
[place] [facility] [address] [place], [address] Phone: [phone]   Fax:  [phone]  Physical Therapy Treatment  Patient Details  Name: Victoria Esparza MRN: [id number] Date of Birth: [birth year] Referring Provider:  Emmaline Kluver.,*  Encounter Date: 09/09/2015      PT End of Session - 09/09/15 1320    Visit Number 3   Number of Visits 9   Date for PT Re-Evaluation 10/01/15   Authorization Type 3   Authorization Time Period of 10   PT Start Time 1302   PT Stop Time 1354   PT Time Calculation (min) 52 min   Equipment Utilized During Treatment Gait belt  SPC   Activity Tolerance Patient tolerated treatment well   Behavior During Therapy [facility] for tasks assessed/performed      Past Medical History  Diagnosis Date  . Hypertension   . Arthritis     No past surgical history on file.  Filed Vitals:   09/09/15 1313  BP: 127/65  Pulse: 78    Visit Diagnosis:  Risk for falls  Weakness  Difficulty walking      Subjective Assessment - 09/09/15 1312    Subjective Pt states that the burning in her feet are not bad. 5/10. Also has swelling on her R foot.    Pertinent History Pt states having balance difficulties for about 5 years. Feels like both feet have numbness and burning (started after balance problems). Her MD stated that she had neuropathy. Has difficulty sleeping secondary to increased LE burning sensation plantar suface of her feet.  Pt states she feels that the issues of her feet may play a factor in her balance problems.  Pt state that she sometimes walks by herself short distances without AD but holding onto furniture. Does not use any AD. Does not leave home without assistance from another person for safety.  Currently has difficulty with  walking. Has not had PT for her balance challenges yet.  Currently lives in a one story home without steps to enter.  Pt states that she also had  pneumonia about 2 weeks ago. Currently being followe by a PA. Feels better but still has some problems breathing.    Patient Stated Goals Pt states wanting to be able to walk better.    Currently in Pain? Yes   Pain Score 5    Multiple Pain Sites No      Objectives:  There-ex:  Pt and daugther education that pt not yet ready to transition to [facility] yet secondary to difficulty sequencing and safety. Pt however to use her rw at home for safety. Pt daughter and mother verbalized understanding.    Directed patient with standing heel/toe raises with bialteral UE assist 10x2 each way,  Gait with SPC on L 20 ft with L turn,   Sit <> stand from low mat table 10x2,   Supine bridge 5x3 for glute max strengthening,    Directed patient with gait with SPC 50 ft CGA, max cues for sequencing.    Pt and daughter education and recommnedation for arch supports to help decrease R foot swelling (R foot pronation with increased anterior lateral ankle pressure with sit <> stand per observation).    Manual therapy:   [place] R foot elevated in supine to decrease dorsal foot swelling. (Decreased swelling after therapy)                   PT  Education - 09/09/15 1320    Education provided Yes   Education Details ther-ex, gait with SPC on L   Person(s) Educated Patient   Methods Explanation;Demonstration;Tactile cues;Verbal cues   Comprehension Verbalized understanding;Returned demonstration;Verbal cues required;Tactile cues required;Need further instruction             PT Long Term Goals - 09/02/15 2110    PT LONG TERM GOAL #1   Title Patient will be independent with her HEP to promote LE strength and balance.    Time 4   Period Weeks   Status New   PT LONG TERM GOAL #2   Title Patient will improve bilateral LE strength to promote ability to perform standing tasks and ambulate.    Time 4   Period Weeks   Status New   PT LONG TERM GOAL #3   Title Patient will improve  BERG blance score to at least 46/56 as a demonstration of improved balance.                Plan - 09/09/15 1321    Clinical Impression Statement R foot pronation with increased anterior lateral ankle pressure with sit <> stand per observation. Recommended arch supports to help decrease swelling at dorsum of R foot. Needed max cues for sequencing with gait with SPC with some improvement with practice.    Pt will benefit from skilled therapeutic intervention in order to improve on the following deficits Decreased strength;Decreased balance;Difficulty walking   Rehab Potential Good   Clinical Impairments Affecting Rehab Potential age, chronicity of condition   PT Frequency 2x / week   PT Duration 4 weeks   PT Treatment/Interventions Manual techniques;Therapeutic exercise;Therapeutic activities;Gait training;Neuromuscular re-education   PT Next Visit Plan balance activities, LE strengthening   Consulted and Agree with Plan of Care Patient        Problem List There are no active problems to display for this patient.  Joneen Boers PT, DPT   09/09/2015, 5:48 PM  [place] [facility] [address], [place], [address] Phone: [phone]   Fax:  [phone]

## 2015-09-10 DIAGNOSIS — R0981 Nasal congestion: Secondary | ICD-10-CM | POA: Diagnosis not present

## 2015-09-14 ENCOUNTER — Ambulatory Visit: Payer: Commercial Managed Care - HMO

## 2015-09-14 ENCOUNTER — Telehealth: Payer: Self-pay

## 2015-09-14 NOTE — Telephone Encounter (Signed)
No show. Called pt phone number provided. No answer. Unable to leave message secondary to voice mailbox not set up yet.

## 2015-09-14 NOTE — Telephone Encounter (Signed)
Called pt and was able to get in touch with her. Pt stated forgetting about today's session but with try her best to come to her next follow up appointment.

## 2015-09-16 ENCOUNTER — Ambulatory Visit: Payer: Commercial Managed Care - HMO

## 2015-09-16 DIAGNOSIS — R531 Weakness: Secondary | ICD-10-CM

## 2015-09-16 DIAGNOSIS — R262 Difficulty in walking, not elsewhere classified: Secondary | ICD-10-CM | POA: Diagnosis not present

## 2015-09-16 DIAGNOSIS — Z9181 History of falling: Secondary | ICD-10-CM

## 2015-09-16 NOTE — Therapy (Signed)
[facility] [address], [place], [address] Phone: [phone]   Fax:  [phone]  Physical Therapy Treatment  Patient Details  Name: Victoria Esparza MRN: [id number] Date of Birth: Aug 01, 1928 Referring Provider:  Emmaline Kluver.,*  Encounter Date: 09/16/2015      PT End of Session - 09/16/15 1304    Visit Number 4   Number of Visits 9   Date for PT Re-Evaluation 10/01/15   Authorization Type 4   Authorization Time Period of 10   PT Start Time 1304   PT Stop Time 1357   PT Time Calculation (min) 53 min   Equipment Utilized During Treatment Gait belt  SPC   Activity Tolerance Patient tolerated treatment well   Behavior During Therapy [facility] for tasks assessed/performed      Past Medical History  Diagnosis Date  . Hypertension   . Arthritis     No past surgical history on file.  There were no vitals filed for this visit.  Visit Diagnosis:  Risk for falls  Weakness  Difficulty walking      Subjective Assessment - 09/16/15 1314    Subjective Pt states that she thinks that her balance is better. Feels 6/10 burning both feet today. Uses vitamin B12 and rubbing alcohol to help with her feet burning.    Pertinent History Pt states having balance difficulties for about 5 years. Feels like both feet have numbness and burning (started after balance problems). Her MD stated that she had neuropathy. Has difficulty sleeping secondary to increased LE burning sensation plantar suface of her feet.  Pt states she feels that the issues of her feet may play a factor in her balance problems.  Pt state that she sometimes walks by herself short distances without AD but holding onto furniture. Does not use any AD. Does not leave home without assistance from another person for safety.  Currently has difficulty with  walking. Has not had PT for her balance challenges yet.  Currently lives in a one story home  without steps to enter.  Pt states that she also had pneumonia about 2 weeks ago. Currently being followe by a PA. Feels better but still has some problems breathing.    Patient Stated Goals Pt states wanting to be able to walk better.    Currently in Pain? Yes   Pain Score 6    Multiple Pain Sites No        Objectives:  There-ex:  Directed patient with sit <> stand from low mat table at chair height 10x,  Standing hip abduction with bilateral UE assist 10x each LE, Seated LE neural flossing 10x3 each LE,  Standing bilateral ankle DF/PF 2 min to promote ankle DF and neural mobility (no plantar feet burning sensation during exercise) Directed patient with gait with SPC 33 ft, 18 ft, 20 ft with min to mod verbal cues for sequencing, SpO2 95% room air after gait,  Nustep level 1 x 5 min (1 min cues for technique and pacing, 4 min no charge)   Improved exercise technique, movement at target joints, use of target muscles after mod verbal, visual, tactile cues.    Improved sequencing with gait using SPC on L compared to previous sessions. Some difficulty with endurance with gait with SPC needing rest breaks. Utilized Nustep machine at end of session to help with endurance.  PT Education - 09/16/15 1315    Education provided Yes   Education Details ther-ex   [employer]) Educated Patient   Methods Explanation;Demonstration;Tactile cues;Verbal cues   Comprehension Verbalized understanding;Returned demonstration;Tactile cues required;Verbal cues required             PT Long Term Goals - 09/02/15 2110    PT LONG TERM GOAL #1   Title Patient will be independent with her HEP to promote LE strength and balance.    Time 4   Period Weeks   Status New   PT LONG TERM GOAL #2   Title Patient will improve bilateral LE strength to promote ability to perform standing tasks and ambulate.    Time 4   Period Weeks   Status New   PT LONG TERM GOAL  #3   Title Patient will improve BERG blance score to at least 46/56 as a demonstration of improved balance.                Plan - 09/16/15 1322    Clinical Impression Statement Decreased bilateral plantar feet burning to 4/10  after slump test and LE neural flossing. Improved sequencing with gait using SPC on L compared to previous sessions. Some difficulty with endurance with gait with SPC needing rest breaks. Utilized Nustep machine at end of session to help with endurance.    Pt will benefit from skilled therapeutic intervention in order to improve on the following deficits Decreased strength;Decreased balance;Difficulty walking   Rehab Potential Good   Clinical Impairments Affecting Rehab Potential age, chronicity of condition   PT Frequency 2x / week   PT Duration 4 weeks   PT Treatment/Interventions Manual techniques;Therapeutic exercise;Therapeutic activities;Gait training;Neuromuscular re-education   PT Next Visit Plan balance activities, LE strengthening   Consulted and Agree with Plan of Care Patient        Problem List There are no active problems to display for this patient.  Joneen Boers PT, DPT  09/16/2015, 1:58 PM  [facility] [address], [place], [address] Phone: [phone]   Fax:  [phone]

## 2015-09-21 ENCOUNTER — Ambulatory Visit: Payer: Commercial Managed Care - HMO | Attending: Rheumatology

## 2015-09-21 DIAGNOSIS — R531 Weakness: Secondary | ICD-10-CM

## 2015-09-21 DIAGNOSIS — Z9181 History of falling: Secondary | ICD-10-CM | POA: Diagnosis not present

## 2015-09-21 DIAGNOSIS — L4050 Arthropathic psoriasis, unspecified: Secondary | ICD-10-CM | POA: Diagnosis not present

## 2015-09-21 DIAGNOSIS — G250 Essential tremor: Secondary | ICD-10-CM | POA: Diagnosis not present

## 2015-09-21 DIAGNOSIS — M810 Age-related osteoporosis without current pathological fracture: Secondary | ICD-10-CM | POA: Diagnosis not present

## 2015-09-21 DIAGNOSIS — R262 Difficulty in walking, not elsewhere classified: Secondary | ICD-10-CM | POA: Diagnosis not present

## 2015-09-21 DIAGNOSIS — M0609 Rheumatoid arthritis without rheumatoid factor, multiple sites: Secondary | ICD-10-CM | POA: Diagnosis not present

## 2015-09-21 NOTE — Therapy (Signed)
[facility] [address], [place], [address] Phone: [phone]   Fax:  [phone]  Physical Therapy Treatment  Patient Details  Name: Victoria Esparza MRN: [id number] Date of Birth: 1928-04-05 Referring Provider:  Emmaline Kluver.,*  Encounter Date: 09/21/2015      PT End of Session - 09/21/15 1306    Visit Number 5   Number of Visits 9   Date for PT Re-Evaluation 10/01/15   Authorization Type 5   Authorization Time Period of 10   PT Start Time 1301   PT Stop Time 1341   PT Time Calculation (min) 40 min   Equipment Utilized During Treatment Gait belt  SPC   Activity Tolerance Patient tolerated treatment well   Behavior During Therapy [facility] for tasks assessed/performed      Past Medical History  Diagnosis Date  . Hypertension   . Arthritis     No past surgical history on file.  There were no vitals filed for this visit.  Visit Diagnosis:  Risk for falls  Weakness  Difficulty walking      Subjective Assessment - 09/21/15 1307    Subjective Pt states that she has been walking all day. Might not be able to do the whole session today. The burning in her feet is much better. 4/10 currently.  Had an appoinment with Dr Jefm Bryant who said that she is doing better with her rheumaoid arthritis and might decrease her prednisone dose her next MD appointment (Nov 18, 2015). Pt also states that her balance feels better. Still unsteady but better.    Pertinent History Pt states having balance difficulties for about 5 years. Feels like both feet have numbness and burning (started after balance problems). Her MD stated that she had neuropathy. Has difficulty sleeping secondary to increased LE burning sensation plantar suface of her feet.  Pt states she feels that the issues of her feet may play a factor in her balance problems.  Pt state that she sometimes walks by herself short distances without AD but  holding onto furniture. Does not use any AD. Does not leave home without assistance from another person for safety.  Currently has difficulty with  walking. Has not had PT for her balance challenges yet.  Currently lives in a one story home without steps to enter.  Pt states that she also had pneumonia about 2 weeks ago. Currently being followe by a PA. Feels better but still has some problems breathing.    Patient Stated Goals Pt states wanting to be able to walk better.    Currently in Pain? Yes   Pain Score 4    Multiple Pain Sites No        Objectives:  There-ex:  Directed patient with sit <> stand from low mat table at chair height 10x,  Standing hip abduction with bilateral UE assist 10x each LE, Seated LE neural flossing 10x3 each LE,  Standing bilateral ankle DF/PF 2 min to promote ankle DF and neural mobility, Seated clamshells (hips less than 90 degrees flexion) resisting yellow band 10x Forward step ups onto Air Ex pad 5x each LE with one UE assist (cues for increasing hip and knee flexion for foot clearance and larger step to clear edge of Air Ex pad), Standing bilateral scapular retraction resisting red band 10x2,  Standing tandem stance 30 seconds each LE with L UE assist.      Improved exercise technique, movement at target  joints, use of target muscles after mod verbal, visual, tactile cues.     Patient making good progress with decreased burning sensation in her feet. Focused on strengthening and some balance activities today instead of gait training with [place] secondary to pt feeling tired. Gait with SPC is very challenging for her.            PT Education - 09/21/15 1316    Education provided Yes   Education Details ther-ex   [employer]) Educated Patient   Methods Explanation;Demonstration;Tactile cues;Verbal cues   Comprehension Verbalized understanding;Returned demonstration;Verbal cues required;Tactile cues required             PT Long Term  Goals - 09/02/15 2110    PT LONG TERM GOAL #1   Title Patient will be independent with her HEP to promote LE strength and balance.    Time 4   Period Weeks   Status New   PT LONG TERM GOAL #2   Title Patient will improve bilateral LE strength to promote ability to perform standing tasks and ambulate.    Time 4   Period Weeks   Status New   PT LONG TERM GOAL #3   Title Patient will improve BERG blance score to at least 46/56 as a demonstration of improved balance.                Plan - 09/21/15 1317    Clinical Impression Statement Patient making good progress with decreased burning sensation in her feet. Focused on strengthening and some balance activities today instead of gait training with [place] secondary to pt feeling tired today. Gait with SPC is very challenging for her.   Pt will benefit from skilled therapeutic intervention in order to improve on the following deficits Decreased strength;Decreased balance;Difficulty walking   Rehab Potential Good   Clinical Impairments Affecting Rehab Potential age, chronicity of condition   PT Frequency 2x / week   PT Duration 4 weeks   PT Treatment/Interventions Manual techniques;Therapeutic exercise;Therapeutic activities;Gait training;Neuromuscular re-education   PT Next Visit Plan balance activities, LE strengthening   Consulted and Agree with Plan of Care Patient        Problem List There are no active problems to display for this patient.  Joneen Boers PT, DPT   09/21/2015, 2:54 PM  [facility] [address] [place], [address] Phone: [phone]   Fax:  [phone]

## 2015-09-23 ENCOUNTER — Ambulatory Visit: Payer: Commercial Managed Care - HMO

## 2015-09-23 DIAGNOSIS — Z9181 History of falling: Secondary | ICD-10-CM | POA: Diagnosis not present

## 2015-09-23 DIAGNOSIS — R262 Difficulty in walking, not elsewhere classified: Secondary | ICD-10-CM

## 2015-09-23 DIAGNOSIS — R531 Weakness: Secondary | ICD-10-CM

## 2015-09-23 NOTE — Therapy (Signed)
[facility] [address] [place], [address] Phone: [phone]   Fax:  [phone]  Physical Therapy Treatment  Patient Details  Name: Victoria Esparza MRN: [id number] Date of Birth: April 03, 1928 Referring Provider:  Emmaline Kluver.,*  Encounter Date: 09/23/2015      PT End of Session - 09/23/15 1313    Visit Number 6   Number of Visits 9   Date for PT Re-Evaluation 10/01/15   Authorization Type 6   Authorization Time Period of 10   PT Start Time 1313  Pt arrived late   PT Stop Time 1358   PT Time Calculation (min) 45 min   Equipment Utilized During Treatment Gait belt  SPC   Activity Tolerance Patient tolerated treatment well   Behavior During Therapy [facility] for tasks assessed/performed      Past Medical History  Diagnosis Date  . Hypertension   . Arthritis     No past surgical history on file.  There were no vitals filed for this visit.  Visit Diagnosis:  Risk for falls  Weakness  Difficulty walking      Subjective Assessment - 09/23/15 1313    Subjective Pt daughter states pt moving pretty good today. Pt states burning in her feet are better. 2/10 currently.    Pertinent History Pt states having balance difficulties for about 5 years. Feels like both feet have numbness and burning (started after balance problems). Her MD stated that she had neuropathy. Has difficulty sleeping secondary to increased LE burning sensation plantar suface of her feet.  Pt states she feels that the issues of her feet may play a factor in her balance problems.  Pt state that she sometimes walks by herself short distances without AD but holding onto furniture. Does not use any AD. Does not leave home without assistance from another person for safety.  Currently has difficulty with  walking. Has not had PT for her balance challenges yet.  Currently lives in a one story home without steps to enter.  Pt states that  she also had pneumonia about 2 weeks ago. Currently being followe by a PA. Feels better but still has some problems breathing.    Patient Stated Goals Pt states wanting to be able to walk better.    Currently in Pain? Yes   Pain Score 2    Multiple Pain Sites No        Objectives:  There-ex:  Directed patient with seated LE neural flossing 10x3 each LE, Sit<> stand from low mat table at chair height 10x, Gait with SPC on L side 67 ft, and 30 ft with mod cues for proper sequencing, Standing bilateral ankle DF/PF 2 min to promote ankle DF and neural mobility, Forward step up and over Air Ex pad with one UE assist with mod verbal, and tactile cues to transfer body weight onto stance LE 5x2,  SLS with bilateral UE assist at treadmill bar to promote glute med strength 10x 2 seconds each LE Then with tip toe assist to help maintain steady pelvis and glute med strength 5x2 with bilateral UE assist as well,  Sit <> stand throughout session from regular chair with arms with cues for making sure pt is in front of chair prior to sitting, reaching back before sitting, and controlling her descent onto her chair throughout session,    Improved exercise technique, movement at target joints, use of target muscles after mod verbal, visual, tactile  cues.     Improved ability to ambulate with SPC with less cues for sequencing compared to previous sessions. Decreasing bilateral foot burning sensation with neural glide exercises.                           PT Education - 09/23/15 1559    Education provided Yes   Education Details ther-ex, gait   Person(s) Educated Patient   Methods Explanation;Demonstration;Tactile cues;Verbal cues   Comprehension Verbalized understanding;Returned demonstration;Verbal cues required;Tactile cues required             PT Long Term Goals - 09/02/15 2110    PT LONG TERM GOAL #1   Title Patient will be independent with her HEP to promote LE  strength and balance.    Time 4   Period Weeks   Status New   PT LONG TERM GOAL #2   Title Patient will improve bilateral LE strength to promote ability to perform standing tasks and ambulate.    Time 4   Period Weeks   Status New   PT LONG TERM GOAL #3   Title Patient will improve BERG blance score to at least 46/56 as a demonstration of improved balance.                Plan - 09/23/15 1559    Clinical Impression Statement Improved ability to ambulate with SPC with less cues for sequencing compared to previous sessions. Decreasing bilateral foot burning sensation with neural glide exercises.    Pt will benefit from skilled therapeutic intervention in order to improve on the following deficits Decreased strength;Decreased balance;Difficulty walking   Rehab Potential Good   Clinical Impairments Affecting Rehab Potential age, chronicity of condition   PT Frequency 2x / week   PT Duration 4 weeks   PT Treatment/Interventions Manual techniques;Therapeutic exercise;Therapeutic activities;Gait training;Neuromuscular re-education   PT Next Visit Plan balance activities, LE strengthening   Consulted and Agree with Plan of Care Patient        Problem List There are no active problems to display for this patient.   Joneen Boers PT, DPT   09/23/2015, 6:42 PM  [facility] [address] [place], [address] Phone: [phone]   Fax:  [phone]

## 2015-09-28 ENCOUNTER — Ambulatory Visit: Payer: Commercial Managed Care - HMO

## 2015-09-28 DIAGNOSIS — R262 Difficulty in walking, not elsewhere classified: Secondary | ICD-10-CM

## 2015-09-28 DIAGNOSIS — R531 Weakness: Secondary | ICD-10-CM

## 2015-09-28 DIAGNOSIS — Z9181 History of falling: Secondary | ICD-10-CM

## 2015-09-28 NOTE — Therapy (Signed)
[facility] [address], [place], [address] Phone: [phone]   Fax:  [phone]  Physical Therapy Treatment  Patient Details  Name: Victoria Esparza MRN: [id number] Date of Birth: 1928/12/11 Referring Provider:  Emmaline Kluver.,*  Encounter Date: 09/28/2015      PT End of Session - 09/28/15 1305    Visit Number 7   Number of Visits 9   Date for PT Re-Evaluation 10/01/15   Authorization Type 7   Authorization Time Period of 10   PT Start Time 1302   PT Stop Time 1348   PT Time Calculation (min) 46 min   Equipment Utilized During Treatment Gait belt  SPC   Activity Tolerance Patient tolerated treatment well   Behavior During Therapy [facility] for tasks assessed/performed      Past Medical History  Diagnosis Date  . Hypertension   . Arthritis     No past surgical history on file.  There were no vitals filed for this visit.  Visit Diagnosis:  Risk for falls  Weakness  Difficulty walking      Subjective Assessment - 09/28/15 1307    Subjective Pt states 2/10 burning bilateral feet currently. Does the seated LE neural floss exercise at home. Pt also states feeling tired today. Was tired yesterday after cleaning up from having company over at her home.    Pertinent History Pt states having balance difficulties for about 5 years. Feels like both feet have numbness and burning (started after balance problems). Her MD stated that she had neuropathy. Has difficulty sleeping secondary to increased LE burning sensation plantar suface of her feet.  Pt states she feels that the issues of her feet may play a factor in her balance problems.  Pt state that she sometimes walks by herself short distances without AD but holding onto furniture. Does not use any AD. Does not leave home without assistance from another person for safety.  Currently has difficulty with  walking. Has not had PT for her balance  challenges yet.  Currently lives in a one story home without steps to enter.  Pt states that she also had pneumonia about 2 weeks ago. Currently being followe by a PA. Feels better but still has some problems breathing.    Patient Stated Goals Pt states wanting to be able to walk better.    Currently in Pain? Yes   Pain Score 2   burning sensation bilateral feet.         Objectives:  There-ex:  Directed patient with seated LE neural flossing 10x3 each LE, Sit<> stand from low mat table at chair height 10x2, Gait with SPC on L side 51 ft, 50 ft, and 61 ft with min cues for proper sequencing.  Directed patient with stand pivot transfer from chair height mat table <> chair with arms 1x Standing with feet shoulder width apart x 2 min, with feet together 1 min, feet shoulder width apart but with eyes closed x 10 seconds, Standing tandem stance (feet about one foot length apart) x 30 seconds each LE, Turning 360 degrees 1x each direction, SLS, Forward reaching x 1, Looking back as far as she can 1x each direction, Picking up an object (computer mouse) from the floor 1x  Improved exercise technique, movement at target joints, use of target muscles after min to mod verbal, visual, tactile cues.   Reviewed current status with Berg Balance Score with pt.  Pt only  needed min verbal, cues for sequencing using SPC today. Pt also demonstrates 5 point improvement with Berg Balance score compared to her initial evaluation suggesting improved balance and decreased fall risk.        [facility] PT Assessment - 09/28/15 1401    Berg Balance Test   Sit to Stand Able to stand  independently using hands   Standing Unsupported Able to stand safely 2 minutes   Sitting with Back Unsupported but Feet Supported on Floor or Stool Able to sit safely and securely 2 minutes   Stand to Sit Controls descent by using hands   Transfers Able to transfer safely, definite need of hands   Standing Unsupported with Eyes  Closed Able to stand 10 seconds safely   Standing Ubsupported with Feet Together Able to place feet together independently and stand for 1 minute with supervision   From Standing, Reach Forward with Outstretched Arm Can reach forward >12 cm safely (5")   From Standing Position, Pick up Object from [place] to pick up shoe safely and easily   From Standing Position, Turn to Look Behind Over each Shoulder Looks behind from both sides and weight shifts well   Turn 360 Degrees Needs close supervision or verbal cueing   Standing Unsupported, Alternately Place Feet on Step/Stool Able to complete >2 steps/needs minimal assist   Standing Unsupported, One Foot in Front Able to plae foot ahead of the other independently and hold 30 seconds   Standing on One Leg Unable to try or needs assist to prevent fall   Total Score 40   Berg comment: increased fall risk; suggests need for use of assistive device; improved score by 5 points since initial evaluation                             PT Education - 09/28/15 1310    Education provided Yes   Education Details ther-ex   [employer]) Educated Patient   Methods Explanation;Demonstration;Tactile cues;Verbal cues   Comprehension Verbalized understanding;Returned demonstration;Verbal cues required             PT Long Term Goals - 09/02/15 2110    PT LONG TERM GOAL #1   Title Patient will be independent with her HEP to promote LE strength and balance.    Time 4   Period Weeks   Status New   PT LONG TERM GOAL #2   Title Patient will improve bilateral LE strength to promote ability to perform standing tasks and ambulate.    Time 4   Period Weeks   Status New   PT LONG TERM GOAL #3   Title Patient will improve BERG blance score to at least 46/56 as a demonstration of improved balance.                Plan - 09/28/15 1307    Clinical Impression Statement Pt only needed min verbal, cues for sequencing using SPC today. Pt  also demonstrates 5 point improvement with Berg Balance score compared to her initial evaluation suggesting improved balance and decreased fall risk.    Pt will benefit from skilled therapeutic intervention in order to improve on the following deficits Decreased strength;Decreased balance;Difficulty walking   Rehab Potential Good   Clinical Impairments Affecting Rehab Potential age, chronicity of condition   PT Frequency 2x / week   PT Duration 4 weeks   PT Treatment/Interventions Manual techniques;Therapeutic exercise;Therapeutic activities;Gait training;Neuromuscular re-education   PT Next  Visit Plan balance activities, LE strengthening   Consulted and Agree with Plan of Care Patient        Problem List There are no active problems to display for this patient.  Joneen Boers PT, DPT   09/28/2015, 2:08 PM  [facility] [address] [place], [address] Phone: [phone]   Fax:  [phone]

## 2015-09-30 ENCOUNTER — Ambulatory Visit: Payer: Commercial Managed Care - HMO

## 2015-09-30 DIAGNOSIS — R262 Difficulty in walking, not elsewhere classified: Secondary | ICD-10-CM

## 2015-09-30 DIAGNOSIS — Z9181 History of falling: Secondary | ICD-10-CM | POA: Diagnosis not present

## 2015-09-30 DIAGNOSIS — R531 Weakness: Secondary | ICD-10-CM

## 2015-09-30 NOTE — Therapy (Signed)
[facility] [address] [place], [address] Phone: [phone]   Fax:  [phone]  Physical Therapy Treatment And  Progress Report  Patient Details  Name: Victoria Esparza MRN: [id number] Date of Birth: [birth year] Referring Provider:  Emmaline Kluver.,*  Encounter Date: 09/30/2015       PT End of Session - 09/30/15 1304    Visit Number 8   Number of Visits 9   Date for PT Re-Evaluation 10/01/15   Authorization Type 8   Authorization Time Period of 10   PT Start Time 1304   PT Stop Time 1354   PT Time Calculation (min) 50 min   Equipment Utilized During Treatment Gait belt  SPC   Activity Tolerance Patient tolerated treatment well   Behavior During Therapy [facility] for tasks assessed/performed      Past Medical History  Diagnosis Date  . Hypertension   . Arthritis     No past surgical history on file.  There were no vitals filed for this visit.  Visit Diagnosis:  Risk for falls - Plan: PT plan of care cert/re-cert  Weakness - Plan: PT plan of care cert/re-cert  Difficulty walking - Plan: PT plan of care cert/re-cert      Subjective Assessment - 09/30/15 1305    Subjective Pt states only having a tiny bit of burning in her feet. Does not bother her anymore. Pt states feeling better walking without holding onto anyone. Feels like she can walk by herself.    Pertinent History Pt states having balance difficulties for about 5 years. Feels like both feet have numbness and burning (started after balance problems). Her MD stated that she had neuropathy. Has difficulty sleeping secondary to increased LE burning sensation plantar suface of her feet.  Pt states she feels that the issues of her feet may play a factor in her balance problems.  Pt state that she sometimes walks by herself short distances without AD but holding onto furniture. Does not use any AD. Does not leave home without assistance  from another person for safety.  Currently has difficulty with  walking. Has not had PT for her balance challenges yet.  Currently lives in a one story home without steps to enter.  Pt states that she also had pneumonia about 2 weeks ago. Currently being followe by a PA. Feels better but still has some problems breathing.    Patient Stated Goals Pt states wanting to be able to walk better.    Currently in Pain? No/denies   Multiple Pain Sites No     There-ex  Directed patient with seated R and L LE neural flossing 10x3 each LE,  Seated manually resisted knee flexion, knee extension, hip flexion, hip ER, seated clam shell (hips less than 90 degrees flexion), ankle DF, PF 1-2x each way for each LE.   Reviewed progress/current status with LE strength with pt.   Directed patient with standing up from a chair, walking 20 ft with a turn after 10 ft, then sitting back down onto her chair 4x,  Gait using SPC on L 100 ft and 30 ft with CGA,  Standing heel/toe raises 10x3 each way  T-band side step resisting yellow band 4 ft x 5 each direction with bilateral UE assist at the treatmill bar.   Improved exercise technique, movement at target joints, use of target muscles after mod verbal, visual, tactile cues.   TUG: 19 seconds, 19 seconds (  pt tripped on her R foot but was able to recover independently), 21 seconds. Time greater than 12 seconds suggests increased fall risk.     Pt demonstrates very good carry over with proper sequencing with gait with SPC as well as being more sturdy using the AD compared to previous sessions. She has also demonstrates improved R hip flexion, knee flexion, knee extension, bilateral glute med, and L knee flexion strength as well as improved Berg Balance score by 5 points since her initial evaluation suggesting improved balance. Pt still has LE weakness and demonstrates fall risk based on her Berg Balance score and TUG times and would benefit from continued skilled  physical therapy services to address the aforementioned deficits and to improve her ability to ambulate.                  [facility] PT Assessment - 09/30/15 1310    Observation/Other Assessments   Observations TUG: 19 seconds; 19 seconds; 21 seconds   Strength   Right Hip Flexion 4/5   Right Hip External Rotation  4/5   Right Hip ABduction 4+/5  seated clamshell (hips < 90 degrees flexion)    Left Hip Flexion 4-/5   Left Hip External Rotation 4/5   Left Hip ABduction 4+/5  seated clamshell (hips < 90 degrees flexion)    Right Knee Flexion 4+/5   Right Knee Extension 5/5   Left Knee Flexion 4+/5   Left Knee Extension 4+/5   Right Ankle Dorsiflexion 4+/5   Right Ankle Plantar Flexion 4+/5   Left Ankle Dorsiflexion 4+/5   Left Ankle Plantar Flexion 4+/5   Berg Balance Test   Sit to Stand Able to stand  independently using hands   Standing Unsupported Able to stand safely 2 minutes   Sitting with Back Unsupported but Feet Supported on Floor or Stool Able to sit safely and securely 2 minutes   Stand to Sit Controls descent by using hands   Transfers Able to transfer safely, definite need of hands   Standing Unsupported with Eyes Closed Able to stand 10 seconds safely   Standing Ubsupported with Feet Together Able to place feet together independently and stand for 1 minute with supervision   From Standing, Reach Forward with Outstretched Arm Can reach forward >12 cm safely (5")   From Standing Position, Pick up Object from Thomas to pick up shoe safely and easily   From Standing Position, Turn to Look Behind Over each Shoulder Looks behind from both sides and weight shifts well   Turn 360 Degrees Needs close supervision or verbal cueing   Standing Unsupported, Alternately Place Feet on Step/Stool Able to complete >2 steps/needs minimal assist   Standing Unsupported, One Foot in Front Able to plae foot ahead of the other independently and hold 30 seconds   Standing on One Leg  Unable to try or needs assist to prevent fall   Total Score 40   Berg comment: increased fall risk; suggests need for use of assistive device; improved score by 5 points since initial evaluation  Measurement performed on 09/28/15                             PT Education - 09/30/15 1309    Education provided Yes   Education Details ther-ex; progress/current status with LE strength and balance   Person(s) Educated Patient   Methods Explanation;Demonstration;Tactile cues;Verbal cues   Comprehension Verbalized understanding;Returned demonstration  PT Long Term Goals - 09/30/15 1319    PT LONG TERM GOAL #1   Title Patient will be independent with her HEP to promote LE strength and balance.    Time 4   Period Weeks   Status On-going   PT LONG TERM GOAL #2   Title Patient will improve bilateral LE strength to promote ability to perform standing tasks and ambulate.    Time 4   Period Weeks   Status Partially Met   PT LONG TERM GOAL #3   Title Patient will improve BERG blance score to at least 46/56 as a demonstration of improved balance.    Time 4   Period Weeks   Status On-going               Plan - 09/30/15 1309    Clinical Impression Statement Pt demonstrates very good carry over with proper sequencing with gait with SPC as well as being more sturdy using the AD compared to previous sessions. She also demonstrates improved R hip flexion, knee flexion, knee extension, bilateral glute med, and L knee flexion strength as well as improved Berg Balance score by 5 points since her initial evaluation suggesting improved balance. Pt still has LE weakness and demonstrates fall risk based on her Berg Balance score and TUG times and would benefit from continued skilled physical therapy services to address the aforementioned deficits and to improve her ability to ambulate.    Pt will benefit from skilled therapeutic intervention in order to improve on  the following deficits Decreased strength;Decreased balance;Difficulty walking   Rehab Potential Good   Clinical Impairments Affecting Rehab Potential age, chronicity of condition   PT Frequency 2x / week   PT Duration 4 weeks   PT Treatment/Interventions Manual techniques;Therapeutic exercise;Therapeutic activities;Gait training;Neuromuscular re-education   PT Next Visit Plan balance activities, LE strengthening   Consulted and Agree with Plan of Care Patient        Problem List There are no active problems to display for this patient.   Joneen Boers PT, DPT   09/30/2015, 7:27 PM  [facility] [address], [place], [address] Phone: [phone]   Fax:  [phone]

## 2015-09-30 NOTE — Patient Instructions (Signed)
Pt was recommended to continue performing her seated knee extensions with ankle DF to help floss her nerves to decrease the burning sensation in her feet,  as well as to perform sit <> stand exercises from her chair at home to help with her LE strength.   Pt was also recommended to practice walking with a SPC only if her daughter is present (cane on L side). Daughter was educated on gait pattern with SPC and to use a gait belt or pant belt on pt with her hand on it for safety. Pt and daughter verbalized understanding.

## 2015-10-05 ENCOUNTER — Ambulatory Visit: Payer: Commercial Managed Care - HMO

## 2015-10-05 DIAGNOSIS — R262 Difficulty in walking, not elsewhere classified: Secondary | ICD-10-CM | POA: Diagnosis not present

## 2015-10-05 DIAGNOSIS — R531 Weakness: Secondary | ICD-10-CM

## 2015-10-05 DIAGNOSIS — Z9181 History of falling: Secondary | ICD-10-CM

## 2015-10-05 NOTE — Therapy (Signed)
[facility] [address] [place], [address] Phone: [phone]   Fax:  [phone]  Physical Therapy Treatment  Patient Details  Name: Victoria Esparza MRN: [id number] Date of Birth: [birth year] No Data Recorded  Encounter Date: 10/05/2015      PT End of Session - 10/05/15 1302    Visit Number 9   Number of Visits 17   Date for PT Re-Evaluation 10/01/15   Authorization Type 9   Authorization Time Period of 10   PT Start Time 1303   PT Stop Time 1346   PT Time Calculation (min) 43 min   Equipment Utilized During Treatment Gait belt  SPC   Activity Tolerance Patient tolerated treatment well   Behavior During Therapy [facility] for tasks assessed/performed      Past Medical History  Diagnosis Date  . Hypertension   . Arthritis     No past surgical history on file.  There were no vitals filed for this visit.  Visit Diagnosis:  Risk for falls  Weakness  Difficulty walking      Subjective Assessment - 10/05/15 1303    Subjective Pt states just a tiny bit of burning in her feet. Not really noticable. Legs do not feel as steady today.    Pertinent History Pt states having balance difficulties for about 5 years. Feels like both feet have numbness and burning (started after balance problems). Her MD stated that she had neuropathy. Has difficulty sleeping secondary to increased LE burning sensation plantar suface of her feet.  Pt states she feels that the issues of her feet may play a factor in her balance problems.  Pt state that she sometimes walks by herself short distances without AD but holding onto furniture. Does not use any AD. Does not leave home without assistance from another person for safety.  Currently has difficulty with  walking. Has not had PT for her balance challenges yet.  Currently lives in a one story home without steps to enter.  Pt states that she also had pneumonia about 2 weeks ago.  Currently being followe by a PA. Feels better but still has some problems breathing.    Patient Stated Goals Pt states wanting to be able to walk better.    Currently in Pain? No/denies   Multiple Pain Sites No      There-ex  Directed patient with supine clam shells resisting red band 10x3,  Supine bridge with red band resisting hip abduction/ER 10x3 Sit <> stand resisting red band hip abduction/ER 10x2 Gait 128 ft x2 with SPC on L CGA. Then 32 ft with SPC with  SBA.  Cues for maintaining feet at shoulder width apart and when pt has a difficult time continuing the sequence with the [facility], to pause and regroup and to start back up again.  Pt observed to try to walk towards her car without another person with her and without AD. Accompanied patient. Gait to her car 45 ft without AD but with SBA to CGA. LOB when trying to step off a curb, PT mod assist to recover. Pt was recommended to have someone with her when she walks for safety. Pt verbalized understanding.   Improved exercise technique, movement at target joints, use of target muscles after mod verbal, visual, tactile cues.     Improve gait distance when walking with SPC compared to previous visits. LOB x 1 each 128 ft of gait but pt able to recover herself.  LOB x 1 when trying to step off a curb to get to her vehicle.         [facility] PT Assessment - 10/05/15 1320    Observation/Other Assessments   Observations (-) VBI, (-) VOR, (-) Smooth pursuit                             PT Education - 10/05/15 1309    Education provided Yes   Education Details ther-ex   [employer]) Educated Patient   Methods Explanation;Demonstration;Tactile cues;Verbal cues   Comprehension Verbalized understanding;Returned demonstration             PT Long Term Goals - 09/30/15 1319    PT LONG TERM GOAL #1   Title Patient will be independent with her HEP to promote LE strength and balance.    Time 4   Period Weeks   Status  On-going   PT LONG TERM GOAL #2   Title Patient will improve bilateral LE strength to promote ability to perform standing tasks and ambulate.    Time 4   Period Weeks   Status Partially Met   PT LONG TERM GOAL #3   Title Patient will improve BERG blance score to at least 46/56 as a demonstration of improved balance.    Time 4   Period Weeks   Status On-going               Plan - 10/05/15 1302    Clinical Impression Statement Improve gait distance when walking with SPC compared to previous visits. LOB x 1 each 128 ft of gait but pt able to recover herself. LOB x 1 when trying to step off a curb to get to her vehicle when trying to walk without assistance. PT assist needed to recover. Pt was recommended to have someone with her when she walks for safety. Pt verbalized understanding.     Pt will benefit from skilled therapeutic intervention in order to improve on the following deficits Decreased strength;Decreased balance;Difficulty walking   Rehab Potential Good   Clinical Impairments Affecting Rehab Potential age, chronicity of condition   PT Frequency 2x / week   PT Duration 4 weeks   PT Treatment/Interventions Manual techniques;Therapeutic exercise;Therapeutic activities;Gait training;Neuromuscular re-education   PT Next Visit Plan balance activities, LE strengthening   Consulted and Agree with Plan of Care Patient        Problem List There are no active problems to display for this patient.   Joneen Boers PT, DPT   10/05/2015, 1:54 PM  [facility] [address], [place], [address] Phone: [phone]   Fax:  [phone]  Name: Victoria Esparza MRN: [id number] Date of Birth: 1928-09-11

## 2015-10-07 ENCOUNTER — Ambulatory Visit: Payer: Commercial Managed Care - HMO

## 2015-10-07 DIAGNOSIS — Z9181 History of falling: Secondary | ICD-10-CM

## 2015-10-07 DIAGNOSIS — R262 Difficulty in walking, not elsewhere classified: Secondary | ICD-10-CM | POA: Diagnosis not present

## 2015-10-07 DIAGNOSIS — R531 Weakness: Secondary | ICD-10-CM | POA: Diagnosis not present

## 2015-10-07 NOTE — Therapy (Signed)
[facility] [address] , [place], [address] Phone: [phone]   Fax:  [phone]  Physical Therapy Treatment And  Progress Report 09/02/15- 10/07/15  Patient Details  Name: Victoria Esparza MRN: [id number] Date of Birth: 10/04/1928 Referring Provider: George W Kernodle, MD  Encounter Date: 10/07/2015      PT End of Session - 10/07/15 1301    Visit Number 10   Number of Visits 17   Date for PT Re-Evaluation 10/29/15  per recert on 09/30/15   Authorization Type 1   Authorization Time Period of 10   PT Start Time 1300   PT Stop Time 1350   PT Time Calculation (min) 50 min   Equipment Utilized During Treatment Gait belt  SPC   Activity Tolerance Patient tolerated treatment well   Behavior During Therapy [facility] for tasks assessed/performed      Past Medical History  Diagnosis Date  . Hypertension   . Arthritis     No past surgical history on file.  There were no vitals filed for this visit.  Visit Diagnosis:  Risk for falls  Weakness  Difficulty walking      Subjective Assessment - 10/07/15 1300    Subjective Pt states that she got a good workout Monday. Was sore afterwards but better today. No burning sensation in her feet currently. Pt also feels like she is walking better. Sometimes she stumbles. She's not perfect yet.    Pertinent History Pt states having balance difficulties for about 5 years. Feels like both feet have numbness and burning (started after balance problems). Her MD stated that she had neuropathy. Has difficulty sleeping secondary to increased LE burning sensation plantar suface of her feet.  Pt states she feels that the issues of her feet may play a factor in her balance problems.  Pt state that she sometimes walks by herself short distances without AD but holding onto furniture. Does not use any AD. Does not leave home without assistance from another person for safety.   Currently has difficulty with  walking. Has not had PT for her balance challenges yet.  Currently lives in a one story home without steps to enter.  Pt states that she also had pneumonia about 2 weeks ago. Currently being followe by a PA. Feels better but still has some problems breathing.    Patient Stated Goals Pt states wanting to be able to walk better.    Currently in Pain? No/denies      Objectives: There-ex  Directed patient with sit <> stand with red band resisting hip abduction/ER 10x3 at low mat table  (chair height), Forward step onto and over Air Ex pad with one UE assist 10x2 each LE (cues for bringing center of gravity over foot, and to keep her feet apart when stepping off pad to not trip on her foot) Forward step down from Air Ex pad with one UE assist 10x2 each LE with cues to place center of gravity onto foot that steps off, mod assist Gait with SPC on L 53 ft, 34 ft, 25 ft CGA Forward step down from curb with SPC 3x, CGA Forward step up from curb 3x with SPC CGA Gait 30 ft without AD, CGA    Improved exercise technique, movement at target joints, use of target muscles after mod verbal, visual, tactile cues. Also, mod verbal cues to pause and regroup when tired when she feels like her proper gait sequence is worsening   to be able to walk properly again.   Pt demonstrates loss of balance x1 when stepping off a curb 2 days ago and needed PT assist to recover. Today, pt improved ability to transfer weight onto LE that steps off the curb or Air Ex pad with mod verbal, and tactile cues and with SPC. Overall, pt improving ability to ambulate with proper sequencing using SPC for safety since initial evaluation.           [facility] PT Assessment - 10/07/15 1405    Assessment   Referring Provider George W Kernodle, MD                            PT Education - 10/07/15 1306    Education provided Yes   Education Details ther-ex   Person(s) Educated Patient    Methods Explanation;Demonstration;Tactile cues;Verbal cues   Comprehension Verbalized understanding;Returned demonstration             PT Long Term Goals - 09/30/15 1319    PT LONG TERM GOAL #1   Title Patient will be independent with her HEP to promote LE strength and balance.    Time 4   Period Weeks   Status On-going   PT LONG TERM GOAL #2   Title Patient will improve bilateral LE strength to promote ability to perform standing tasks and ambulate.    Time 4   Period Weeks   Status Partially Met   PT LONG TERM GOAL #3   Title Patient will improve BERG blance score to at least 46/56 as a demonstration of improved balance.    Time 4   Period Weeks   Status On-going               Plan - 10/07/15 1301    Clinical Impression Statement Pt demonstrates loss of balance x1 when stepping off a curb 2 days ago and needed PT assist to recover. Today, pt improved ability to transfer weight onto LE that steps off the curb or Air Ex pad with mod verbal, and tactile cues and with SPC. Overall, pt improving ability to ambulate with proper sequencing using SPC for safety since initial evaluation.    Pt will benefit from skilled therapeutic intervention in order to improve on the following deficits Decreased strength;Decreased balance;Difficulty walking   Rehab Potential Good   Clinical Impairments Affecting Rehab Potential age, chronicity of condition   PT Frequency 2x / week   PT Duration 4 weeks   PT Treatment/Interventions Manual techniques;Therapeutic exercise;Therapeutic activities;Gait training;Neuromuscular re-education   PT Next Visit Plan balance activities, LE strengthening   Consulted and Agree with Plan of Care Patient          G-Codes - 10/07/15 1318    Functional Assessment Tool Used clinical presentation, patient interview   Functional Limitation Mobility: Walking and moving around   Mobility: Walking and Moving Around Current Status (G8978) At least 40 percent  but less than 60 percent impaired, limited or restricted   Mobility: Walking and Moving Around Goal Status (G8979) At least 20 percent but less than 40 percent impaired, limited or restricted      Problem List There are no active problems to display for this patient.  Thank you for your referral.   Miguel Laygo PT, DPT   10/07/2015, 2:09 PM  [facility] [address] [place], [place], [address] Phone: [phone]   Fax:    [phone]  Name: Shakiya J Soloway MRN: [id number] Date of Birth: 07/10/1928     

## 2015-10-12 ENCOUNTER — Ambulatory Visit: Payer: Commercial Managed Care - HMO

## 2015-10-12 DIAGNOSIS — R531 Weakness: Secondary | ICD-10-CM

## 2015-10-12 DIAGNOSIS — R262 Difficulty in walking, not elsewhere classified: Secondary | ICD-10-CM | POA: Diagnosis not present

## 2015-10-12 DIAGNOSIS — Z9181 History of falling: Secondary | ICD-10-CM | POA: Diagnosis not present

## 2015-10-12 NOTE — Therapy (Signed)
[place] [address], [place], [address] Phone: [phone]   Fax:  [phone]  Physical Therapy Treatment  Patient Details  Name: Victoria Esparza MRN: [id number] Date of Birth: Jun 03, 1928 Referring Provider: Cynda Familia, MD  Encounter Date: 10/12/2015      PT End of Session - 10/12/15 1313    Visit Number 11   Number of Visits 17   Date for PT Re-Evaluation 82/42/35  per recert on 36/14/43   Authorization Type 2   Authorization Time Period of 10   PT Start Time 1300   PT Stop Time 1342   PT Time Calculation (min) 42 min   Equipment Utilized During Treatment Gait belt  SPC   Activity Tolerance Patient tolerated treatment well   Behavior During Therapy [facility] for tasks assessed/performed      Past Medical History  Diagnosis Date  . Hypertension   . Arthritis     No past surgical history on file.  There were no vitals filed for this visit.  Visit Diagnosis:  Risk for falls  Weakness  Difficulty walking      Subjective Assessment - 10/12/15 1306    Subjective Pt states that she has been doing a lot of walking at home   Pertinent History Pt states having balance difficulties for about 5 years. Feels like both feet have numbness and burning (started after balance problems). Her MD stated that she had neuropathy. Has difficulty sleeping secondary to increased LE burning sensation plantar suface of her feet.  Pt states she feels that the issues of her feet may play a factor in her balance problems.  Pt state that she sometimes walks by herself short distances without AD but holding onto furniture. Does not use any AD. Does not leave home without assistance from another person for safety.  Currently has difficulty with  walking. Has not had PT for her balance challenges yet.  Currently lives in a one story home without steps to enter.  Pt states that she also had pneumonia about 2 weeks ago.  Currently being followe by a PA. Feels better but still has some problems breathing.    Patient Stated Goals Pt states wanting to be able to walk better.    Currently in Pain? No/denies   Multiple Pain Sites No     Objectives:  There-ex   Directed patient with gait without AD 50 ft (LOB x 1, min A from PT to recover; pt crossed her legs and tripped),  Gait with SPC 100 ft, 32 ft, and throughout session CGA,  Side stepping 32 ft x 2 each direction wit SPC for glute med strengthening Standing hip abduction with bilateral UE assist from treadmill bars 10x2 each LE Sit <> stand from regular chair with red band resisting hip abduction/ER 10x3,  Forward step onto and off Air Ex Pad 10x2 each LE with one UE assist, CGA from PT    Improved exercise technique, increase base of support, movement at target joints, use of target muscles after mod verbal, visual, tactile cues.    LOB x 1 with gait without SPC secondary to pt tripping on her leg. Tendency to cross L LE in front of R LE during R LE stance phase of gait (possibly due to R glute med weakness and L pelvic drop) without SPC. Pt also needed cues to not trip on her toes when walking with and without AD. Improved ability to transfer body  weight onto the LE which she steps onto when stepping off the Air Ex pad (for safety when stepping off of a curb) after mod verbal, visual, tactile cues.             PT Education - 10/12/15 1313    Education provided Yes   Education Details ther-ex   [employer]) Educated Patient   Methods Explanation;Demonstration;Tactile cues;Verbal cues   Comprehension Verbalized understanding;Returned demonstration             PT Long Term Goals - 09/30/15 1319    PT LONG TERM GOAL #1   Title Patient will be independent with her HEP to promote LE strength and balance.    Time 4   Period Weeks   Status On-going   PT LONG TERM GOAL #2   Title Patient will improve bilateral LE strength to promote ability  to perform standing tasks and ambulate.    Time 4   Period Weeks   Status Partially Met   PT LONG TERM GOAL #3   Title Patient will improve BERG blance score to at least 46/56 as a demonstration of improved balance.    Time 4   Period Weeks   Status On-going               Plan - 10/12/15 1314    Clinical Impression Statement LOB x 1 with gait without SPC secondary to pt tripping on her leg. Tendency to cross L LE in front of R LE during R LE stance phase of gait (possibly due to R glute med weakness and L pelvic drop) without SPC. Pt also needed cues to increase hip flexion to not trip on her toes when walking with and without AD. Improved ability to transfer body weight onto the LE which she steps onto when stepping off the Air Ex pad (for safety when stepping off of a curb) after mod verbal, visual, tactile cues.    Pt will benefit from skilled therapeutic intervention in order to improve on the following deficits Decreased strength;Decreased balance;Difficulty walking   Rehab Potential Good   Clinical Impairments Affecting Rehab Potential age, chronicity of condition   PT Frequency 2x / week   PT Duration 4 weeks   PT Treatment/Interventions Manual techniques;Therapeutic exercise;Therapeutic activities;Gait training;Neuromuscular re-education   PT Next Visit Plan balance activities, LE strengthening   Consulted and Agree with Plan of Care Patient    f    Problem List There are no active problems to display for this patient.   Joneen Boers PT, DPT   10/12/2015, 1:53 PM  [facility] [address], [place], [address] Phone: [phone]   Fax:  [phone]  Name: Victoria Esparza MRN: [id number] Date of Birth: 1928/07/18

## 2015-10-14 ENCOUNTER — Ambulatory Visit: Payer: Commercial Managed Care - HMO

## 2015-10-14 DIAGNOSIS — Z9181 History of falling: Secondary | ICD-10-CM | POA: Diagnosis not present

## 2015-10-14 DIAGNOSIS — R531 Weakness: Secondary | ICD-10-CM | POA: Diagnosis not present

## 2015-10-14 DIAGNOSIS — R262 Difficulty in walking, not elsewhere classified: Secondary | ICD-10-CM | POA: Diagnosis not present

## 2015-10-14 NOTE — Therapy (Signed)
[place] [facility] [address] [place], [address] Phone: [phone]   Fax:  [phone]  Physical Therapy Treatment  Patient Details  Name: Victoria Esparza MRN: [id number] Date of Birth: Sep 12, 1928 Referring Provider: Cynda Familia, MD  Encounter Date: 10/14/2015      PT End of Session - 10/14/15 1303    Visit Number 12   Number of Visits 17   Date for PT Re-Evaluation 69/67/89  per recert on 38/10/17   Authorization Type 3   Authorization Time Period of 10   PT Start Time 1304   PT Stop Time 1350   PT Time Calculation (min) 46 min   Equipment Utilized During Treatment Gait belt  SPC   Activity Tolerance Patient tolerated treatment well   Behavior During Therapy [facility] for tasks assessed/performed      Past Medical History  Diagnosis Date  . Hypertension   . Arthritis     No past surgical history on file.  There were no vitals filed for this visit.  Visit Diagnosis:  Risk for falls  Weakness  Difficulty walking      Subjective Assessment - 10/14/15 1310    Subjective Patient states that the burning sensation in her feet went away.    Pertinent History Pt states having balance difficulties for about 5 years. Feels like both feet have numbness and burning (started after balance problems). Her MD stated that she had neuropathy. Has difficulty sleeping secondary to increased LE burning sensation plantar suface of her feet.  Pt states she feels that the issues of her feet may play a factor in her balance problems.  Pt state that she sometimes walks by herself short distances without AD but holding onto furniture. Does not use any AD. Does not leave home without assistance from another person for safety.  Currently has difficulty with  walking. Has not had PT for her balance challenges yet.  Currently lives in a one story home without steps to enter.  Pt states that she also had pneumonia about 2  weeks ago. Currently being followe by a PA. Feels better but still has some problems breathing.    Patient Stated Goals Pt states wanting to be able to walk better.    Currently in Pain? No/denies   Multiple Pain Sites No     Objectives:   There-ex  Directed patient with SLS on R LE with emphasis on R glute med use 10x, then 10x5 seconds for 2 sets, and 7x 5 seconds Gait with SPC on L side 43 ft x 2, and 33 ft x 4 with CGA from PT, Stepping onto and off a curb 4x, then 2x with cues for up with the good (L LE) and down with the bad/weak (R LE) technique, using SPC Side stepping using SPC 32 ft each way Sit <> stand from regular chair with red band resisting hip abduction/ER 10x2 (reviewed and given as part of her HEP, to be performed with back of chair against the wall so the chair does not slide back for safety; pt demonstrated and verbalized understanding) Gait with out AD 32 ft x 2 with CGA and cues for increasing base of support and increasing hip and knee flexion to promote foot clearance.   Improved exercise technique, movement at target joints, use of target muscles after mod verbal, visual, tactile cues.    R lateral lean during R LE stance phase of gait. Improved stability with walking  without AD when pt kept her feet further apart to increase base of support as well as with cues for increasing hip and knee flexion to promote foot clearance. Increased R lateral lean and decreased ability to support herself during R LE stance phase of gait when walking longer distances without AD due to fatigue.                             PT Education - 10/14/15 1311    Education provided Yes   Education Details ther-ex, HEP   Person(s) Educated Patient   Methods Explanation;Demonstration;Tactile cues;Verbal cues;Handout   Comprehension Verbalized understanding;Returned demonstration             PT Long Term Goals - 09/30/15 1319    PT LONG TERM GOAL #1   Title  Patient will be independent with her HEP to promote LE strength and balance.    Time 4   Period Weeks   Status On-going   PT LONG TERM GOAL #2   Title Patient will improve bilateral LE strength to promote ability to perform standing tasks and ambulate.    Time 4   Period Weeks   Status Partially Met   PT LONG TERM GOAL #3   Title Patient will improve BERG blance score to at least 46/56 as a demonstration of improved balance.    Time 4   Period Weeks   Status On-going               Plan - 10/14/15 1311    Clinical Impression Statement R lateral lean during R LE stance phase of gait. Improved stability with walking without AD when pt kept her feet further apart to increase base of support as well as with cues for increasing hip and knee flexion to promote foot clearance. Increased R lateral lean and decreased ability to support herself during R LE stance phase of gait when walking longer distances without AD due to fatigue.    Pt will benefit from skilled therapeutic intervention in order to improve on the following deficits Decreased strength;Decreased balance;Difficulty walking   Rehab Potential Good   Clinical Impairments Affecting Rehab Potential age, chronicity of condition   PT Frequency 2x / week   PT Duration 4 weeks   PT Treatment/Interventions Manual techniques;Therapeutic exercise;Therapeutic activities;Gait training;Neuromuscular re-education   PT Next Visit Plan balance activities, LE strengthening   Consulted and Agree with Plan of Care Patient        Problem List There are no active problems to display for this patient.   Joneen Boers PT, DPT    10/14/2015, 2:06 PM  [place] [facility] [address], [place], [address] Phone: [phone]   Fax:  [phone]  Name: Victoria Esparza MRN: [id number] Date of Birth: 1928/02/15

## 2015-10-14 NOTE — Patient Instructions (Signed)
   Sitting on a chair against the wall with red band around your knees, move your knees and feet shoulder width apart. Stand up and sit down, trying not to let your knees turn in.  Perform 10 times. Do 2-3 sets daily.

## 2015-10-19 ENCOUNTER — Ambulatory Visit: Payer: Commercial Managed Care - HMO

## 2015-10-21 DIAGNOSIS — N390 Urinary tract infection, site not specified: Secondary | ICD-10-CM | POA: Diagnosis not present

## 2015-10-21 DIAGNOSIS — R399 Unspecified symptoms and signs involving the genitourinary system: Secondary | ICD-10-CM | POA: Diagnosis not present

## 2015-10-28 ENCOUNTER — Ambulatory Visit: Payer: Commercial Managed Care - HMO | Attending: Rheumatology

## 2015-10-28 DIAGNOSIS — Z9181 History of falling: Secondary | ICD-10-CM | POA: Diagnosis not present

## 2015-10-28 DIAGNOSIS — R262 Difficulty in walking, not elsewhere classified: Secondary | ICD-10-CM | POA: Diagnosis not present

## 2015-10-28 DIAGNOSIS — R531 Weakness: Secondary | ICD-10-CM

## 2015-10-28 NOTE — Therapy (Signed)
[facility] [place] [facility] [address], [place], [address] Phone: [phone]   Fax:  [phone]  Physical Therapy Treatment  Patient Details  Name: Victoria Esparza MRN: [id number] Date of Birth: 09-13-1928 Referring Provider: Cynda Familia, MD  Encounter Date: 10/28/2015      PT End of Session - 10/28/15 1349    Visit Number 13   Number of Visits 25   Date for PT Re-Evaluation 51/88/41  per recert on 66/06/30   Authorization Type 4   Authorization Time Period of 10   PT Start Time 1349   PT Stop Time 1434   PT Time Calculation (min) 45 min   Equipment Utilized During Treatment Gait belt  SPC   Activity Tolerance Patient tolerated treatment well   Behavior During Therapy [facility] for tasks assessed/performed      Past Medical History  Diagnosis Date  . Hypertension   . Arthritis     No past surgical history on file.  There were no vitals filed for this visit.  Visit Diagnosis:  Risk for falls - Plan: PT plan of care cert/re-cert  Weakness - Plan: PT plan of care cert/re-cert  Difficulty walking - Plan: PT plan of care cert/re-cert      Subjective Assessment - 10/28/15 1412    Subjective Pt states starting an antibiotic for 5 days since Monday for her current UTI. Feels like the UTI is better. No burning in her feet. Also feels like she is walking better.    Pertinent History Pt states having balance difficulties for about 5 years. Feels like both feet have numbness and burning (started after balance problems). Her MD stated that she had neuropathy. Has difficulty sleeping secondary to increased LE burning sensation plantar suface of her feet.  Pt states she feels that the issues of her feet may play a factor in her balance problems.  Pt state that she sometimes walks by herself short distances without AD but holding onto furniture. Does not use any AD. Does not leave home without assistance from another  person for safety.  Currently has difficulty with  walking. Has not had PT for her balance challenges yet.  Currently lives in a one story home without steps to enter.  Pt states that she also had pneumonia about 2 weeks ago. Currently being followe by a PA. Feels better but still has some problems breathing.    Patient Stated Goals Pt states wanting to be able to walk better.    Currently in Pain? No/denies   Multiple Pain Sites No            [facility] PT Assessment - 10/28/15 1416    Strength   Right Hip Flexion 4/5   Right Hip External Rotation  4/5   Right Hip ABduction 4+/5  seated clamshell position (hips < 90 degrees flexion)    Left Hip Flexion 4-/5   Left Hip External Rotation 4/5   Left Hip ABduction 4+/5  seated clamshell position (hips < 90 degrees flexion)    Right Knee Flexion 4+/5   Right Knee Extension 5/5   Left Knee Flexion 4+/5   Left Knee Extension 5/5   Right Ankle Dorsiflexion 4+/5   Right Ankle Plantar Flexion 5/5   Left Ankle Dorsiflexion 4+/5   Left Ankle Plantar Flexion 5/5   Berg Balance Test   Sit to Stand Able to stand without using hands and stabilize independently   Standing Unsupported Able to  stand safely 2 minutes   Sitting with Back Unsupported but Feet Supported on Floor or Stool Able to sit safely and securely 2 minutes   Stand to Sit Sits safely with minimal use of hands   Transfers Able to transfer safely, minor use of hands   Standing Unsupported with Eyes Closed Able to stand 10 seconds safely   Standing Ubsupported with Feet Together Able to place feet together independently and stand 1 minute safely   From Standing, Reach Forward with Outstretched Arm Can reach forward >5 cm safely (2")   From Standing Position, Pick up Object from Floor Able to pick up shoe safely and easily   From Standing Position, Turn to Look Behind Over each Shoulder Looks behind from both sides and weight shifts well   Turn 360 Degrees Able to turn 360 degrees  safely but slowly   Standing Unsupported, Alternately Place Feet on Step/Stool Able to complete 4 steps without aid or supervision   Standing Unsupported, One Foot in Front Able to plae foot ahead of the other independently and hold 30 seconds   Standing on One Leg Unable to try or needs assist to prevent fall   Total Score 45   Berg comment: Improved balance, decreased fall risk, still needs AD     Objectives:  There-ex  Directed patient with gait using SPC on L 75 ft with CGA to SBA  standing alternated toe taps onto first regular step 4x each LE, then 10x2 each LE  Sit <> stand multiple times throughout session with and without UE assist,  Static standing balance: feet shoulder width apart eyes open, eyes closed, feet together, tandem stance (not heel toe),  Picking up an object (computer mouse) from the floor,  Turning around 360 degrees to the R and to the L 1x each way,  Looking behind her to the R and to the L,  Standing forward reach as far as she can, Seated manually resisted hip abduction/ER, hip flexion, hip ER,  knee flexion, knee extension, ankle DF/PF 1-2x each way for each LE Reviewed progress/current status with LE strength with pt Reviewed progress/current status with balance with pt.   Improved exercise technique, movement at target joints, use of target muscles after mod verbal, visual, tactile cues.     Patient has demonstrated improved Berg Balance score to 45/56 (5 point improvement since last progress report) suggesting improved balance and decreased fall risk since initial evaluation. Pt also better able to ambulate with SPC with improved gait tolerance and distance overall since initial evaluation. Still demonstrates some unsteadiness with gait using the AD when fatigued but overall improved. Patient will benefit from continued skilled physical therapy services to promote LE strength, balance, and improve ability to ambulate.          PT Education - 10/28/15  1414    Education provided Yes   Education Details ther-ex   [employer]) Educated Patient   Methods Explanation;Demonstration;Tactile cues;Verbal cues   Comprehension Verbalized understanding;Returned demonstration             PT Long Term Goals - 10/28/15 1434    PT LONG TERM GOAL #1   Title Patient will be independent with her HEP to promote LE strength and balance.    Time 4   Period Weeks   Status On-going   PT LONG TERM GOAL #2   Title Patient will improve bilateral LE strength to promote ability to perform standing tasks and ambulate.    Time 4  Period Weeks   Status Partially Met   PT LONG TERM GOAL #3   Title Patient will improve BERG blance score to at least 46/56 as a demonstration of improved balance.    Time 4   Period Weeks   Status On-going               Plan - 10/28/15 1415    Clinical Impression Statement Patient has demonstrated improved Berg Balance score to 45/56 (5 point improvement since last progress report) suggesting improved balance and decreased fall risk since initial evaluation. Pt also better able to ambulate with SPC with improved gait tolerance and distance overall since initial evaluation. Still demonstrates some unsteadiness with gait using the AD when fatigued but overall improved. Patient will benefit from continued skilled physical therapy services to promote LE strength, balance, and improve ability to ambulate.    Pt will benefit from skilled therapeutic intervention in order to improve on the following deficits Decreased strength;Decreased balance;Difficulty walking   Rehab Potential Good   Clinical Impairments Affecting Rehab Potential age, chronicity of condition   PT Frequency 2x / week   PT Duration 4 weeks   PT Treatment/Interventions Manual techniques;Therapeutic exercise;Therapeutic activities;Gait training;Neuromuscular re-education   PT Next Visit Plan balance activities, LE strengthening   Consulted and Agree with Plan  of Care Patient        Problem List There are no active problems to display for this patient.  Thank you for your referral.   Joneen Boers PT, DPT   10/28/2015, 3:00 PM  [place] [facility] [address], [place], [address] Phone: [phone]   Fax:  [phone]  Name: Victoria Esparza MRN: [id number] Date of Birth: [birth year]

## 2015-11-04 ENCOUNTER — Ambulatory Visit: Payer: Commercial Managed Care - HMO

## 2015-11-04 DIAGNOSIS — R531 Weakness: Secondary | ICD-10-CM | POA: Diagnosis not present

## 2015-11-04 DIAGNOSIS — R262 Difficulty in walking, not elsewhere classified: Secondary | ICD-10-CM

## 2015-11-04 DIAGNOSIS — Z9181 History of falling: Secondary | ICD-10-CM

## 2015-11-04 NOTE — Therapy (Signed)
[facility] [address], [place], [address] Phone: [phone]   Fax:  [phone]  Physical Therapy Treatment  Patient Details  Name: Victoria Esparza MRN: [id number] Date of Birth: 1928/12/09 Referring Provider: Cynda Familia, MD  Encounter Date: 11/04/2015      PT End of Session - 11/04/15 1355    Visit Number 14   Number of Visits 25   Date for PT Re-Evaluation 11/26/15   Authorization Type 5   Authorization Time Period of 10   PT Start Time 1354  Patient started late secondary to needing to use the bathroom    PT Stop Time 1437   PT Time Calculation (min) 43 min   Equipment Utilized During Treatment Gait belt  SPC   Activity Tolerance Patient tolerated treatment well   Behavior During Therapy [facility] for tasks assessed/performed      Past Medical History  Diagnosis Date  . Hypertension   . Arthritis     No past surgical history on file.  There were no vitals filed for this visit.  Visit Diagnosis:  Risk for falls  Weakness  Difficulty walking      Subjective Assessment - 11/04/15 1354    Subjective Patient states that she forgot her cane... might be a good sign. Walking feels a lot better. Not perfect yet. Still kind of stumbles. Finished the antibiotics for the UTI. Pt also adds having bad arthritis in her hands, hips, and back yesterday and had to take pain medication. Tired  today from that. Arthritis feels better today.  No burning in feet like it used to.    Pertinent History Pt states having balance difficulties for about 5 years. Feels like both feet have numbness and burning (started after balance problems). Her MD stated that she had neuropathy. Has difficulty sleeping secondary to increased LE burning sensation plantar suface of her feet.  Pt states she feels that the issues of her feet may play a factor in her balance problems.  Pt state that she sometimes walks by herself  short distances without AD but holding onto furniture. Does not use any AD. Does not leave home without assistance from another person for safety.  Currently has difficulty with  walking. Has not had PT for her balance challenges yet.  Currently lives in a one story home without steps to enter.  Pt states that she also had pneumonia about 2 weeks ago. Currently being followe by a PA. Feels better but still has some problems breathing.    Patient Stated Goals Pt states wanting to be able to walk better.    Currently in Pain? No/denies   Multiple Pain Sites No    Patient also states not eating lunch.   Objectives:   There-ex  Directed patient with SLS on R LE with emphasis on R glute med use  10x5 seconds for 3 sets,  Gait without AD 160 ft CGA (stumbled 1x, pt able to self recover), 136 ft CGA with occasional use of SPC,  Sit <> stand from regular chair with red band resisting hip abduction/ER 10x2  Side stepping using SPC 32 ft each way Standing hip abduction with bilateral UE assist 10x each LE  Improved exercise technique, movement at target joints, use of target muscles after mod verbal, visual, tactile cues.    Able to ambulate without AD, though some unsteadiness today, stumbled 1x (able to recover herself). Needed cues to increase base of support  and foot clearance. Demonstrates R lateral lean and decreased femoral and pelvic control during R LE stance phase without AD.  Demonstrates fatigue today, needing rest breaks.                           PT Education - 11/04/15 1417    Education provided Yes   Education Details ther-ex   [employer]) Educated Patient   Methods Explanation;Demonstration;Tactile cues;Verbal cues   Comprehension Verbalized understanding;Returned demonstration             PT Long Term Goals - 10/28/15 1434    PT LONG TERM GOAL #1   Title Patient will be independent with her HEP to promote LE strength and balance.    Time 4   Period  Weeks   Status On-going   PT LONG TERM GOAL #2   Title Patient will improve bilateral LE strength to promote ability to perform standing tasks and ambulate.    Time 4   Period Weeks   Status Partially Met   PT LONG TERM GOAL #3   Title Patient will improve BERG blance score to at least 46/56 as a demonstration of improved balance.    Time 4   Period Weeks   Status On-going               Plan - 11/04/15 1417    Clinical Impression Statement Able to ambulate without AD, though some unsteadiness today, stumbled 1x (able to recover herself). Needed cues to increase base of support and foot clearance. Demonstrates R lateral lean and decreased femoral and pelvic control during R LE stance phase without AD.  Demonstrates fatigue today, needing rest breaks.    Pt will benefit from skilled therapeutic intervention in order to improve on the following deficits Decreased strength;Decreased balance;Difficulty walking   Rehab Potential Good   Clinical Impairments Affecting Rehab Potential age, chronicity of condition   PT Frequency 2x / week   PT Duration 4 weeks   PT Treatment/Interventions Manual techniques;Therapeutic exercise;Therapeutic activities;Gait training;Neuromuscular re-education   PT Next Visit Plan balance activities, LE strengthening   Consulted and Agree with Plan of Care Patient        Problem List There are no active problems to display for this patient.  Joneen Boers PT, DPT   11/04/2015, 2:50 PM  [facility] [address] [place], [address] Phone: [phone]   Fax:  [phone]  Name: KAELEA GATHRIGHT MRN: [id number] Date of Birth: 07-20-1928

## 2015-11-11 ENCOUNTER — Ambulatory Visit: Payer: Commercial Managed Care - HMO

## 2015-11-11 DIAGNOSIS — R262 Difficulty in walking, not elsewhere classified: Secondary | ICD-10-CM

## 2015-11-11 DIAGNOSIS — R531 Weakness: Secondary | ICD-10-CM

## 2015-11-11 DIAGNOSIS — Z9181 History of falling: Secondary | ICD-10-CM | POA: Diagnosis not present

## 2015-11-11 NOTE — Therapy (Signed)
[facility] [address], [place], [address] Phone: [phone]   Fax:  [phone]  Physical Therapy Treatment  Patient Details  Name: Victoria Esparza MRN: [id number] Date of Birth: [birth year] Referring Provider: Cynda Familia, MD  Encounter Date: 11/11/2015      PT End of Session - 11/11/15 1307    Visit Number 15   Number of Visits 25   Date for PT Re-Evaluation 11/26/15   Authorization Type 6   Authorization Time Period of 10   PT Start Time 1307   PT Stop Time 1353   PT Time Calculation (min) 46 min   Equipment Utilized During Treatment Gait belt  SPC   Activity Tolerance Patient tolerated treatment well   Behavior During Therapy [facility] for tasks assessed/performed      Past Medical History  Diagnosis Date  . Hypertension   . Arthritis     No past surgical history on file.  There were no vitals filed for this visit.  Visit Diagnosis:  Risk for falls  Weakness  Difficulty walking      Subjective Assessment - 11/11/15 1310    Subjective Pt states waking up with soreness in her L foot which comes and goes. 5/10 sorenss L foot   Pertinent History Pt states having balance difficulties for about 5 years. Feels like both feet have numbness and burning (started after balance problems). Her MD stated that she had neuropathy. Has difficulty sleeping secondary to increased LE burning sensation plantar suface of her feet.  Pt states she feels that the issues of her feet may play a factor in her balance problems.  Pt state that she sometimes walks by herself short distances without AD but holding onto furniture. Does not use any AD. Does not leave home without assistance from another person for safety.  Currently has difficulty with  walking. Has not had PT for her balance challenges yet.  Currently lives in a one story home without steps to enter.  Pt states that she also had pneumonia about 2 weeks  ago. Currently being followe by a PA. Feels better but still has some problems breathing.    Patient Stated Goals Pt states wanting to be able to walk better.    Currently in Pain? Yes   Pain Score 5    Pain Location --  L foot   Pain Orientation Left   Pain Descriptors / Indicators Sore   Multiple Pain Sites No       Objectives:    There-ex  Directed patient with seated L LE knee extension (neural flossing) 10x3 (decreased dorsal foot soreness), Manual L ankle PF 10x3 (no soreness afterwards) Sit <> stand from regular chair with arms with green band resisting hip abduction/ER 5x3 (upgraded band intensity) Forward step up and over Air Ex pad 5x3 each LE with finger touch to no UE assist (CGA to min A from PT) to promote balance, cues for technique and transferring body weight onto stance foot, (diificulty with SLS on L LE) SLS on R and L LE with emphasis on glute med use at stance LE 10x2 with 5 second holds to help decrease pelvic drop and hip adduction during gait Gait without use of AD (CGA from PT) 100 ft x 2 with cues on increasing base of support (feet further apart to prevent scissoring pattern)   Improved exercise technique, movement at target joints, use of target muscles, placing center of gravity over  base of support after mod verbal, visual, tactile cues.     No L foot soreness after neural glides. Improved ability to ambulate without AD when cued to increase base of support. Continue working on glute med strength during single leg stand to promote stability during stance phase of gait.                        PT Education - 11/11/15 1318    Education provided Yes   Education Details ther-ex   [employer]) Educated Patient   Methods Explanation;Demonstration;Tactile cues;Verbal cues   Comprehension Verbalized understanding;Returned demonstration             PT Long Term Goals - 10/28/15 1434    PT LONG TERM GOAL #1   Title Patient will be  independent with her HEP to promote LE strength and balance.    Time 4   Period Weeks   Status On-going   PT LONG TERM GOAL #2   Title Patient will improve bilateral LE strength to promote ability to perform standing tasks and ambulate.    Time 4   Period Weeks   Status Partially Met   PT LONG TERM GOAL #3   Title Patient will improve BERG blance score to at least 46/56 as a demonstration of improved balance.    Time 4   Period Weeks   Status On-going               Plan - 11/11/15 1318    Clinical Impression Statement No L foot soreness after neural glides. Improved ability to ambulate without AD when cued to increase base of support. Continue working on glute med strength during single leg stand to promote stability during stance phase of gait.    Pt will benefit from skilled therapeutic intervention in order to improve on the following deficits Decreased strength;Decreased balance;Difficulty walking   Rehab Potential Good   Clinical Impairments Affecting Rehab Potential age, chronicity of condition   PT Frequency 2x / week   PT Duration 4 weeks   PT Treatment/Interventions Manual techniques;Therapeutic exercise;Therapeutic activities;Gait training;Neuromuscular re-education   PT Next Visit Plan balance activities, LE strengthening   Consulted and Agree with Plan of Care Patient        Problem List There are no active problems to display for this patient.   Joneen Boers PT, DPT   11/11/2015, 2:09 PM  [facility] [address] [place], [address] Phone: [phone]   Fax:  [phone]  Name: Victoria Esparza MRN: [id number] Date of Birth: 1928-09-13

## 2015-11-18 DIAGNOSIS — M0609 Rheumatoid arthritis without rheumatoid factor, multiple sites: Secondary | ICD-10-CM | POA: Diagnosis not present

## 2015-11-23 DIAGNOSIS — G250 Essential tremor: Secondary | ICD-10-CM | POA: Diagnosis not present

## 2015-11-23 DIAGNOSIS — M0609 Rheumatoid arthritis without rheumatoid factor, multiple sites: Secondary | ICD-10-CM | POA: Diagnosis not present

## 2015-11-23 DIAGNOSIS — R350 Frequency of micturition: Secondary | ICD-10-CM | POA: Diagnosis not present

## 2015-11-23 DIAGNOSIS — Z23 Encounter for immunization: Secondary | ICD-10-CM | POA: Diagnosis not present

## 2015-11-23 DIAGNOSIS — L4050 Arthropathic psoriasis, unspecified: Secondary | ICD-10-CM | POA: Diagnosis not present

## 2015-11-24 DIAGNOSIS — R350 Frequency of micturition: Secondary | ICD-10-CM | POA: Diagnosis not present

## 2015-11-24 DIAGNOSIS — L4050 Arthropathic psoriasis, unspecified: Secondary | ICD-10-CM | POA: Diagnosis not present

## 2015-11-24 DIAGNOSIS — M0609 Rheumatoid arthritis without rheumatoid factor, multiple sites: Secondary | ICD-10-CM | POA: Diagnosis not present

## 2015-11-24 DIAGNOSIS — Z23 Encounter for immunization: Secondary | ICD-10-CM | POA: Diagnosis not present

## 2015-11-24 DIAGNOSIS — G250 Essential tremor: Secondary | ICD-10-CM | POA: Diagnosis not present

## 2015-12-01 ENCOUNTER — Ambulatory Visit: Payer: Commercial Managed Care - HMO

## 2015-12-23 DIAGNOSIS — N390 Urinary tract infection, site not specified: Secondary | ICD-10-CM | POA: Diagnosis not present

## 2015-12-23 DIAGNOSIS — R399 Unspecified symptoms and signs involving the genitourinary system: Secondary | ICD-10-CM | POA: Diagnosis not present

## 2015-12-31 ENCOUNTER — Ambulatory Visit: Payer: Self-pay | Admitting: Urology

## 2016-01-04 DIAGNOSIS — M0609 Rheumatoid arthritis without rheumatoid factor, multiple sites: Secondary | ICD-10-CM | POA: Diagnosis not present

## 2016-01-04 DIAGNOSIS — G250 Essential tremor: Secondary | ICD-10-CM | POA: Diagnosis not present

## 2016-01-04 DIAGNOSIS — M810 Age-related osteoporosis without current pathological fracture: Secondary | ICD-10-CM | POA: Diagnosis not present

## 2016-01-04 DIAGNOSIS — Z79899 Other long term (current) drug therapy: Secondary | ICD-10-CM | POA: Diagnosis not present

## 2016-01-05 ENCOUNTER — Encounter: Payer: Self-pay | Admitting: *Deleted

## 2016-01-05 DIAGNOSIS — L4050 Arthropathic psoriasis, unspecified: Secondary | ICD-10-CM | POA: Insufficient documentation

## 2016-01-06 ENCOUNTER — Encounter: Payer: Self-pay | Admitting: Obstetrics and Gynecology

## 2016-01-06 ENCOUNTER — Ambulatory Visit (INDEPENDENT_AMBULATORY_CARE_PROVIDER_SITE_OTHER): Payer: Commercial Managed Care - HMO | Admitting: Obstetrics and Gynecology

## 2016-01-06 VITALS — BP 166/82 | HR 84 | Resp 16 | Ht 63.0 in | Wt 147.5 lb

## 2016-01-06 DIAGNOSIS — R300 Dysuria: Secondary | ICD-10-CM | POA: Diagnosis not present

## 2016-01-06 DIAGNOSIS — R3129 Other microscopic hematuria: Secondary | ICD-10-CM | POA: Diagnosis not present

## 2016-01-06 DIAGNOSIS — N952 Postmenopausal atrophic vaginitis: Secondary | ICD-10-CM | POA: Diagnosis not present

## 2016-01-06 LAB — URINALYSIS, COMPLETE
Bilirubin, UA: NEGATIVE
Glucose, UA: NEGATIVE
Ketones, UA: NEGATIVE
Nitrite, UA: NEGATIVE
PH UA: 7 (ref 5.0–7.5)
Protein, UA: NEGATIVE
RBC, UA: NEGATIVE
SPEC GRAV UA: 1.02 (ref 1.005–1.030)
Urobilinogen, Ur: 0.2 mg/dL (ref 0.2–1.0)

## 2016-01-06 LAB — BLADDER SCAN AMB NON-IMAGING: Scan Result: 105

## 2016-01-06 LAB — MICROSCOPIC EXAMINATION: RBC, UA: NONE SEEN /hpf (ref 0–?)

## 2016-01-06 NOTE — [facility] Notes (Signed)
01/06/2016 4:14 PM   Victoria Esparza 30-Jul-1928 [id number]  Referring provider: Juluis Pitch, MD [address], [place] [address]  Chief Complaint  Patient presents with  . Dysuria  . Establish Care    HPI: Patient is an 82 year old female presenting today with her daughter with complaints of recurrent urinary tract infections. She reports symptoms of urinary frequency and occasional urge incontinence when she feels that she has an infection. She does have a baseline level of incontinence is wears pads on a daily basis. She states when she has an infection she begins to feel very confused.  No gross hematuria, flank pain, dysuria.    After reviewing patient's previous labs it was noted that she has experienced multiple documented episodes of microscopic hematuria in the absence of infection.   No vaginal complaints.   Former smoker 3ppd x 50 years.   Urine Cultures 12/23/15 >100,000 [place] Strep/ MUG 11/24/15 MUG 10/21/15 >100,000  Enterococcus species  7/716 MUG  With 4-10 RBCs on UA PMH: Past Medical History  Diagnosis Date  . Hypertension   . Arthritis   . A-fib ([place])   . COPD (chronic obstructive pulmonary disease) ([place])   . Cognitive dysfunction   . Colon adenoma   . Depression   . Familial tremor   . HLD (hyperlipidemia)   . Osteoarthritis   . Osteoporosis   . Ovarian cyst   . Psoriatic arthropathy ([place])   . Rotator cuff syndrome   . Sigmoid diverticulitis     Surgical History: Past Surgical History  Procedure Laterality Date  . Abdominal hysterectomy    . Colonoscopy      Home Medications:    Medication List       This list is accurate as of: 01/06/16 11:59 PM.  Always use your most recent med list.               alendronate 70 MG tablet  Commonly known as:  FOSAMAX  Take 70 mg by mouth once a week. Take with a full glass of water on an empty stomach.     aspirin 325 MG EC  tablet  Take 325 mg by mouth daily.     FISH OIL PO  Take by mouth.     fluticasone 50 MCG/ACT nasal spray  Commonly known as:  FLONASE  Place 2 sprays into both nostrils daily.     folic acid 1 MG tablet  Commonly known as:  FOLVITE  Take 1 mg by mouth daily.     metoprolol tartrate 25 MG tablet  Commonly known as:  LOPRESSOR  Take 25 mg by mouth 2 (two) times daily.     RA NAPROXEN SODIUM 220 MG tablet  Generic drug:  naproxen sodium  Take by mouth.     RA VITAMIN B-12 TR 1000 MCG Tbcr  Generic drug:  Cyanocobalamin  Take by mouth.     SM CALCIUM 500/VITAMIN D3 500-400 MG-UNIT tablet  Generic drug:  calcium-vitamin D  Take by mouth.     traMADol 50 MG tablet  Commonly known as:  ULTRAM  Take 50 mg by mouth every 6 (six) hours as needed.        Allergies: No Known Allergies  Family History: Family History  Problem Relation Age of Onset  . Diabetes Mother   . Ovarian cancer Mother   . Lung cancer Father   . Aneurysm Brother   . Diabetes Brother   .  Breast cancer Sister   . Colon cancer Sister   . Diabetes Sister     Social History:  reports that she quit smoking about 20 years ago. She does not have any smokeless tobacco history on file. She reports that she does not drink alcohol. Her drug history is not on file.  ROS: UROLOGY Frequent Urination?: Yes Hard to postpone urination?: No Burning/pain with urination?: No Get up at night to urinate?: Yes Leakage of urine?: No Urine stream starts and stops?: No Trouble starting stream?: No Do you have to strain to urinate?: No Blood in urine?: No Urinary tract infection?: Yes Sexually transmitted disease?: No Injury to kidneys or bladder?: No Painful intercourse?: No Weak stream?: No Currently pregnant?: No Vaginal bleeding?: No Last menstrual period?: n  Gastrointestinal Nausea?: No Vomiting?: No Indigestion/heartburn?: No Diarrhea?: No Constipation?: Yes  Constitutional Fever: No Night  sweats?: No Weight loss?: No Fatigue?: No  Skin Skin rash/lesions?: No Itching?: No  Eyes Blurred vision?: Yes Double vision?: No  Ears/Nose/Throat Sore throat?: No Sinus problems?: No  Hematologic/Lymphatic Swollen glands?: No Easy bruising?: Yes  Cardiovascular Leg swelling?: No Chest pain?: No  Respiratory Cough?: No Shortness of breath?: No  Endocrine Excessive thirst?: No  Musculoskeletal Back pain?: Yes Joint pain?: Yes  Neurological Headaches?: No Dizziness?: No  Psychologic Depression?: Yes Anxiety?: Yes  Physical Exam: BP 166/82 mmHg  Pulse 84  Resp 16  Ht 5\' 3"  (1.6 m)  Wt 147 lb 8 oz (66.906 kg)  BMI 26.14 kg/m2  Constitutional:  Alert and oriented, No acute distress. HEENT:  AT, moist mucus membranes.  Trachea midline, no masses. Cardiovascular: No clubbing, cyanosis, or edema. Respiratory: Normal respiratory effort, no increased work of breathing. GI: Abdomen is soft, nontender, nondistended, no abdominal masses GU: No CVA tenderness.  Pelvic: external genitalia without lesions or rashes, atrophic mucosa with narrow introitus Skin: No rashes, bruises or suspicious lesions. Lymph: No cervical or inguinal adenopathy. Neurologic: Grossly intact, no focal deficits, moving all 4 extremities. Psychiatric: Normal mood and affect.  Laboratory Data:   Urinalysis Results for orders placed or performed in visit on 01/06/16  Microscopic Examination  Result Value Ref Range   WBC, UA 0-5 0 -  5 /hpf   RBC, UA None seen 0 -  2 /hpf   Epithelial Cells (non renal) 0-10 0 - 10 /hpf   Crystals Present (A) N/A   Crystal Type Amorphous Sediment N/A   Bacteria, UA Moderate (A) None seen/Few  Urinalysis, Complete  Result Value Ref Range   Specific Gravity, UA 1.020 1.005 - 1.030   pH, UA 7.0 5.0 - 7.5   Color, UA Yellow Yellow   Appearance Ur Cloudy (A) Clear   Leukocytes, UA Trace (A) Negative   Protein, UA Negative Negative/Trace   Glucose,  UA Negative Negative   Ketones, UA Negative Negative   RBC, UA Negative Negative   Bilirubin, UA Negative Negative   Urobilinogen, Ur 0.2 0.2 - 1.0 mg/dL   Nitrite, UA Negative Negative   Microscopic Examination See below:   BLADDER SCAN AMB NON-IMAGING  Result Value Ref Range   Scan Result 105 mL     Pertinent Imaging:   Assessment & Plan:    1. Recurrent UTI- UTI prevention strategies discussed.  Good perineal hygiene reviewed. Patient is encouraged to increase daily water intake, start cranberry supplements to prevent invasive colonization along the urinary tract and probiotics, especially lactobacillus to restore normal vaginal flora.  2. Microscopic UTI- We discussed  the differential diagnosis for microscopic hematuria including nephrolithiasis, renal or upper tract tumors, bladder stones, UTIs, or bladder tumors as well as undetermined etiologies. Per AUA guidelines, I did recommend complete microscopic hematuria evaluation including CTU, possible urine cytology, and office cystoscopy.   3. Vaginal Atrophy- provided samples of [place] today.    There are no diagnoses linked to this encounter.  Return for CT Urogram results and cystoscopy.  These notes generated with voice recognition software. I apologize for typographical errors.  Herbert Moors, [facility] [address], [place], [place] [address] [phone]

## 2016-01-06 NOTE — Patient Instructions (Addendum)
Vaginal Estrogen Cream apply a blueberry sized amount to vaginal opening using finger tip nightly for 2 weeks then use 3 times weekly before bed.   Cystoscopy Cystoscopy is a procedure that is used to help your caregiver diagnose and sometimes treat conditions that affect your lower urinary tract. Your lower urinary tract includes your bladder and the tube through which urine passes from your bladder out of your body (urethra). Cystoscopy is performed with a thin, tube-shaped instrument (cystoscope). The cystoscope has lenses and a light at the end so that your caregiver can see inside your bladder. The cystoscope is inserted at the entrance of your urethra. Your caregiver guides it through your urethra and into your bladder. There are two main types of cystoscopy:  Flexible cystoscopy (with a flexible cystoscope).  Rigid cystoscopy (with a rigid cystoscope). Cystoscopy may be recommended for many conditions, including:  Urinary tract infections.  Blood in your urine (hematuria).  Loss of bladder control (urinary incontinence) or overactive bladder.  Unusual cells found in a urine sample.  Urinary blockage.  Painful urination. Cystoscopy may also be done to remove a sample of your tissue to be checked under a microscope (biopsy). It may also be done to remove or destroy bladder stones. LET YOUR CAREGIVER KNOW ABOUT:  Allergies to food or medicine.  Medicines taken, including vitamins, herbs, eyedrops, over-the-counter medicines, and creams.  Use of steroids (by mouth or creams).  Previous problems with anesthetics or numbing medicines.  History of bleeding problems or blood clots.  Previous surgery.  Other health problems, including diabetes and kidney problems.  Possibility of pregnancy, if this applies. PROCEDURE The area around the opening to your urethra will be cleaned. A medicine to numb your urethra (local anesthetic) is used. If a tissue sample or stone is removed  during the procedure, you may be given a medicine to make you sleep (general anesthetic). Your caregiver will gently insert the tip of the cystoscope into your urethra. The cystoscope will be slowly glided through your urethra and into your bladder. Sterile fluid will flow through the cystoscope and into your bladder. The fluid will expand and stretch your bladder. This gives your caregiver a better view of your bladder walls. The procedure lasts about 15-20 minutes. AFTER THE PROCEDURE If a local anesthetic is used, you will be allowed to go home as soon as you are ready. If a general anesthetic is used, you will be taken to a recovery area until you are stable. You may have temporary bleeding and burning on urination.   This information is not intended to replace advice given to you by your health care provider. Make sure you discuss any questions you have with your health care provider.   Document Released: 12/02/2000 Document Revised: 12/26/2014 Document Reviewed: 05/28/2012 Elsevier Interactive Patient Education 2016 Elsevier Inc. Hematuria, Adult Hematuria is blood in your urine. It can be caused by a bladder infection, kidney infection, prostate infection, kidney stone, or cancer of your urinary tract. Infections can usually be treated with medicine, and a kidney stone usually will pass through your urine. If neither of these is the cause of your hematuria, further workup to find out the reason may be needed. It is very important that you tell your health care provider about any blood you see in your urine, even if the blood stops without treatment or happens without causing pain. Blood in your urine that happens and then stops and then happens again can be a symptom of a  very serious condition. Also, pain is not a symptom in the initial stages of many urinary cancers. HOME CARE INSTRUCTIONS   Drink lots of fluid, 3-4 quarts a day. If you have been diagnosed with an infection, cranberry juice  is especially recommended, in addition to large amounts of water.  Avoid caffeine, tea, and carbonated beverages because they tend to irritate the bladder.  Avoid alcohol because it may irritate the prostate.  Take all medicines as directed by your health care provider.  If you were prescribed an antibiotic medicine, finish it all even if you start to feel better.  If you have been diagnosed with a kidney stone, follow your health care provider's instructions regarding straining your urine to catch the stone.  Empty your bladder often. Avoid holding urine for long periods of time.  After a bowel movement, women should cleanse front to back. Use each tissue only once.  Empty your bladder before and after sexual intercourse if you are a female. SEEK MEDICAL CARE IF:  You develop back pain.  You have a fever.  You have a feeling of sickness in your stomach (nausea) or vomiting.  Your symptoms are not better in 3 days. Return sooner if you are getting worse. SEEK IMMEDIATE MEDICAL CARE IF:   You develop severe vomiting and are unable to keep the medicine down.  You develop severe back or abdominal pain despite taking your medicines.  You begin passing a large amount of blood or clots in your urine.  You feel extremely weak or faint, or you pass out. MAKE SURE YOU:   Understand these instructions.  Will watch your condition.  Will get help right away if you are not doing well or get worse.   This information is not intended to replace advice given to you by your health care provider. Make sure you discuss any questions you have with your health care provider.   Document Released: 12/05/2005 Document Revised: 12/26/2014 Document Reviewed: 08/05/2013 Elsevier Interactive Patient Education [employer].

## 2016-01-07 ENCOUNTER — Ambulatory Visit: Payer: Self-pay | Admitting: Urology

## 2016-01-21 ENCOUNTER — Ambulatory Visit
Admission: RE | Admit: 2016-01-21 | Discharge: 2016-01-21 | Disposition: A | Discharge status: Home / Self Care | Payer: Commercial Managed Care - HMO | Source: Ambulatory Visit | Attending: Obstetrics and Gynecology | Admitting: Obstetrics and Gynecology

## 2016-01-21 DIAGNOSIS — N281 Cyst of kidney, acquired: Secondary | ICD-10-CM | POA: Diagnosis not present

## 2016-01-21 DIAGNOSIS — Z87440 Personal history of urinary (tract) infections: Secondary | ICD-10-CM | POA: Diagnosis not present

## 2016-01-21 DIAGNOSIS — R3129 Other microscopic hematuria: Secondary | ICD-10-CM | POA: Insufficient documentation

## 2016-01-21 DIAGNOSIS — N3941 Urge incontinence: Secondary | ICD-10-CM | POA: Diagnosis not present

## 2016-01-21 DIAGNOSIS — R300 Dysuria: Secondary | ICD-10-CM | POA: Insufficient documentation

## 2016-01-21 DIAGNOSIS — K5730 Diverticulosis of large intestine without perforation or abscess without bleeding: Secondary | ICD-10-CM | POA: Insufficient documentation

## 2016-01-21 DIAGNOSIS — K869 Disease of pancreas, unspecified: Secondary | ICD-10-CM | POA: Insufficient documentation

## 2016-01-21 LAB — POCT I-STAT CREATININE: CREATININE: 0.8 mg/dL (ref 0.44–1.00)

## 2016-01-21 MED ORDER — IOHEXOL 300 MG/ML  SOLN
125.0000 mL | Freq: Once | INTRAMUSCULAR | Status: AC | PRN
  Administered 2016-01-21: 125 mL via INTRAVENOUS

## 2016-01-22 ENCOUNTER — Encounter: Payer: Self-pay | Admitting: Urology

## 2016-01-22 ENCOUNTER — Ambulatory Visit (INDEPENDENT_AMBULATORY_CARE_PROVIDER_SITE_OTHER): Payer: Commercial Managed Care - HMO | Admitting: Urology

## 2016-01-22 VITALS — BP 109/74 | HR 73 | Ht 63.0 in | Wt 147.0 lb

## 2016-01-22 DIAGNOSIS — N952 Postmenopausal atrophic vaginitis: Secondary | ICD-10-CM | POA: Diagnosis not present

## 2016-01-22 DIAGNOSIS — R3129 Other microscopic hematuria: Secondary | ICD-10-CM | POA: Diagnosis not present

## 2016-01-22 DIAGNOSIS — N390 Urinary tract infection, site not specified: Secondary | ICD-10-CM

## 2016-01-22 LAB — URINALYSIS, COMPLETE
Bilirubin, UA: NEGATIVE
GLUCOSE, UA: NEGATIVE
Nitrite, UA: NEGATIVE
Specific Gravity, UA: 1.025 (ref 1.005–1.030)
Urobilinogen, Ur: 0.2 mg/dL (ref 0.2–1.0)
pH, UA: 5.5 (ref 5.0–7.5)

## 2016-01-22 LAB — MICROSCOPIC EXAMINATION: RBC, UA: NONE SEEN /hpf (ref 0–?)

## 2016-01-22 MED ORDER — ESTRADIOL 0.1 MG/GM VA CREA: 1.0000 g | TOPICAL_CREAM | VAGINAL | Status: DC

## 2016-01-23 ENCOUNTER — Encounter: Payer: Self-pay | Admitting: Urology

## 2016-01-23 NOTE — Progress Notes (Signed)
10:11 AM   REALYNN Esparza 01/06/1928 [id number]  Referring provider: Juluis Pitch, MD [address] [place],  [address]  Chief Complaint  Patient presents with  . Cysto    HPI: 82 year old female with microscopic hematuria and recurrent UTIs.  She was scheduled for office cystoscopy to complete her hematuria workup today. CT urogram was also obtained in the interim which was negative for any obvious GU pathology.  She reports symptoms of urinary frequency and occasional urge incontinence when she feels that she has an infection. She does have a baseline level of incontinence is wears pads on a daily basis. She states when she has an infection she begins to feel very confused.  No gross hematuria, flank pain, dysuria.    After reviewing patient's previous labs it was noted that she has experienced multiple documented episodes of microscopic hematuria in the absence of infection.   No vaginal complaints.   Former smoker 3ppd x 50 years.   Urine Cultures 12/23/15 >100,000 [place] Strep/ MUG 11/24/15 MUG 10/21/15 >100,000  Enterococcus species  7/716 MUG  With 4-10 RBCs on UA PMH: Past Medical History  Diagnosis Date  . Hypertension   . Arthritis   . A-fib ([place])   . COPD (chronic obstructive pulmonary disease) ([place])   . Cognitive dysfunction   . Colon adenoma   . Depression   . Familial tremor   . HLD (hyperlipidemia)   . Osteoarthritis   . Osteoporosis   . Ovarian cyst   . Psoriatic arthropathy ([place])   . Rotator cuff syndrome   . Sigmoid diverticulitis     Surgical History: Past Surgical History  Procedure Laterality Date  . Abdominal hysterectomy    . Colonoscopy      Home Medications:    Medication List       This list is accurate as of: 01/22/16 11:59 PM.  Always use your most recent med list.               alendronate 70 MG tablet  Commonly known as:  FOSAMAX  Take 70 mg by mouth once a week. Reported on 01/22/2016     aspirin 325 MG EC  tablet  Take 325 mg by mouth daily.     ENBREL 50 MG/ML injection  Generic drug:  etanercept  Inject into the skin.     estradiol 0.1 MG/GM vaginal cream  Commonly known as:  ESTRACE  Place 1 g vaginally 3 (three) times a week. Peas sized amount per urethra     FISH OIL PO  Take by mouth.     fluticasone 50 MCG/ACT nasal spray  Commonly known as:  FLONASE  Place 2 sprays into both nostrils daily.     folic acid 1 MG tablet  Commonly known as:  FOLVITE  Take 1 mg by mouth daily.     metoprolol tartrate 25 MG tablet  Commonly known as:  LOPRESSOR  Take 25 mg by mouth 2 (two) times daily.     RA NAPROXEN SODIUM 220 MG tablet  Generic drug:  naproxen sodium  Take by mouth. Reported on 01/22/2016     RA VITAMIN B-12 TR 1000 MCG Tbcr  Generic drug:  Cyanocobalamin  Take by mouth.     SM CALCIUM 500/VITAMIN D3 500-400 MG-UNIT tablet  Generic drug:  calcium-vitamin D  Take by mouth.     traMADol 50 MG tablet  Commonly known as:  ULTRAM  Take 50 mg by mouth every 6 (six) hours  as needed.        Allergies: No Known Allergies  Family History: Family History  Problem Relation Age of Onset  . Diabetes Mother   . Ovarian cancer Mother   . Lung cancer Father   . Aneurysm Brother   . Diabetes Brother   . Breast cancer Sister   . Colon cancer Sister   . Diabetes Sister   . Kidney disease Other     nephew  . Bladder Cancer Neg Hx     Social History:  reports that she quit smoking about 20 years ago. She does not have any smokeless tobacco history on file. She reports that she does not drink alcohol. Her drug history is not on file.  ROS: UROLOGY Frequent Urination?: Yes Hard to postpone urination?: No Burning/pain with urination?: No Get up at night to urinate?: Yes Leakage of urine?: No Urine stream starts and stops?: Yes Trouble starting stream?: No Do you have to strain to urinate?: No Blood in urine?: No Urinary tract infection?: No Sexually transmitted  disease?: No Injury to kidneys or bladder?: No Painful intercourse?: No Weak stream?: No Currently pregnant?: No Vaginal bleeding?: No Last menstrual period?: n  Gastrointestinal Nausea?: No Vomiting?: No Indigestion/heartburn?: No Diarrhea?: No Constipation?: No  Constitutional Fever: No Night sweats?: No Weight loss?: Yes Fatigue?: No  Skin Skin rash/lesions?: No Itching?: No  Eyes Blurred vision?: Yes Double vision?: No  Ears/Nose/Throat Sore throat?: No Sinus problems?: Yes  Hematologic/Lymphatic Swollen glands?: No Easy bruising?: No  Cardiovascular Leg swelling?: No Chest pain?: No  Respiratory Cough?: No Shortness of breath?: No  Endocrine Excessive thirst?: No  Musculoskeletal Back pain?: Yes Joint pain?: Yes  Neurological Headaches?: No Dizziness?: No  Psychologic Depression?: No Anxiety?: Yes  Physical Exam: BP 109/74 mmHg  Pulse 73  Ht 5\' 3"  (1.6 m)  Wt 147 lb (66.679 kg)  BMI 26.05 kg/m2  Constitutional:  Alert and oriented, No acute distress. HEENT: [place] AT, moist mucus membranes.  Trachea midline, no masses. Cardiovascular: No clubbing, cyanosis, or edema. Respiratory: Normal respiratory effort, no increased work of breathing. Skin: No rashes, bruises or suspicious lesions. Neurologic: Grossly intact, no focal deficits, moving all 4 extremities. Psychiatric: Normal mood and affect.   Urinalysis Results for orders placed or performed in visit on 01/22/16  Microscopic Examination  Result Value Ref Range   WBC, UA >30W 0 -  5 /hpf   RBC, UA None seen 0 -  2 /hpf   Epithelial Cells (non renal) >10E 0 - 10 /hpf   Bacteria, UA Many (A) None seen/Few  Urinalysis, Complete  Result Value Ref Range   Specific Gravity, UA 1.025 1.005 - 1.030   pH, UA 5.5 5.0 - 7.5   Color, UA Yellow Yellow   Appearance Ur Clear Clear   Leukocytes, UA Trace (A) Negative   Protein, UA Trace (A) Negative/Trace   Glucose, UA Negative Negative    Ketones, UA Trace (A) Negative   RBC, UA Trace (A) Negative   Bilirubin, UA Negative Negative   Urobilinogen, Ur 0.2 0.2 - 1.0 mg/dL   Nitrite, UA Negative Negative   Microscopic Examination See below:     Pertinent Imaging: CLINICAL DATA: 82 year old female with history of recurrent urinary tract infections. Occasional urge incontinence. No gross hematuria, flank pain with dysuria. Microscopic hematuria.  EXAM: CT ABDOMEN AND PELVIS WITHOUT AND WITH CONTRAST  TECHNIQUE: Multidetector CT imaging of the abdomen and pelvis was performed following the standard protocol before and following  the bolus administration of intravenous contrast.  CONTRAST: 125 mL of Omnipaque 300.  COMPARISON: CT the abdomen and pelvis 03/08/2012.  FINDINGS: Lower chest: Small hiatal hernia. Calcifications of the aortic valve. Atherosclerotic calcifications in the right coronary artery.  Hepatobiliary: Well-defined 1.6 x 1.2 cm low-attenuation lesion in segment 7 of the liver is compatible with a small simple cyst. Small calcified granulomas in the liver. No suspicious hepatic lesions. No intra or extrahepatic biliary ductal dilatation. Gallbladder is normal in appearance.  Pancreas: 10 mm low-attenuation lesion in the pancreatic head (image 29 of series 4) is unchanged compared to prior study 03/08/2012, and appears similar to remote prior examination from 11/01/2011, at which point the lesion measured only 9 mm. No other suspicious appearing pancreatic lesions are noted. No intra or extrahepatic biliary ductal dilatation. No pancreatic or peripancreatic inflammatory changes.  Spleen: Unremarkable.  Adrenals/Urinary Tract: No calcifications are identified within the collecting system of either kidney, along the course of either ureter, or within the lumen of the urinary bladder. No hydroureteronephrosis or perinephric stranding is noted to suggest urinary tract obstruction at this  time. 2.1 cm exophytic low-attenuation nonenhancing lesion in the interpolar region of the left kidney is compatible with a simple cyst. Several other smaller sub cm low-attenuation lesions are noted in the kidneys bilaterally, too small to definitively characterize, but also favored to represent small cysts. Postcontrast delayed images demonstrate no definite filling defect within the collecting system of either kidney, along the course of either ureter, or within the lumen of the urinary bladder to strongly suggest the presence of a urothelial neoplasm at this time. The appearance of the urinary bladder is normal. Bilateral adrenal glands are normal in appearance.  Stomach/Bowel: Normal appearance of the stomach. No pathologic dilatation of small bowel or colon. Numerous colonic diverticulae are noted, without surrounding inflammatory changes to suggest an acute diverticulitis at this time. Normal appendix.  Vascular/Lymphatic: Atherosclerosis throughout the abdominal and pelvic vasculature, without evidence of aneurysm or dissection. No lymphadenopathy in the pelvis.  Reproductive: Status post hysterectomy. Ovaries are atrophic.  Other: No significant volume of ascites. No pneumoperitoneum.  Musculoskeletal: Old compression fracture of L1 with approximately 30% loss of anterior vertebral body height. There are no aggressive appearing lytic or blastic lesions noted in the visualized portions of the skeleton.  IMPRESSION: 1. No definite source for microhematuria identified on today's examination. 2. 2.1 cm simple cyst in the interpolar region of the left kidney. There are other sub cm low-attenuation lesions in the kidneys bilaterally which are too small to definitively characterize, but are also favored to represent tiny cysts. 3. Unchanged 1 cm low-attenuation lesion in the head of the pancreas, which is similar compared to prior examinations dating back to 2012,  presumably a benign lesion such as a tiny pancreatic pseudocyst. 4. Colonic diverticulosis without evidence of acute diverticulitis at this time. 5. Atherosclerosis. 6. Additional incidental findings, as above.   Electronically Signed  By: Vinnie Langton M.D.  On: 01/21/2016 13:58  Assessment & Plan:    1. Recurrent UTI- UA today highly suspicious for infection. She remains asymptomatic.  Plan to defer cystoscopy today and repeat once UTI treated given concern for possible false positive in the setting of bladder inflammation. Patient and her daughter are agreeable with this plan.  Briefly reviewed UTI prevention strategies and prescription for estrogen cream given (anatomy reviewed). -UCx -treated based on sensitivity data (advised to call sooner if develops symptoms over weekend)  2. Microscopic UTI-  CT  urogram results reviewed today  3. Vaginal Atrophy- as above   Return for reschedule cysto in 2-3 weeks.  Hollice Espy, MD  [facility] [address] [place], [place] [address] [phone]

## 2016-01-24 LAB — CULTURE, URINE COMPREHENSIVE

## 2016-01-25 ENCOUNTER — Telehealth: Payer: Self-pay

## 2016-01-25 DIAGNOSIS — N390 Urinary tract infection, site not specified: Secondary | ICD-10-CM

## 2016-01-25 MED ORDER — AMOXICILLIN-POT CLAVULANATE 875-125 MG PO TABS: 1.0000 | ORAL_TABLET | Freq: Two times a day (BID) | ORAL | Status: AC

## 2016-01-25 NOTE — Telephone Encounter (Signed)
-----   Message from Hollice Espy, MD sent at 01/24/2016  2:54 PM EST ----- Please call and treat with Augmentin bid x 3 days.    Hollice Espy, MD

## 2016-01-25 NOTE — Telephone Encounter (Signed)
LMOM-medication sent to pharmacy 

## 2016-01-26 NOTE — Telephone Encounter (Signed)
Spoke with Victoria Esparza in reference to pt UTI. Made aware of abx. Manuela Schwartz stated they picked up medication yesterday.

## 2016-02-03 ENCOUNTER — Encounter: Payer: Self-pay | Admitting: Urology

## 2016-02-03 ENCOUNTER — Ambulatory Visit (INDEPENDENT_AMBULATORY_CARE_PROVIDER_SITE_OTHER): Payer: Commercial Managed Care - HMO | Admitting: Urology

## 2016-02-03 VITALS — BP 163/79 | HR 67 | Ht 63.0 in | Wt 147.4 lb

## 2016-02-03 DIAGNOSIS — R3129 Other microscopic hematuria: Secondary | ICD-10-CM

## 2016-02-03 MED ORDER — LIDOCAINE HCL 2 % EX GEL
1.0000 "application " | Freq: Once | CUTANEOUS | Status: AC
  Administered 2016-02-03: 1 via URETHRAL

## 2016-02-03 MED ORDER — CIPROFLOXACIN HCL 500 MG PO TABS
500.0000 mg | ORAL_TABLET | Freq: Once | ORAL | Status: AC
  Administered 2016-02-03: 500 mg via ORAL

## 2016-02-03 NOTE — Progress Notes (Signed)
4:32 PM  02/03/2016   Victoria Esparza 1928-07-28 [id number]  Referring provider: Juluis Pitch, MD [address] [place], [place] [address]  Chief Complaint  Patient presents with  . Cysto    HPI: 82 year old female with microscopic hematuria and recurrent UTIs who presents to the office today to complete her hematuria work up with office cystoscopy.    CT urogram was also obtained in the interim which was negative for any obvious GU pathology.  She reports symptoms of urinary frequency and occasional urge incontinence when she feels that she has an infection. She does have a baseline level of incontinence is wears pads on a daily basis. She states when she has an infection she begins to feel very confused.  No gross hematuria, flank pain, dysuria.    After reviewing patient's previous labs it was noted that she has experienced multiple documented episodes of microscopic hematuria in the absence of infection.   No vaginal complaints.   Former smoker 3ppd x 50 years.   PMH: Past Medical History  Diagnosis Date  . Hypertension   . Arthritis   . A-fib ([place])   . COPD (chronic obstructive pulmonary disease) ([place])   . Cognitive dysfunction   . Colon adenoma   . Depression   . Familial tremor   . HLD (hyperlipidemia)   . Osteoarthritis   . Osteoporosis   . Ovarian cyst   . Psoriatic arthropathy ([place])   . Rotator cuff syndrome   . Sigmoid diverticulitis     Surgical History: Past Surgical History  Procedure Laterality Date  . Abdominal hysterectomy    . Colonoscopy      Home Medications:    Medication List       This list is accurate as of: 02/03/16  4:32 PM.  Always use your most recent med list.               alendronate 70 MG tablet  Commonly known as:  FOSAMAX  Take 70 mg by mouth once a week. Reported on 01/22/2016     aspirin 325 MG EC tablet  Take 325 mg by mouth daily.     ENBREL 50 MG/ML injection  Generic drug:  etanercept  Inject into  the skin.     estradiol 0.1 MG/GM vaginal cream  Commonly known as:  ESTRACE  Place 1 g vaginally 3 (three) times a week. Peas sized amount per urethra     FISH OIL PO  Take by mouth.     fluticasone 50 MCG/ACT nasal spray  Commonly known as:  FLONASE  Place 2 sprays into both nostrils daily.     folic acid 1 MG tablet  Commonly known as:  FOLVITE  Take 1 mg by mouth daily.     metoprolol tartrate 25 MG tablet  Commonly known as:  LOPRESSOR  Take 25 mg by mouth 2 (two) times daily.     RA NAPROXEN SODIUM 220 MG tablet  Generic drug:  naproxen sodium  Take by mouth. Reported on 01/22/2016     RA VITAMIN B-12 TR 1000 MCG Tbcr  Generic drug:  Cyanocobalamin  Take by mouth.     SM CALCIUM 500/VITAMIN D3 500-400 MG-UNIT tablet  Generic drug:  calcium-vitamin D  Take by mouth.     traMADol 50 MG tablet  Commonly known as:  ULTRAM  Take 50 mg by mouth every 6 (six) hours as needed.        Allergies: No Known Allergies  Family  History: Family History  Problem Relation Age of Onset  . Diabetes Mother   . Ovarian cancer Mother   . Lung cancer Father   . Aneurysm Brother   . Diabetes Brother   . Breast cancer Sister   . Colon cancer Sister   . Diabetes Sister   . Kidney disease Other     nephew  . Bladder Cancer Neg Hx     Social History:  reports that she quit smoking about 20 years ago. She does not have any smokeless tobacco history on file. She reports that she does not drink alcohol. Her drug history is not on file.   Physical Exam: BP 163/79 mmHg  Pulse 67  Ht 5\' 3"  (1.6 m)  Wt 147 lb 6.4 oz (66.86 kg)  BMI 26.12 kg/m2  Constitutional:  Alert and oriented, No acute distress. HEENT: [place] AT, moist mucus membranes.  Trachea midline, no masses. Cardiovascular: No clubbing, cyanosis, or edema. Respiratory: Normal respiratory effort, no increased work of breathing. GU: No suprapubic tenderness.  Atrophic vaginitis noted.  Normal urethral meatus.   Skin: No  rashes, bruises or suspicious lesions. Neurologic: Grossly intact, no focal deficits, moving all 4 extremities. Psychiatric: Normal mood and affect.   Urinalysis UA reviewed today, negative, see EPIC  Pertinent Imaging: CLINICAL DATA: 82 year old female with history of recurrent urinary tract infections. Occasional urge incontinence. No gross hematuria, flank pain with dysuria. Microscopic hematuria.  EXAM: CT ABDOMEN AND PELVIS WITHOUT AND WITH CONTRAST  TECHNIQUE: Multidetector CT imaging of the abdomen and pelvis was performed following the standard protocol before and following the bolus administration of intravenous contrast.  CONTRAST: 125 mL of Omnipaque 300.  COMPARISON: CT the abdomen and pelvis 03/08/2012.  FINDINGS: Lower chest: Small hiatal hernia. Calcifications of the aortic valve. Atherosclerotic calcifications in the right coronary artery.  Hepatobiliary: Well-defined 1.6 x 1.2 cm low-attenuation lesion in segment 7 of the liver is compatible with a small simple cyst. Small calcified granulomas in the liver. No suspicious hepatic lesions. No intra or extrahepatic biliary ductal dilatation. Gallbladder is normal in appearance.  Pancreas: 10 mm low-attenuation lesion in the pancreatic head (image 29 of series 4) is unchanged compared to prior study 03/08/2012, and appears similar to remote prior examination from 11/01/2011, at which point the lesion measured only 9 mm. No other suspicious appearing pancreatic lesions are noted. No intra or extrahepatic biliary ductal dilatation. No pancreatic or peripancreatic inflammatory changes.  Spleen: Unremarkable.  Adrenals/Urinary Tract: No calcifications are identified within the collecting system of either kidney, along the course of either ureter, or within the lumen of the urinary bladder. No hydroureteronephrosis or perinephric stranding is noted to suggest urinary tract obstruction at this time.  2.1 cm exophytic low-attenuation nonenhancing lesion in the interpolar region of the left kidney is compatible with a simple cyst. Several other smaller sub cm low-attenuation lesions are noted in the kidneys bilaterally, too small to definitively characterize, but also favored to represent small cysts. Postcontrast delayed images demonstrate no definite filling defect within the collecting system of either kidney, along the course of either ureter, or within the lumen of the urinary bladder to strongly suggest the presence of a urothelial neoplasm at this time. The appearance of the urinary bladder is normal. Bilateral adrenal glands are normal in appearance.  Stomach/Bowel: Normal appearance of the stomach. No pathologic dilatation of small bowel or colon. Numerous colonic diverticulae are noted, without surrounding inflammatory changes to suggest an acute diverticulitis at this time. Normal  appendix.  Vascular/Lymphatic: Atherosclerosis throughout the abdominal and pelvic vasculature, without evidence of aneurysm or dissection. No lymphadenopathy in the pelvis.  Reproductive: Status post hysterectomy. Ovaries are atrophic.  Other: No significant volume of ascites. No pneumoperitoneum.  Musculoskeletal: Old compression fracture of L1 with approximately 30% loss of anterior vertebral body height. There are no aggressive appearing lytic or blastic lesions noted in the visualized portions of the skeleton.  IMPRESSION: 1. No definite source for microhematuria identified on today's examination. 2. 2.1 cm simple cyst in the interpolar region of the left kidney. There are other sub cm low-attenuation lesions in the kidneys bilaterally which are too small to definitively characterize, but are also favored to represent tiny cysts. 3. Unchanged 1 cm low-attenuation lesion in the head of the pancreas, which is similar compared to prior examinations dating back to 2012,  presumably a benign lesion such as a tiny pancreatic pseudocyst. 4. Colonic diverticulosis without evidence of acute diverticulitis at this time. 5. Atherosclerosis. 6. Additional incidental findings, as above.   Electronically Signed  By: Vinnie Langton M.D.  On: 01/21/2016 13:58    Cystoscopy Procedure Note  Patient identification was confirmed, informed consent was obtained, and patient was prepped using Betadine solution.  Lidocaine jelly was administered per urethral meatus.    Preoperative abx where received prior to procedure.    Procedure: - Flexible cystoscope introduced, without any difficulty.   - Thorough search of the bladder revealed:    normal urethral meatus    normal urothelium    no stones    no ulcers     no tumors    no urethral polyps    mild trabeculation  - Ureteral orifices were normal in position and appearance.  Post-Procedure: - Patient tolerated the procedure well  Assessment & Plan:    1. Recurrent UTI-   Briefly reviewed UTI prevention strategies again with patient and daughter Estrogen cream prescribed previously, patient has not been using Reviewed application procedure [id number] and urged her to use this regularly as prescribed  2. Microscopic UTI-  Negative cystoscopy and CT Urogram, no further work up indicated  3. Vaginal Atrophy- as above   F/u PRN   Hollice Espy, MD  [facility] [address], [place], [place] [address] [phone]

## 2016-02-04 LAB — URINALYSIS, COMPLETE
Bilirubin, UA: NEGATIVE
GLUCOSE, UA: NEGATIVE
Ketones, UA: NEGATIVE
LEUKOCYTES UA: NEGATIVE
Nitrite, UA: NEGATIVE
PROTEIN UA: NEGATIVE
RBC, UA: NEGATIVE
SPEC GRAV UA: 1.02 (ref 1.005–1.030)
Urobilinogen, Ur: 0.2 mg/dL (ref 0.2–1.0)
pH, UA: 7.5 (ref 5.0–7.5)

## 2016-02-04 LAB — MICROSCOPIC EXAMINATION
BACTERIA UA: NONE SEEN
RBC, UA: NONE SEEN /hpf (ref 0–?)
WBC, UA: NONE SEEN /hpf (ref 0–?)

## 2016-02-09 ENCOUNTER — Other Ambulatory Visit: Payer: Self-pay

## 2016-02-09 DIAGNOSIS — N952 Postmenopausal atrophic vaginitis: Secondary | ICD-10-CM

## 2016-02-09 MED ORDER — ESTRADIOL 0.1 MG/GM VA CREA: 1.0000 g | TOPICAL_CREAM | VAGINAL | Status: DC

## 2016-02-29 DIAGNOSIS — Z79899 Other long term (current) drug therapy: Secondary | ICD-10-CM | POA: Diagnosis not present

## 2016-02-29 DIAGNOSIS — M0609 Rheumatoid arthritis without rheumatoid factor, multiple sites: Secondary | ICD-10-CM | POA: Diagnosis not present

## 2016-03-07 DIAGNOSIS — M810 Age-related osteoporosis without current pathological fracture: Secondary | ICD-10-CM | POA: Diagnosis not present

## 2016-03-07 DIAGNOSIS — Z79899 Other long term (current) drug therapy: Secondary | ICD-10-CM | POA: Diagnosis not present

## 2016-03-07 DIAGNOSIS — M0609 Rheumatoid arthritis without rheumatoid factor, multiple sites: Secondary | ICD-10-CM | POA: Diagnosis not present

## 2016-03-07 DIAGNOSIS — G250 Essential tremor: Secondary | ICD-10-CM | POA: Diagnosis not present

## 2016-03-21 DIAGNOSIS — R399 Unspecified symptoms and signs involving the genitourinary system: Secondary | ICD-10-CM | POA: Diagnosis not present

## 2016-03-21 DIAGNOSIS — L918 Other hypertrophic disorders of the skin: Secondary | ICD-10-CM | POA: Diagnosis not present

## 2016-03-21 DIAGNOSIS — L03032 Cellulitis of left toe: Secondary | ICD-10-CM | POA: Diagnosis not present

## 2016-03-21 DIAGNOSIS — L03031 Cellulitis of right toe: Secondary | ICD-10-CM | POA: Diagnosis not present

## 2016-03-21 DIAGNOSIS — N390 Urinary tract infection, site not specified: Secondary | ICD-10-CM | POA: Diagnosis not present

## 2016-03-24 DIAGNOSIS — M79674 Pain in right toe(s): Secondary | ICD-10-CM | POA: Diagnosis not present

## 2016-03-24 DIAGNOSIS — M79675 Pain in left toe(s): Secondary | ICD-10-CM | POA: Diagnosis not present

## 2016-03-24 DIAGNOSIS — B351 Tinea unguium: Secondary | ICD-10-CM | POA: Diagnosis not present

## 2016-04-26 DIAGNOSIS — N390 Urinary tract infection, site not specified: Secondary | ICD-10-CM | POA: Diagnosis not present

## 2016-04-27 DIAGNOSIS — R109 Unspecified abdominal pain: Secondary | ICD-10-CM | POA: Diagnosis not present

## 2016-04-27 DIAGNOSIS — R410 Disorientation, unspecified: Secondary | ICD-10-CM | POA: Diagnosis not present

## 2016-04-27 DIAGNOSIS — K5900 Constipation, unspecified: Secondary | ICD-10-CM | POA: Diagnosis not present

## 2016-06-13 DIAGNOSIS — Z79899 Other long term (current) drug therapy: Secondary | ICD-10-CM | POA: Diagnosis not present

## 2016-06-13 DIAGNOSIS — M0609 Rheumatoid arthritis without rheumatoid factor, multiple sites: Secondary | ICD-10-CM | POA: Diagnosis not present

## 2016-06-30 DIAGNOSIS — B078 Other viral warts: Secondary | ICD-10-CM | POA: Diagnosis not present

## 2016-06-30 DIAGNOSIS — L578 Other skin changes due to chronic exposure to nonionizing radiation: Secondary | ICD-10-CM | POA: Diagnosis not present

## 2016-06-30 DIAGNOSIS — L821 Other seborrheic keratosis: Secondary | ICD-10-CM | POA: Diagnosis not present

## 2016-06-30 DIAGNOSIS — L858 Other specified epidermal thickening: Secondary | ICD-10-CM | POA: Diagnosis not present

## 2016-06-30 DIAGNOSIS — D485 Neoplasm of uncertain behavior of skin: Secondary | ICD-10-CM | POA: Diagnosis not present

## 2016-07-11 DIAGNOSIS — M810 Age-related osteoporosis without current pathological fracture: Secondary | ICD-10-CM | POA: Diagnosis not present

## 2016-07-11 DIAGNOSIS — R399 Unspecified symptoms and signs involving the genitourinary system: Secondary | ICD-10-CM | POA: Diagnosis not present

## 2016-07-11 DIAGNOSIS — G250 Essential tremor: Secondary | ICD-10-CM | POA: Diagnosis not present

## 2016-07-11 DIAGNOSIS — M0609 Rheumatoid arthritis without rheumatoid factor, multiple sites: Secondary | ICD-10-CM | POA: Diagnosis not present

## 2016-07-11 DIAGNOSIS — R260 Ataxic gait: Secondary | ICD-10-CM | POA: Diagnosis not present

## 2016-08-08 ENCOUNTER — Ambulatory Visit: Payer: Commercial Managed Care - HMO

## 2016-08-11 ENCOUNTER — Ambulatory Visit: Payer: Self-pay

## 2016-10-18 DIAGNOSIS — R300 Dysuria: Secondary | ICD-10-CM | POA: Diagnosis not present

## 2016-10-18 DIAGNOSIS — R410 Disorientation, unspecified: Secondary | ICD-10-CM | POA: Diagnosis not present

## 2016-10-18 DIAGNOSIS — R399 Unspecified symptoms and signs involving the genitourinary system: Secondary | ICD-10-CM | POA: Diagnosis not present

## 2016-10-18 DIAGNOSIS — N390 Urinary tract infection, site not specified: Secondary | ICD-10-CM | POA: Diagnosis not present

## 2016-12-15 DIAGNOSIS — R300 Dysuria: Secondary | ICD-10-CM | POA: Diagnosis not present

## 2016-12-15 DIAGNOSIS — N390 Urinary tract infection, site not specified: Secondary | ICD-10-CM | POA: Diagnosis not present

## 2017-01-02 DIAGNOSIS — M0609 Rheumatoid arthritis without rheumatoid factor, multiple sites: Secondary | ICD-10-CM | POA: Diagnosis not present

## 2017-01-09 DIAGNOSIS — M810 Age-related osteoporosis without current pathological fracture: Secondary | ICD-10-CM | POA: Diagnosis not present

## 2017-01-09 DIAGNOSIS — G250 Essential tremor: Secondary | ICD-10-CM | POA: Diagnosis not present

## 2017-01-09 DIAGNOSIS — M79641 Pain in right hand: Secondary | ICD-10-CM | POA: Diagnosis not present

## 2017-01-09 DIAGNOSIS — M0609 Rheumatoid arthritis without rheumatoid factor, multiple sites: Secondary | ICD-10-CM | POA: Diagnosis not present

## 2017-01-13 DIAGNOSIS — B351 Tinea unguium: Secondary | ICD-10-CM | POA: Diagnosis not present

## 2017-01-13 DIAGNOSIS — M79674 Pain in right toe(s): Secondary | ICD-10-CM | POA: Diagnosis not present

## 2017-01-13 DIAGNOSIS — M79675 Pain in left toe(s): Secondary | ICD-10-CM | POA: Diagnosis not present

## 2017-02-09 ENCOUNTER — Other Ambulatory Visit: Payer: Self-pay | Admitting: Urology

## 2017-02-09 DIAGNOSIS — N952 Postmenopausal atrophic vaginitis: Secondary | ICD-10-CM

## 2017-02-09 NOTE — Telephone Encounter (Signed)
Pt pharmacy sent a refill request of estradiol. Pt has not been seen in a year. Refilled denied.

## 2017-02-14 ENCOUNTER — Other Ambulatory Visit: Payer: Self-pay | Admitting: Urology

## 2017-02-14 DIAGNOSIS — N952 Postmenopausal atrophic vaginitis: Secondary | ICD-10-CM

## 2017-02-21 DIAGNOSIS — N390 Urinary tract infection, site not specified: Secondary | ICD-10-CM | POA: Diagnosis not present

## 2017-02-21 DIAGNOSIS — R300 Dysuria: Secondary | ICD-10-CM | POA: Diagnosis not present

## 2017-04-10 DIAGNOSIS — I10 Essential (primary) hypertension: Secondary | ICD-10-CM | POA: Diagnosis not present

## 2017-04-10 DIAGNOSIS — L4050 Arthropathic psoriasis, unspecified: Secondary | ICD-10-CM | POA: Diagnosis not present

## 2017-04-10 DIAGNOSIS — M0609 Rheumatoid arthritis without rheumatoid factor, multiple sites: Secondary | ICD-10-CM | POA: Diagnosis not present

## 2017-05-02 DIAGNOSIS — Z79899 Other long term (current) drug therapy: Secondary | ICD-10-CM | POA: Diagnosis not present

## 2017-05-02 DIAGNOSIS — G250 Essential tremor: Secondary | ICD-10-CM | POA: Diagnosis not present

## 2017-05-02 DIAGNOSIS — M810 Age-related osteoporosis without current pathological fracture: Secondary | ICD-10-CM | POA: Diagnosis not present

## 2017-05-02 DIAGNOSIS — M0609 Rheumatoid arthritis without rheumatoid factor, multiple sites: Secondary | ICD-10-CM | POA: Diagnosis not present

## 2017-05-17 DIAGNOSIS — N390 Urinary tract infection, site not specified: Secondary | ICD-10-CM | POA: Diagnosis not present

## 2017-05-23 DIAGNOSIS — N390 Urinary tract infection, site not specified: Secondary | ICD-10-CM | POA: Diagnosis not present

## 2017-08-07 DIAGNOSIS — N390 Urinary tract infection, site not specified: Secondary | ICD-10-CM | POA: Diagnosis not present

## 2017-08-07 DIAGNOSIS — M0609 Rheumatoid arthritis without rheumatoid factor, multiple sites: Secondary | ICD-10-CM | POA: Diagnosis not present

## 2017-08-07 DIAGNOSIS — I499 Cardiac arrhythmia, unspecified: Secondary | ICD-10-CM | POA: Diagnosis not present

## 2017-08-07 DIAGNOSIS — Z79899 Other long term (current) drug therapy: Secondary | ICD-10-CM | POA: Diagnosis not present

## 2017-09-11 DIAGNOSIS — H02053 Trichiasis without entropian right eye, unspecified eyelid: Secondary | ICD-10-CM | POA: Diagnosis not present

## 2017-10-02 DIAGNOSIS — I10 Essential (primary) hypertension: Secondary | ICD-10-CM | POA: Diagnosis not present

## 2017-10-02 DIAGNOSIS — R54 Age-related physical debility: Secondary | ICD-10-CM | POA: Diagnosis not present

## 2017-10-02 DIAGNOSIS — F329 Major depressive disorder, single episode, unspecified: Secondary | ICD-10-CM | POA: Diagnosis not present

## 2017-10-02 DIAGNOSIS — M069 Rheumatoid arthritis, unspecified: Secondary | ICD-10-CM | POA: Diagnosis not present

## 2017-10-06 DIAGNOSIS — Z79899 Other long term (current) drug therapy: Secondary | ICD-10-CM | POA: Diagnosis not present

## 2017-10-17 DIAGNOSIS — Z79899 Other long term (current) drug therapy: Secondary | ICD-10-CM | POA: Diagnosis not present

## 2017-10-23 DIAGNOSIS — R627 Adult failure to thrive: Secondary | ICD-10-CM | POA: Diagnosis not present

## 2017-10-23 DIAGNOSIS — F329 Major depressive disorder, single episode, unspecified: Secondary | ICD-10-CM | POA: Diagnosis not present

## 2017-10-23 DIAGNOSIS — Z658 Other specified problems related to psychosocial circumstances: Secondary | ICD-10-CM | POA: Diagnosis not present

## 2017-10-24 DIAGNOSIS — Z79899 Other long term (current) drug therapy: Secondary | ICD-10-CM | POA: Diagnosis not present

## 2017-10-30 DIAGNOSIS — I480 Paroxysmal atrial fibrillation: Secondary | ICD-10-CM | POA: Diagnosis not present

## 2017-10-30 DIAGNOSIS — I10 Essential (primary) hypertension: Secondary | ICD-10-CM | POA: Diagnosis not present

## 2017-10-30 DIAGNOSIS — M069 Rheumatoid arthritis, unspecified: Secondary | ICD-10-CM | POA: Diagnosis not present

## 2017-10-30 DIAGNOSIS — R627 Adult failure to thrive: Secondary | ICD-10-CM | POA: Diagnosis not present

## 2017-10-31 DIAGNOSIS — I10 Essential (primary) hypertension: Secondary | ICD-10-CM | POA: Diagnosis not present

## 2017-11-01 DIAGNOSIS — I10 Essential (primary) hypertension: Secondary | ICD-10-CM | POA: Diagnosis not present

## 2017-11-02 DIAGNOSIS — I10 Essential (primary) hypertension: Secondary | ICD-10-CM | POA: Diagnosis not present

## 2017-11-03 DIAGNOSIS — I10 Essential (primary) hypertension: Secondary | ICD-10-CM | POA: Diagnosis not present

## 2017-11-04 DIAGNOSIS — I10 Essential (primary) hypertension: Secondary | ICD-10-CM | POA: Diagnosis not present

## 2017-11-05 DIAGNOSIS — I10 Essential (primary) hypertension: Secondary | ICD-10-CM | POA: Diagnosis not present

## 2017-11-06 DIAGNOSIS — I10 Essential (primary) hypertension: Secondary | ICD-10-CM | POA: Diagnosis not present

## 2017-11-06 DIAGNOSIS — G250 Essential tremor: Secondary | ICD-10-CM | POA: Diagnosis not present

## 2017-11-06 DIAGNOSIS — M810 Age-related osteoporosis without current pathological fracture: Secondary | ICD-10-CM | POA: Diagnosis not present

## 2017-11-06 DIAGNOSIS — M0579 Rheumatoid arthritis with rheumatoid factor of multiple sites without organ or systems involvement: Secondary | ICD-10-CM | POA: Diagnosis not present

## 2017-11-07 DIAGNOSIS — I10 Essential (primary) hypertension: Secondary | ICD-10-CM | POA: Diagnosis not present

## 2017-11-08 DIAGNOSIS — I10 Essential (primary) hypertension: Secondary | ICD-10-CM | POA: Diagnosis not present

## 2017-11-09 DIAGNOSIS — I10 Essential (primary) hypertension: Secondary | ICD-10-CM | POA: Diagnosis not present

## 2017-11-10 DIAGNOSIS — I10 Essential (primary) hypertension: Secondary | ICD-10-CM | POA: Diagnosis not present

## 2017-11-11 DIAGNOSIS — I10 Essential (primary) hypertension: Secondary | ICD-10-CM | POA: Diagnosis not present

## 2017-11-12 DIAGNOSIS — I10 Essential (primary) hypertension: Secondary | ICD-10-CM | POA: Diagnosis not present

## 2017-11-13 DIAGNOSIS — I10 Essential (primary) hypertension: Secondary | ICD-10-CM | POA: Diagnosis not present

## 2017-11-14 DIAGNOSIS — I10 Essential (primary) hypertension: Secondary | ICD-10-CM | POA: Diagnosis not present

## 2017-11-15 DIAGNOSIS — I10 Essential (primary) hypertension: Secondary | ICD-10-CM | POA: Diagnosis not present

## 2017-11-16 DIAGNOSIS — I10 Essential (primary) hypertension: Secondary | ICD-10-CM | POA: Diagnosis not present

## 2017-11-17 DIAGNOSIS — I10 Essential (primary) hypertension: Secondary | ICD-10-CM | POA: Diagnosis not present

## 2017-11-18 DIAGNOSIS — I10 Essential (primary) hypertension: Secondary | ICD-10-CM | POA: Diagnosis not present

## 2017-11-19 DIAGNOSIS — I10 Essential (primary) hypertension: Secondary | ICD-10-CM | POA: Diagnosis not present

## 2017-11-20 DIAGNOSIS — I10 Essential (primary) hypertension: Secondary | ICD-10-CM | POA: Diagnosis not present

## 2017-11-21 DIAGNOSIS — I10 Essential (primary) hypertension: Secondary | ICD-10-CM | POA: Diagnosis not present

## 2017-11-22 DIAGNOSIS — I10 Essential (primary) hypertension: Secondary | ICD-10-CM | POA: Diagnosis not present

## 2017-11-23 DIAGNOSIS — I10 Essential (primary) hypertension: Secondary | ICD-10-CM | POA: Diagnosis not present

## 2017-11-24 DIAGNOSIS — I10 Essential (primary) hypertension: Secondary | ICD-10-CM | POA: Diagnosis not present

## 2017-11-25 DIAGNOSIS — I10 Essential (primary) hypertension: Secondary | ICD-10-CM | POA: Diagnosis not present

## 2017-11-26 DIAGNOSIS — I10 Essential (primary) hypertension: Secondary | ICD-10-CM | POA: Diagnosis not present

## 2017-11-27 DIAGNOSIS — I10 Essential (primary) hypertension: Secondary | ICD-10-CM | POA: Diagnosis not present

## 2017-11-28 DIAGNOSIS — Z79899 Other long term (current) drug therapy: Secondary | ICD-10-CM | POA: Diagnosis not present

## 2017-11-28 DIAGNOSIS — I10 Essential (primary) hypertension: Secondary | ICD-10-CM | POA: Diagnosis not present

## 2017-11-29 DIAGNOSIS — I10 Essential (primary) hypertension: Secondary | ICD-10-CM | POA: Diagnosis not present

## 2017-11-30 DIAGNOSIS — I10 Essential (primary) hypertension: Secondary | ICD-10-CM | POA: Diagnosis not present

## 2017-12-01 DIAGNOSIS — I480 Paroxysmal atrial fibrillation: Secondary | ICD-10-CM | POA: Diagnosis not present

## 2017-12-01 DIAGNOSIS — F329 Major depressive disorder, single episode, unspecified: Secondary | ICD-10-CM | POA: Diagnosis not present

## 2017-12-01 DIAGNOSIS — M069 Rheumatoid arthritis, unspecified: Secondary | ICD-10-CM | POA: Diagnosis not present

## 2017-12-01 DIAGNOSIS — I10 Essential (primary) hypertension: Secondary | ICD-10-CM | POA: Diagnosis not present

## 2017-12-02 DIAGNOSIS — I10 Essential (primary) hypertension: Secondary | ICD-10-CM | POA: Diagnosis not present

## 2017-12-03 DIAGNOSIS — I10 Essential (primary) hypertension: Secondary | ICD-10-CM | POA: Diagnosis not present

## 2017-12-04 DIAGNOSIS — I10 Essential (primary) hypertension: Secondary | ICD-10-CM | POA: Diagnosis not present

## 2017-12-05 DIAGNOSIS — R627 Adult failure to thrive: Secondary | ICD-10-CM | POA: Diagnosis not present

## 2017-12-05 DIAGNOSIS — I10 Essential (primary) hypertension: Secondary | ICD-10-CM | POA: Diagnosis not present

## 2017-12-05 DIAGNOSIS — M069 Rheumatoid arthritis, unspecified: Secondary | ICD-10-CM | POA: Diagnosis not present

## 2017-12-05 DIAGNOSIS — F329 Major depressive disorder, single episode, unspecified: Secondary | ICD-10-CM | POA: Diagnosis not present

## 2017-12-05 DIAGNOSIS — Z0000 Encounter for general adult medical examination without abnormal findings: Secondary | ICD-10-CM | POA: Diagnosis not present

## 2017-12-05 DIAGNOSIS — I480 Paroxysmal atrial fibrillation: Secondary | ICD-10-CM | POA: Diagnosis not present

## 2017-12-06 DIAGNOSIS — I10 Essential (primary) hypertension: Secondary | ICD-10-CM | POA: Diagnosis not present

## 2017-12-07 DIAGNOSIS — I10 Essential (primary) hypertension: Secondary | ICD-10-CM | POA: Diagnosis not present

## 2017-12-08 DIAGNOSIS — I10 Essential (primary) hypertension: Secondary | ICD-10-CM | POA: Diagnosis not present

## 2017-12-09 DIAGNOSIS — I10 Essential (primary) hypertension: Secondary | ICD-10-CM | POA: Diagnosis not present

## 2017-12-10 DIAGNOSIS — I10 Essential (primary) hypertension: Secondary | ICD-10-CM | POA: Diagnosis not present

## 2017-12-11 DIAGNOSIS — I10 Essential (primary) hypertension: Secondary | ICD-10-CM | POA: Diagnosis not present

## 2017-12-12 DIAGNOSIS — I10 Essential (primary) hypertension: Secondary | ICD-10-CM | POA: Diagnosis not present

## 2017-12-13 DIAGNOSIS — I10 Essential (primary) hypertension: Secondary | ICD-10-CM | POA: Diagnosis not present

## 2017-12-14 DIAGNOSIS — I10 Essential (primary) hypertension: Secondary | ICD-10-CM | POA: Diagnosis not present

## 2017-12-15 DIAGNOSIS — I10 Essential (primary) hypertension: Secondary | ICD-10-CM | POA: Diagnosis not present

## 2017-12-16 DIAGNOSIS — I10 Essential (primary) hypertension: Secondary | ICD-10-CM | POA: Diagnosis not present

## 2017-12-17 DIAGNOSIS — I10 Essential (primary) hypertension: Secondary | ICD-10-CM | POA: Diagnosis not present

## 2017-12-18 DIAGNOSIS — I10 Essential (primary) hypertension: Secondary | ICD-10-CM | POA: Diagnosis not present

## 2017-12-19 DIAGNOSIS — I10 Essential (primary) hypertension: Secondary | ICD-10-CM | POA: Diagnosis not present

## 2017-12-20 DIAGNOSIS — I10 Essential (primary) hypertension: Secondary | ICD-10-CM | POA: Diagnosis not present

## 2017-12-21 DIAGNOSIS — I10 Essential (primary) hypertension: Secondary | ICD-10-CM | POA: Diagnosis not present

## 2017-12-22 DIAGNOSIS — I10 Essential (primary) hypertension: Secondary | ICD-10-CM | POA: Diagnosis not present

## 2017-12-23 DIAGNOSIS — I10 Essential (primary) hypertension: Secondary | ICD-10-CM | POA: Diagnosis not present

## 2017-12-24 DIAGNOSIS — I10 Essential (primary) hypertension: Secondary | ICD-10-CM | POA: Diagnosis not present

## 2017-12-25 DIAGNOSIS — I10 Essential (primary) hypertension: Secondary | ICD-10-CM | POA: Diagnosis not present

## 2017-12-26 DIAGNOSIS — I10 Essential (primary) hypertension: Secondary | ICD-10-CM | POA: Diagnosis not present

## 2017-12-27 DIAGNOSIS — I10 Essential (primary) hypertension: Secondary | ICD-10-CM | POA: Diagnosis not present

## 2017-12-28 DIAGNOSIS — I10 Essential (primary) hypertension: Secondary | ICD-10-CM | POA: Diagnosis not present

## 2017-12-29 DIAGNOSIS — I10 Essential (primary) hypertension: Secondary | ICD-10-CM | POA: Diagnosis not present

## 2017-12-30 DIAGNOSIS — I10 Essential (primary) hypertension: Secondary | ICD-10-CM | POA: Diagnosis not present

## 2017-12-31 DIAGNOSIS — I10 Essential (primary) hypertension: Secondary | ICD-10-CM | POA: Diagnosis not present

## 2018-01-01 DIAGNOSIS — I10 Essential (primary) hypertension: Secondary | ICD-10-CM | POA: Diagnosis not present

## 2018-01-02 DIAGNOSIS — M069 Rheumatoid arthritis, unspecified: Secondary | ICD-10-CM | POA: Diagnosis not present

## 2018-01-02 DIAGNOSIS — I480 Paroxysmal atrial fibrillation: Secondary | ICD-10-CM | POA: Diagnosis not present

## 2018-01-02 DIAGNOSIS — I10 Essential (primary) hypertension: Secondary | ICD-10-CM | POA: Diagnosis not present

## 2018-01-02 DIAGNOSIS — F329 Major depressive disorder, single episode, unspecified: Secondary | ICD-10-CM | POA: Diagnosis not present

## 2018-01-03 DIAGNOSIS — I10 Essential (primary) hypertension: Secondary | ICD-10-CM | POA: Diagnosis not present

## 2018-01-04 DIAGNOSIS — I10 Essential (primary) hypertension: Secondary | ICD-10-CM | POA: Diagnosis not present

## 2018-01-05 DIAGNOSIS — I10 Essential (primary) hypertension: Secondary | ICD-10-CM | POA: Diagnosis not present

## 2018-01-06 DIAGNOSIS — I10 Essential (primary) hypertension: Secondary | ICD-10-CM | POA: Diagnosis not present

## 2018-01-07 DIAGNOSIS — I10 Essential (primary) hypertension: Secondary | ICD-10-CM | POA: Diagnosis not present

## 2018-01-08 DIAGNOSIS — I10 Essential (primary) hypertension: Secondary | ICD-10-CM | POA: Diagnosis not present

## 2018-01-09 DIAGNOSIS — I10 Essential (primary) hypertension: Secondary | ICD-10-CM | POA: Diagnosis not present

## 2018-01-10 DIAGNOSIS — I10 Essential (primary) hypertension: Secondary | ICD-10-CM | POA: Diagnosis not present

## 2018-01-11 DIAGNOSIS — I10 Essential (primary) hypertension: Secondary | ICD-10-CM | POA: Diagnosis not present

## 2018-01-12 DIAGNOSIS — I10 Essential (primary) hypertension: Secondary | ICD-10-CM | POA: Diagnosis not present

## 2018-01-13 DIAGNOSIS — I10 Essential (primary) hypertension: Secondary | ICD-10-CM | POA: Diagnosis not present

## 2018-01-14 DIAGNOSIS — I10 Essential (primary) hypertension: Secondary | ICD-10-CM | POA: Diagnosis not present

## 2018-01-15 DIAGNOSIS — I10 Essential (primary) hypertension: Secondary | ICD-10-CM | POA: Diagnosis not present

## 2018-01-16 DIAGNOSIS — I10 Essential (primary) hypertension: Secondary | ICD-10-CM | POA: Diagnosis not present

## 2018-01-17 DIAGNOSIS — I10 Essential (primary) hypertension: Secondary | ICD-10-CM | POA: Diagnosis not present

## 2018-01-18 DIAGNOSIS — I10 Essential (primary) hypertension: Secondary | ICD-10-CM | POA: Diagnosis not present

## 2018-01-19 DIAGNOSIS — I10 Essential (primary) hypertension: Secondary | ICD-10-CM | POA: Diagnosis not present

## 2018-01-20 DIAGNOSIS — I10 Essential (primary) hypertension: Secondary | ICD-10-CM | POA: Diagnosis not present

## 2018-01-21 DIAGNOSIS — I10 Essential (primary) hypertension: Secondary | ICD-10-CM | POA: Diagnosis not present

## 2018-01-22 DIAGNOSIS — I10 Essential (primary) hypertension: Secondary | ICD-10-CM | POA: Diagnosis not present

## 2018-01-23 DIAGNOSIS — I10 Essential (primary) hypertension: Secondary | ICD-10-CM | POA: Diagnosis not present

## 2018-01-24 DIAGNOSIS — I10 Essential (primary) hypertension: Secondary | ICD-10-CM | POA: Diagnosis not present

## 2018-01-25 DIAGNOSIS — I10 Essential (primary) hypertension: Secondary | ICD-10-CM | POA: Diagnosis not present

## 2018-01-26 DIAGNOSIS — I10 Essential (primary) hypertension: Secondary | ICD-10-CM | POA: Diagnosis not present

## 2018-01-27 DIAGNOSIS — I10 Essential (primary) hypertension: Secondary | ICD-10-CM | POA: Diagnosis not present

## 2018-01-28 DIAGNOSIS — I10 Essential (primary) hypertension: Secondary | ICD-10-CM | POA: Diagnosis not present

## 2018-01-29 DIAGNOSIS — I10 Essential (primary) hypertension: Secondary | ICD-10-CM | POA: Diagnosis not present

## 2018-01-30 DIAGNOSIS — M069 Rheumatoid arthritis, unspecified: Secondary | ICD-10-CM | POA: Diagnosis not present

## 2018-01-30 DIAGNOSIS — I480 Paroxysmal atrial fibrillation: Secondary | ICD-10-CM | POA: Diagnosis not present

## 2018-01-30 DIAGNOSIS — I10 Essential (primary) hypertension: Secondary | ICD-10-CM | POA: Diagnosis not present

## 2018-01-30 DIAGNOSIS — R4181 Age-related cognitive decline: Secondary | ICD-10-CM | POA: Diagnosis not present

## 2018-01-30 DIAGNOSIS — B351 Tinea unguium: Secondary | ICD-10-CM | POA: Diagnosis not present

## 2018-01-30 DIAGNOSIS — I739 Peripheral vascular disease, unspecified: Secondary | ICD-10-CM | POA: Diagnosis not present

## 2018-01-30 DIAGNOSIS — M24572 Contracture, left ankle: Secondary | ICD-10-CM | POA: Diagnosis not present

## 2018-01-30 DIAGNOSIS — L84 Corns and callosities: Secondary | ICD-10-CM | POA: Diagnosis not present

## 2018-01-31 DIAGNOSIS — I10 Essential (primary) hypertension: Secondary | ICD-10-CM | POA: Diagnosis not present

## 2018-02-01 DIAGNOSIS — I10 Essential (primary) hypertension: Secondary | ICD-10-CM | POA: Diagnosis not present

## 2018-02-02 DIAGNOSIS — R300 Dysuria: Secondary | ICD-10-CM | POA: Diagnosis not present

## 2018-02-02 DIAGNOSIS — I10 Essential (primary) hypertension: Secondary | ICD-10-CM | POA: Diagnosis not present

## 2018-02-03 DIAGNOSIS — I10 Essential (primary) hypertension: Secondary | ICD-10-CM | POA: Diagnosis not present

## 2018-02-04 DIAGNOSIS — I10 Essential (primary) hypertension: Secondary | ICD-10-CM | POA: Diagnosis not present

## 2018-02-05 DIAGNOSIS — I10 Essential (primary) hypertension: Secondary | ICD-10-CM | POA: Diagnosis not present

## 2018-02-06 DIAGNOSIS — I10 Essential (primary) hypertension: Secondary | ICD-10-CM | POA: Diagnosis not present

## 2018-02-07 DIAGNOSIS — I10 Essential (primary) hypertension: Secondary | ICD-10-CM | POA: Diagnosis not present

## 2018-02-08 DIAGNOSIS — I10 Essential (primary) hypertension: Secondary | ICD-10-CM | POA: Diagnosis not present

## 2018-02-09 DIAGNOSIS — I10 Essential (primary) hypertension: Secondary | ICD-10-CM | POA: Diagnosis not present

## 2018-02-10 DIAGNOSIS — I10 Essential (primary) hypertension: Secondary | ICD-10-CM | POA: Diagnosis not present

## 2018-02-11 DIAGNOSIS — I10 Essential (primary) hypertension: Secondary | ICD-10-CM | POA: Diagnosis not present

## 2018-02-12 DIAGNOSIS — I10 Essential (primary) hypertension: Secondary | ICD-10-CM | POA: Diagnosis not present

## 2018-02-13 DIAGNOSIS — I10 Essential (primary) hypertension: Secondary | ICD-10-CM | POA: Diagnosis not present

## 2018-02-14 DIAGNOSIS — I10 Essential (primary) hypertension: Secondary | ICD-10-CM | POA: Diagnosis not present

## 2018-02-15 DIAGNOSIS — I10 Essential (primary) hypertension: Secondary | ICD-10-CM | POA: Diagnosis not present

## 2018-02-16 DIAGNOSIS — I10 Essential (primary) hypertension: Secondary | ICD-10-CM | POA: Diagnosis not present

## 2018-02-17 DIAGNOSIS — I10 Essential (primary) hypertension: Secondary | ICD-10-CM | POA: Diagnosis not present

## 2018-02-18 DIAGNOSIS — I10 Essential (primary) hypertension: Secondary | ICD-10-CM | POA: Diagnosis not present

## 2018-02-19 DIAGNOSIS — I10 Essential (primary) hypertension: Secondary | ICD-10-CM | POA: Diagnosis not present

## 2018-02-20 DIAGNOSIS — I10 Essential (primary) hypertension: Secondary | ICD-10-CM | POA: Diagnosis not present

## 2018-02-21 DIAGNOSIS — I10 Essential (primary) hypertension: Secondary | ICD-10-CM | POA: Diagnosis not present

## 2018-02-22 DIAGNOSIS — I10 Essential (primary) hypertension: Secondary | ICD-10-CM | POA: Diagnosis not present

## 2018-02-23 DIAGNOSIS — I10 Essential (primary) hypertension: Secondary | ICD-10-CM | POA: Diagnosis not present

## 2018-02-24 DIAGNOSIS — I10 Essential (primary) hypertension: Secondary | ICD-10-CM | POA: Diagnosis not present

## 2018-02-25 DIAGNOSIS — I10 Essential (primary) hypertension: Secondary | ICD-10-CM | POA: Diagnosis not present

## 2018-02-26 DIAGNOSIS — I10 Essential (primary) hypertension: Secondary | ICD-10-CM | POA: Diagnosis not present

## 2018-02-27 DIAGNOSIS — I10 Essential (primary) hypertension: Secondary | ICD-10-CM | POA: Diagnosis not present

## 2018-02-28 DIAGNOSIS — I10 Essential (primary) hypertension: Secondary | ICD-10-CM | POA: Diagnosis not present

## 2018-03-01 DIAGNOSIS — I10 Essential (primary) hypertension: Secondary | ICD-10-CM | POA: Diagnosis not present

## 2018-03-02 DIAGNOSIS — I10 Essential (primary) hypertension: Secondary | ICD-10-CM | POA: Diagnosis not present

## 2018-03-03 DIAGNOSIS — I10 Essential (primary) hypertension: Secondary | ICD-10-CM | POA: Diagnosis not present

## 2018-03-04 DIAGNOSIS — I10 Essential (primary) hypertension: Secondary | ICD-10-CM | POA: Diagnosis not present

## 2018-03-05 DIAGNOSIS — I10 Essential (primary) hypertension: Secondary | ICD-10-CM | POA: Diagnosis not present

## 2018-03-06 DIAGNOSIS — I10 Essential (primary) hypertension: Secondary | ICD-10-CM | POA: Diagnosis not present

## 2018-03-07 DIAGNOSIS — I10 Essential (primary) hypertension: Secondary | ICD-10-CM | POA: Diagnosis not present

## 2018-03-08 DIAGNOSIS — I10 Essential (primary) hypertension: Secondary | ICD-10-CM | POA: Diagnosis not present

## 2018-03-09 DIAGNOSIS — I10 Essential (primary) hypertension: Secondary | ICD-10-CM | POA: Diagnosis not present

## 2018-03-10 DIAGNOSIS — I10 Essential (primary) hypertension: Secondary | ICD-10-CM | POA: Diagnosis not present

## 2018-03-11 DIAGNOSIS — I10 Essential (primary) hypertension: Secondary | ICD-10-CM | POA: Diagnosis not present

## 2018-03-12 DIAGNOSIS — I10 Essential (primary) hypertension: Secondary | ICD-10-CM | POA: Diagnosis not present

## 2018-03-13 DIAGNOSIS — I10 Essential (primary) hypertension: Secondary | ICD-10-CM | POA: Diagnosis not present

## 2018-03-14 DIAGNOSIS — D518 Other vitamin B12 deficiency anemias: Secondary | ICD-10-CM | POA: Diagnosis not present

## 2018-03-14 DIAGNOSIS — E559 Vitamin D deficiency, unspecified: Secondary | ICD-10-CM | POA: Diagnosis not present

## 2018-03-14 DIAGNOSIS — I10 Essential (primary) hypertension: Secondary | ICD-10-CM | POA: Diagnosis not present

## 2018-03-14 DIAGNOSIS — E7849 Other hyperlipidemia: Secondary | ICD-10-CM | POA: Diagnosis not present

## 2018-03-14 DIAGNOSIS — E038 Other specified hypothyroidism: Secondary | ICD-10-CM | POA: Diagnosis not present

## 2018-03-14 DIAGNOSIS — Z79899 Other long term (current) drug therapy: Secondary | ICD-10-CM | POA: Diagnosis not present

## 2018-03-14 DIAGNOSIS — E119 Type 2 diabetes mellitus without complications: Secondary | ICD-10-CM | POA: Diagnosis not present

## 2018-03-15 DIAGNOSIS — I10 Essential (primary) hypertension: Secondary | ICD-10-CM | POA: Diagnosis not present

## 2018-03-16 DIAGNOSIS — I10 Essential (primary) hypertension: Secondary | ICD-10-CM | POA: Diagnosis not present

## 2018-03-17 DIAGNOSIS — I10 Essential (primary) hypertension: Secondary | ICD-10-CM | POA: Diagnosis not present

## 2018-03-18 DIAGNOSIS — I10 Essential (primary) hypertension: Secondary | ICD-10-CM | POA: Diagnosis not present

## 2018-03-19 DIAGNOSIS — I10 Essential (primary) hypertension: Secondary | ICD-10-CM | POA: Diagnosis not present

## 2018-03-20 DIAGNOSIS — I10 Essential (primary) hypertension: Secondary | ICD-10-CM | POA: Diagnosis not present

## 2018-03-21 DIAGNOSIS — I10 Essential (primary) hypertension: Secondary | ICD-10-CM | POA: Diagnosis not present

## 2018-03-22 DIAGNOSIS — I10 Essential (primary) hypertension: Secondary | ICD-10-CM | POA: Diagnosis not present

## 2018-03-23 DIAGNOSIS — I10 Essential (primary) hypertension: Secondary | ICD-10-CM | POA: Diagnosis not present

## 2018-03-24 DIAGNOSIS — I10 Essential (primary) hypertension: Secondary | ICD-10-CM | POA: Diagnosis not present

## 2018-03-25 DIAGNOSIS — I10 Essential (primary) hypertension: Secondary | ICD-10-CM | POA: Diagnosis not present

## 2018-03-26 DIAGNOSIS — I10 Essential (primary) hypertension: Secondary | ICD-10-CM | POA: Diagnosis not present

## 2018-03-27 DIAGNOSIS — I10 Essential (primary) hypertension: Secondary | ICD-10-CM | POA: Diagnosis not present

## 2018-03-28 DIAGNOSIS — I10 Essential (primary) hypertension: Secondary | ICD-10-CM | POA: Diagnosis not present

## 2018-03-29 DIAGNOSIS — I10 Essential (primary) hypertension: Secondary | ICD-10-CM | POA: Diagnosis not present

## 2018-03-30 DIAGNOSIS — I10 Essential (primary) hypertension: Secondary | ICD-10-CM | POA: Diagnosis not present

## 2018-03-31 DIAGNOSIS — I10 Essential (primary) hypertension: Secondary | ICD-10-CM | POA: Diagnosis not present

## 2018-04-01 DIAGNOSIS — I10 Essential (primary) hypertension: Secondary | ICD-10-CM | POA: Diagnosis not present

## 2018-04-02 DIAGNOSIS — I10 Essential (primary) hypertension: Secondary | ICD-10-CM | POA: Diagnosis not present

## 2018-04-03 DIAGNOSIS — I10 Essential (primary) hypertension: Secondary | ICD-10-CM | POA: Diagnosis not present

## 2018-04-04 DIAGNOSIS — I10 Essential (primary) hypertension: Secondary | ICD-10-CM | POA: Diagnosis not present

## 2018-04-05 DIAGNOSIS — I10 Essential (primary) hypertension: Secondary | ICD-10-CM | POA: Diagnosis not present

## 2018-04-06 DIAGNOSIS — I10 Essential (primary) hypertension: Secondary | ICD-10-CM | POA: Diagnosis not present

## 2018-04-07 DIAGNOSIS — I10 Essential (primary) hypertension: Secondary | ICD-10-CM | POA: Diagnosis not present

## 2018-04-08 DIAGNOSIS — I10 Essential (primary) hypertension: Secondary | ICD-10-CM | POA: Diagnosis not present

## 2018-04-09 DIAGNOSIS — I10 Essential (primary) hypertension: Secondary | ICD-10-CM | POA: Diagnosis not present

## 2018-04-10 DIAGNOSIS — I10 Essential (primary) hypertension: Secondary | ICD-10-CM | POA: Diagnosis not present

## 2018-04-11 DIAGNOSIS — I10 Essential (primary) hypertension: Secondary | ICD-10-CM | POA: Diagnosis not present

## 2018-04-12 DIAGNOSIS — I10 Essential (primary) hypertension: Secondary | ICD-10-CM | POA: Diagnosis not present

## 2018-04-13 DIAGNOSIS — I10 Essential (primary) hypertension: Secondary | ICD-10-CM | POA: Diagnosis not present

## 2018-04-14 DIAGNOSIS — I10 Essential (primary) hypertension: Secondary | ICD-10-CM | POA: Diagnosis not present

## 2018-04-15 DIAGNOSIS — I10 Essential (primary) hypertension: Secondary | ICD-10-CM | POA: Diagnosis not present

## 2018-04-16 DIAGNOSIS — I10 Essential (primary) hypertension: Secondary | ICD-10-CM | POA: Diagnosis not present

## 2018-04-17 DIAGNOSIS — I10 Essential (primary) hypertension: Secondary | ICD-10-CM | POA: Diagnosis not present

## 2018-04-18 DIAGNOSIS — I10 Essential (primary) hypertension: Secondary | ICD-10-CM | POA: Diagnosis not present

## 2018-04-19 DIAGNOSIS — I10 Essential (primary) hypertension: Secondary | ICD-10-CM | POA: Diagnosis not present

## 2018-04-20 DIAGNOSIS — I10 Essential (primary) hypertension: Secondary | ICD-10-CM | POA: Diagnosis not present

## 2018-04-21 DIAGNOSIS — I10 Essential (primary) hypertension: Secondary | ICD-10-CM | POA: Diagnosis not present

## 2018-04-22 DIAGNOSIS — I10 Essential (primary) hypertension: Secondary | ICD-10-CM | POA: Diagnosis not present

## 2018-04-23 DIAGNOSIS — I10 Essential (primary) hypertension: Secondary | ICD-10-CM | POA: Diagnosis not present

## 2018-04-24 ENCOUNTER — Emergency Department: Payer: Medicare HMO

## 2018-04-24 ENCOUNTER — Emergency Department
Admission: EM | Admit: 2018-04-24 | Discharge: 2018-04-24 | Disposition: A | Discharge status: Home / Self Care | Payer: Medicare HMO | Attending: Emergency Medicine | Admitting: Emergency Medicine

## 2018-04-24 ENCOUNTER — Other Ambulatory Visit: Payer: Self-pay

## 2018-04-24 DIAGNOSIS — I10 Essential (primary) hypertension: Secondary | ICD-10-CM | POA: Diagnosis not present

## 2018-04-24 DIAGNOSIS — R112 Nausea with vomiting, unspecified: Secondary | ICD-10-CM | POA: Insufficient documentation

## 2018-04-24 DIAGNOSIS — Z87891 Personal history of nicotine dependence: Secondary | ICD-10-CM | POA: Insufficient documentation

## 2018-04-24 DIAGNOSIS — N390 Urinary tract infection, site not specified: Secondary | ICD-10-CM

## 2018-04-24 DIAGNOSIS — Z8679 Personal history of other diseases of the circulatory system: Secondary | ICD-10-CM | POA: Diagnosis not present

## 2018-04-24 DIAGNOSIS — R109 Unspecified abdominal pain: Secondary | ICD-10-CM | POA: Diagnosis not present

## 2018-04-24 DIAGNOSIS — Z7982 Long term (current) use of aspirin: Secondary | ICD-10-CM | POA: Insufficient documentation

## 2018-04-24 DIAGNOSIS — F0390 Unspecified dementia without behavioral disturbance: Secondary | ICD-10-CM | POA: Insufficient documentation

## 2018-04-24 DIAGNOSIS — J449 Chronic obstructive pulmonary disease, unspecified: Secondary | ICD-10-CM | POA: Diagnosis not present

## 2018-04-24 DIAGNOSIS — R42 Dizziness and giddiness: Secondary | ICD-10-CM | POA: Diagnosis not present

## 2018-04-24 DIAGNOSIS — Z79899 Other long term (current) drug therapy: Secondary | ICD-10-CM | POA: Diagnosis not present

## 2018-04-24 LAB — URINALYSIS, COMPLETE (UACMP) WITH MICROSCOPIC
Bilirubin Urine: NEGATIVE
Glucose, UA: NEGATIVE mg/dL
Ketones, ur: 5 mg/dL — AB
NITRITE: NEGATIVE
PH: 8 (ref 5.0–8.0)
Protein, ur: NEGATIVE mg/dL
SPECIFIC GRAVITY, URINE: 1.01 (ref 1.005–1.030)
SQUAMOUS EPITHELIAL / LPF: NONE SEEN (ref 0–5)

## 2018-04-24 LAB — BASIC METABOLIC PANEL
Anion gap: 7 (ref 5–15)
BUN: 13 mg/dL (ref 6–20)
CALCIUM: 9.1 mg/dL (ref 8.9–10.3)
CO2: 30 mmol/L (ref 22–32)
CREATININE: 0.68 mg/dL (ref 0.44–1.00)
Chloride: 98 mmol/L — ABNORMAL LOW (ref 101–111)
GFR calc Af Amer: >60 mL/min (ref >60)
GLUCOSE: 122 mg/dL — AB (ref 65–99)
Potassium: 3.5 mmol/L (ref 3.5–5.1)
SODIUM: 135 mmol/L (ref 135–145)

## 2018-04-24 LAB — CBC
HCT: 39.1 % (ref 35.0–47.0)
Hemoglobin: 13.2 g/dL (ref 12.0–16.0)
MCH: 32.1 pg (ref 26.0–34.0)
MCHC: 33.7 g/dL (ref 32.0–36.0)
MCV: 95 fL (ref 80.0–100.0)
PLATELETS: 189 10*3/uL (ref 150–440)
RBC: 4.11 MIL/uL (ref 3.80–5.20)
RDW: 15.4 % — AB (ref 11.5–14.5)
WBC: 8.5 10*3/uL (ref 3.6–11.0)

## 2018-04-24 MED ORDER — ONDANSETRON 4 MG PO TBDP: 4.0000 mg | ORAL_TABLET | Freq: Three times a day (TID) | ORAL | 0 refills | Status: DC | PRN

## 2018-04-24 MED ORDER — SODIUM CHLORIDE 0.9 % IV SOLN
1.0000 g | Freq: Once | INTRAVENOUS | Status: AC
  Administered 2018-04-24: 1 g via INTRAVENOUS
  Filled 2018-04-24: qty 10

## 2018-04-24 MED ORDER — IOPAMIDOL (ISOVUE-370) INJECTION 76%
75.0000 mL | Freq: Once | INTRAVENOUS | Status: AC | PRN
  Administered 2018-04-24: 75 mL via INTRAVENOUS

## 2018-04-24 MED ORDER — SODIUM CHLORIDE 0.9 % IV BOLUS
1000.0000 mL | Freq: Once | INTRAVENOUS | Status: AC
  Administered 2018-04-24: 1000 mL via INTRAVENOUS

## 2018-04-24 MED ORDER — ONDANSETRON HCL 4 MG/2ML IJ SOLN
4.0000 mg | Freq: Once | INTRAMUSCULAR | Status: AC
  Administered 2018-04-24: 4 mg via INTRAVENOUS
  Filled 2018-04-24: qty 2

## 2018-04-24 MED ORDER — CEFPODOXIME PROXETIL 100 MG PO TABS: 100.0000 mg | ORAL_TABLET | Freq: Two times a day (BID) | ORAL | 0 refills | Status: DC

## 2018-04-24 NOTE — ED Notes (Signed)
Report obtained from [place], [place]. Pt on BSC upon this RN arrival to room. Pt states she woke up this am turned in bed, and felt dizzy.  Soft bm at bsc, urine obtained. Pt states she has to pee some and then when she thinks she's done, there is more. Assisted to bed with one assist. Pt able to tell me she is from assisted living, however did ask me why she was here multiple times.  Pt reoriented easily.  When back in bed, pt began vomiting into bag, small amount of vomitus noted. Pt denies pain at this time.

## 2018-04-24 NOTE — ED Triage Notes (Signed)
See first nurse note. When asked pt why she is here, pt states "I dont know".. Pt is orient to self only

## 2018-04-24 NOTE — ED Provider Notes (Signed)
[facility] Emergency Department Provider Note  ____________________________________________  Time seen: Approximately 1:26 PM  I have reviewed the triage vital signs and the nursing notes.   HISTORY  Chief Complaint Dizziness  Level 5 Caveat: Portions of the History and Physical are unable to be obtained due to patient being a poor historian   HPI Victoria Esparza is a 84 y.o. female with a history of atrial fibrillation, COPD, dementia is brought to the ED due to nausea and vomiting that started this morning. Patient denies any pain but does have a persistent feeling like she needs to urinate.     Past Medical History:  Diagnosis Date  . A-fib ([place])   . Arthritis   . Cognitive dysfunction   . Colon adenoma   . COPD (chronic obstructive pulmonary disease) ([place])   . Depression   . Familial tremor   . HLD (hyperlipidemia)   . Hypertension   . Osteoarthritis   . Osteoporosis   . Ovarian cyst   . Psoriatic arthropathy ([place])   . Rotator cuff syndrome   . Sigmoid diverticulitis      Patient Active Problem List   Diagnosis Date Noted  . Psoriasis with arthropathy ([place]) 01/05/2016  . Benign essential HTN 09/08/2015  . Paroxysmal atrial fibrillation ([place]) 08/31/2015  . OP (osteoporosis) 04/20/2015     Past Surgical History:  Procedure Laterality Date  . ABDOMINAL HYSTERECTOMY    . COLONOSCOPY       Prior to Admission medications   Medication Sig Start Date End Date Taking? Authorizing Provider  alendronate (FOSAMAX) 70 MG tablet Take 70 mg by mouth once a week. Reported on 01/22/2016    [provider]  aspirin 325 MG EC tablet Take 325 mg by mouth daily.    [provider]  calcium-vitamin D (SM CALCIUM 500/VITAMIN D3) 500-400 MG-UNIT tablet Take by mouth.    [provider]  cefpodoxime (VANTIN) 100 MG tablet Take 1 tablet (100 mg total) by mouth 2 (two) times daily. 04/24/18   Carrie Mew, MD   Cyanocobalamin (RA VITAMIN B-12 TR) 1000 MCG TBCR Take by mouth.    [provider]  estradiol (ESTRACE) 0.1 MG/GM vaginal cream INSERT 1 GRAM VAGINALLY 3 TIMES A WEEK. PEA SIZED AMOUNT PER URETHRA 02/14/17   Hollice Espy, MD  etanercept (ENBREL) 50 MG/ML injection Inject into the skin. 01/21/16   [provider]  fluticasone (FLONASE) 50 MCG/ACT nasal spray Place 2 sprays into both nostrils daily.    [provider]  folic acid (FOLVITE) 1 MG tablet Take 1 mg by mouth daily.    [provider]  metoprolol tartrate (LOPRESSOR) 25 MG tablet Take 25 mg by mouth 2 (two) times daily.    [provider]  naproxen sodium (RA NAPROXEN SODIUM) 220 MG tablet Take by mouth. Reported on 01/22/2016    [provider]  Omega-3 Fatty Acids (FISH OIL PO) Take by mouth.    [provider]  ondansetron (ZOFRAN ODT) 4 MG disintegrating tablet Take 1 tablet (4 mg total) by mouth every 8 (eight) hours as needed for nausea or vomiting. 04/24/18   Carrie Mew, MD  traMADol (ULTRAM) 50 MG tablet Take 50 mg by mouth every 6 (six) hours as needed.    [provider]     Allergies Patient has no known allergies.   Family History  Problem Relation Age of Onset  . Diabetes Mother   . Ovarian cancer Mother   .  Lung cancer Father   . Aneurysm Brother   . Diabetes Brother   . Breast cancer Sister   . Colon cancer Sister   . Diabetes Sister   . Kidney disease Other        nephew  . Bladder Cancer Neg Hx     Social History Social History   Tobacco Use  . Smoking status: Former Smoker    Last attempt to quit: 05/26/1995    Years since quitting: 22.9  . Smokeless tobacco: Never Used  Substance Use Topics  . Alcohol use: No  . Drug use: Never    Review of Systems  Constitutional:   No fever or chills.  ENT:   No sore throat. No rhinorrhea. Cardiovascular:   No chest pain or syncope. Respiratory:   No dyspnea or  cough. Gastrointestinal:   Negative for abdominal pain,positive for vomiting. No constipation. Musculoskeletal:   Negative for focal pain or swelling All other systems reviewed and are negative except as documented above in ROS and HPI.  ____________________________________________   PHYSICAL EXAM:  VITAL SIGNS: ED Triage Vitals  Enc Vitals Group     BP 04/24/18 0852 (!) 189/72     Pulse Rate 04/24/18 0852 64     Resp 04/24/18 0852 20     Temp 04/24/18 0852 97.7 F (36.5 C)     Temp Source 04/24/18 0852 Oral     SpO2 04/24/18 0852 91 %     Weight 04/24/18 0853 120 lb (54.4 kg)     Height 04/24/18 0853 5\' 5"  (1.651 m)     Head Circumference --      Peak Flow --      Pain Score 04/24/18 0903 0     Pain Loc --      Pain Edu? --      Excl. in [place]? --     Vital signs reviewed, nursing assessments reviewed.   Constitutional:   Alert and oriented to person and place. Well appearing and in no distress. Eyes:   Conjunctivae are normal. EOMI. PERRL. ENT      Head:   Normocephalic and atraumatic.      Nose:   No congestion/rhinnorhea.       Mouth/Throat:   MMM, no pharyngeal erythema. No peritonsillar mass.       Neck:   No meningismus. Full ROM. Hematological/Lymphatic/Immunilogical:   No cervical lymphadenopathy. Cardiovascular:   irregularly irregular rhythm, rate in the 80s. Symmetric bilateral radial and DP pulses.  No murmurs.  Respiratory:   Normal respiratory effort without tachypnea/retractions. Breath sounds are clear and equal bilaterally. No wheezes/rales/rhonchi. Gastrointestinal:   Soft and nontender. Non distended. There is no CVA tenderness.  No rebound, rigidity, or guarding.rectal exam reveals brown stool, Hemoccult negative, controls okay Genitourinary:   deferred Musculoskeletal:   Normal range of motion in all extremities. No joint effusions.  No lower extremity tenderness.  No edema. Neurologic:   Normal speech and language.  Motor grossly intact. No acute  focal neurologic deficits are appreciated.  Skin:    Skin is warm, dry and intact. No rash noted.  No petechiae, purpura, or bullae.  ____________________________________________    LABS (pertinent positives/negatives) (all labs ordered are listed, but only abnormal results are displayed) Labs Reviewed  BASIC METABOLIC PANEL - Abnormal; Notable for the following components:      Result Value   Chloride 98 (*)    Glucose, Bld 122 (*)    All other components within normal limits  CBC - Abnormal; Notable for the following components:   RDW 15.4 (*)    All other components within normal limits  URINALYSIS, COMPLETE (UACMP) WITH MICROSCOPIC - Abnormal; Notable for the following components:   Color, Urine YELLOW (*)    APPearance CLOUDY (*)    Hgb urine dipstick SMALL (*)    Ketones, ur 5 (*)    Leukocytes, UA TRACE (*)    Bacteria, UA MANY (*)    All other components within normal limits  URINE CULTURE  CBG MONITORING, ED   ____________________________________________   EKG  interpreted by me Atrial fibrillation rate 91, normal axis and intervals. Normal QRS ST segments and T waves. 3 PVCs on the strip.  ____________________________________________    RADIOLOGY  Ct Abdomen Pelvis W Contrast  Result Date: 04/24/2018 CLINICAL DATA:  Acute abdominal pain EXAM: CT ABDOMEN AND PELVIS WITH CONTRAST TECHNIQUE: Multidetector CT imaging of the abdomen and pelvis was performed using the standard protocol following bolus administration of intravenous contrast. CONTRAST:  43mL ISOVUE-370 IOPAMIDOL (ISOVUE-370) INJECTION 76% COMPARISON:  01/21/2016 FINDINGS: Lower chest: No acute abnormality. Hepatobiliary: Scattered small hepatic cysts are noted. The gallbladder is within normal limits. Pancreas: Rounded cystic lesion is again noted in the region of the pancreatic head measuring approximately 11 mm and relatively stable from the prior study. No other pancreatic abnormality is seen. Spleen:  Normal in size without focal abnormality. Adrenals/Urinary Tract: Adrenal glands are within normal limits bilaterally. Kidneys are well visualized and demonstrate no renal calculi. Exophytic cyst is noted arising from the lower pole of the left kidney stable from the prior exam. No obstructive changes are seen. The bladder is well distended. Stomach/Bowel: Diverticular change of the colon is noted with some smooth muscle hypertrophy. No findings to suggest diverticulitis are seen. No obstructive or inflammatory changes of the bowel are noted. The appendix is within normal limits. Sliding-type hiatal hernia is noted stable from the previous exam. Vascular/Lymphatic: Aortic atherosclerosis. No enlarged abdominal or pelvic lymph nodes. Reproductive: Status post hysterectomy. No adnexal masses. Other: No abdominal wall hernia or abnormality. No abdominopelvic ascites. Musculoskeletal: Mild degenerative changes of lumbar spine are noted. IMPRESSION: Stable cystic changes within the liver, pancreas and left kidney. Diverticular change without diverticulitis. No acute abnormality noted. Electronically Signed   By: Inez Catalina M.D.   On: 04/24/2018 11:02    ____________________________________________   PROCEDURES Procedures  ____________________________________________  DIFFERENTIAL DIAGNOSIS   cystitis, bowel perforation, bowel obstruction, appendicitis  CLINICAL IMPRESSION / ASSESSMENT AND PLAN / ED COURSE  Pertinent labs & imaging results that were available during my care of the patient were reviewed by me and considered in my medical decision making (see chart for details).      Clinical Course as of Apr 24 1448  Tue Apr 24, 2018  1032 Ua reveals UTI. Will give iv ceftriaxone. F/u CT results.    [PS]  1320 CT negative. Hemoccult negative. No evidence of bleeding. Will DC home on abx, f/u pcp. Pt agreeable to this plan.    [PS]    Clinical Course User Index [PS] Carrie Mew, MD      ----------------------------------------- 1:36 PM on 04/24/2018 -----------------------------------------  labs unremarkable except for urinalysis diagnostic of urinary tract infection.  CT unremarkable without evidence of appendicitis, biliary disease, bowel obstruction or perforation or diverticulitis. Patient is well-appearing, nausea controlled, suitable for discharge home on antibiotics and follow up with primary care.patient is tolerating oral intake.  ____________________________________________   FINAL CLINICAL IMPRESSION(S) / ED  DIAGNOSES    Final diagnoses:  Lower urinary tract infectious disease  Non-intractable vomiting with nausea, unspecified vomiting type     ED Discharge Orders        Ordered    cefpodoxime (VANTIN) 100 MG tablet  2 times daily     04/24/18 1448    ondansetron (ZOFRAN ODT) 4 MG disintegrating tablet  Every 8 hours PRN     04/24/18 1448      Portions of this note were generated with dragon dictation software. Dictation errors may occur despite best attempts at proofreading.    Carrie Mew, MD 04/24/18 [id number]

## 2018-04-24 NOTE — ED Notes (Signed)
Pt given sandwich tray and water to drink 

## 2018-04-24 NOTE — ED Notes (Signed)
Daughter and son at bedside, states hx of afib but has not been in it recently.

## 2018-04-24 NOTE — ED Notes (Signed)
Pink pajama top and pants, underwear and slippers placed in pt belongings bag and placed in chair beside bed, pt put in gown, daughter aware of clothing in bag.

## 2018-04-24 NOTE — ED Notes (Signed)
First Nurse Note:  Patient from [facility] complaining of dizziness and nausea starting this AM.  [facility] transported patient.  Patient given Zofran 4 mg. IV, has a 18 ga in Left arm.  HX of urinary of symptoms.  Emesis X 1 enroute.

## 2018-04-24 NOTE — ED Notes (Signed)
Pt placed on bedpan by this RN.

## 2018-04-25 DIAGNOSIS — I10 Essential (primary) hypertension: Secondary | ICD-10-CM | POA: Diagnosis not present

## 2018-04-25 LAB — URINE CULTURE

## 2018-04-26 DIAGNOSIS — I10 Essential (primary) hypertension: Secondary | ICD-10-CM | POA: Diagnosis not present

## 2018-04-27 DIAGNOSIS — G309 Alzheimer's disease, unspecified: Secondary | ICD-10-CM | POA: Diagnosis not present

## 2018-04-27 DIAGNOSIS — R627 Adult failure to thrive: Secondary | ICD-10-CM | POA: Diagnosis not present

## 2018-04-27 DIAGNOSIS — I10 Essential (primary) hypertension: Secondary | ICD-10-CM | POA: Diagnosis not present

## 2018-04-27 DIAGNOSIS — J449 Chronic obstructive pulmonary disease, unspecified: Secondary | ICD-10-CM | POA: Diagnosis not present

## 2018-04-27 DIAGNOSIS — M059 Rheumatoid arthritis with rheumatoid factor, unspecified: Secondary | ICD-10-CM | POA: Diagnosis not present

## 2018-04-27 DIAGNOSIS — N390 Urinary tract infection, site not specified: Secondary | ICD-10-CM | POA: Diagnosis not present

## 2018-04-27 DIAGNOSIS — M1990 Unspecified osteoarthritis, unspecified site: Secondary | ICD-10-CM | POA: Diagnosis not present

## 2018-04-27 DIAGNOSIS — I480 Paroxysmal atrial fibrillation: Secondary | ICD-10-CM | POA: Diagnosis not present

## 2018-04-27 DIAGNOSIS — F0280 Dementia in other diseases classified elsewhere without behavioral disturbance: Secondary | ICD-10-CM | POA: Diagnosis not present

## 2018-04-28 DIAGNOSIS — I10 Essential (primary) hypertension: Secondary | ICD-10-CM | POA: Diagnosis not present

## 2018-04-29 DIAGNOSIS — I10 Essential (primary) hypertension: Secondary | ICD-10-CM | POA: Diagnosis not present

## 2018-04-30 DIAGNOSIS — I10 Essential (primary) hypertension: Secondary | ICD-10-CM | POA: Diagnosis not present

## 2018-05-01 DIAGNOSIS — F0280 Dementia in other diseases classified elsewhere without behavioral disturbance: Secondary | ICD-10-CM | POA: Diagnosis not present

## 2018-05-01 DIAGNOSIS — G309 Alzheimer's disease, unspecified: Secondary | ICD-10-CM | POA: Diagnosis not present

## 2018-05-01 DIAGNOSIS — M059 Rheumatoid arthritis with rheumatoid factor, unspecified: Secondary | ICD-10-CM | POA: Diagnosis not present

## 2018-05-01 DIAGNOSIS — N390 Urinary tract infection, site not specified: Secondary | ICD-10-CM | POA: Diagnosis not present

## 2018-05-01 DIAGNOSIS — J449 Chronic obstructive pulmonary disease, unspecified: Secondary | ICD-10-CM | POA: Diagnosis not present

## 2018-05-01 DIAGNOSIS — I480 Paroxysmal atrial fibrillation: Secondary | ICD-10-CM | POA: Diagnosis not present

## 2018-05-01 DIAGNOSIS — R627 Adult failure to thrive: Secondary | ICD-10-CM | POA: Diagnosis not present

## 2018-05-01 DIAGNOSIS — M1990 Unspecified osteoarthritis, unspecified site: Secondary | ICD-10-CM | POA: Diagnosis not present

## 2018-05-01 DIAGNOSIS — I10 Essential (primary) hypertension: Secondary | ICD-10-CM | POA: Diagnosis not present

## 2018-05-02 DIAGNOSIS — I10 Essential (primary) hypertension: Secondary | ICD-10-CM | POA: Diagnosis not present

## 2018-05-03 DIAGNOSIS — I10 Essential (primary) hypertension: Secondary | ICD-10-CM | POA: Diagnosis not present

## 2018-05-03 DIAGNOSIS — N390 Urinary tract infection, site not specified: Secondary | ICD-10-CM | POA: Diagnosis not present

## 2018-05-03 DIAGNOSIS — I480 Paroxysmal atrial fibrillation: Secondary | ICD-10-CM | POA: Diagnosis not present

## 2018-05-04 DIAGNOSIS — F0280 Dementia in other diseases classified elsewhere without behavioral disturbance: Secondary | ICD-10-CM | POA: Diagnosis not present

## 2018-05-04 DIAGNOSIS — N390 Urinary tract infection, site not specified: Secondary | ICD-10-CM | POA: Diagnosis not present

## 2018-05-04 DIAGNOSIS — I480 Paroxysmal atrial fibrillation: Secondary | ICD-10-CM | POA: Diagnosis not present

## 2018-05-04 DIAGNOSIS — G309 Alzheimer's disease, unspecified: Secondary | ICD-10-CM | POA: Diagnosis not present

## 2018-05-04 DIAGNOSIS — R627 Adult failure to thrive: Secondary | ICD-10-CM | POA: Diagnosis not present

## 2018-05-04 DIAGNOSIS — M059 Rheumatoid arthritis with rheumatoid factor, unspecified: Secondary | ICD-10-CM | POA: Diagnosis not present

## 2018-05-04 DIAGNOSIS — J449 Chronic obstructive pulmonary disease, unspecified: Secondary | ICD-10-CM | POA: Diagnosis not present

## 2018-05-04 DIAGNOSIS — M1990 Unspecified osteoarthritis, unspecified site: Secondary | ICD-10-CM | POA: Diagnosis not present

## 2018-05-04 DIAGNOSIS — I10 Essential (primary) hypertension: Secondary | ICD-10-CM | POA: Diagnosis not present

## 2018-05-05 DIAGNOSIS — I10 Essential (primary) hypertension: Secondary | ICD-10-CM | POA: Diagnosis not present

## 2018-05-06 DIAGNOSIS — I10 Essential (primary) hypertension: Secondary | ICD-10-CM | POA: Diagnosis not present

## 2018-05-07 DIAGNOSIS — G250 Essential tremor: Secondary | ICD-10-CM | POA: Diagnosis not present

## 2018-05-07 DIAGNOSIS — M0579 Rheumatoid arthritis with rheumatoid factor of multiple sites without organ or systems involvement: Secondary | ICD-10-CM | POA: Diagnosis not present

## 2018-05-07 DIAGNOSIS — Z79899 Other long term (current) drug therapy: Secondary | ICD-10-CM | POA: Diagnosis not present

## 2018-05-07 DIAGNOSIS — I10 Essential (primary) hypertension: Secondary | ICD-10-CM | POA: Diagnosis not present

## 2018-05-07 DIAGNOSIS — M810 Age-related osteoporosis without current pathological fracture: Secondary | ICD-10-CM | POA: Diagnosis not present

## 2018-05-07 DIAGNOSIS — L409 Psoriasis, unspecified: Secondary | ICD-10-CM | POA: Diagnosis not present

## 2018-05-07 DIAGNOSIS — I480 Paroxysmal atrial fibrillation: Secondary | ICD-10-CM | POA: Diagnosis not present

## 2018-05-07 DIAGNOSIS — N390 Urinary tract infection, site not specified: Secondary | ICD-10-CM | POA: Diagnosis not present

## 2018-05-08 DIAGNOSIS — R627 Adult failure to thrive: Secondary | ICD-10-CM | POA: Diagnosis not present

## 2018-05-08 DIAGNOSIS — F329 Major depressive disorder, single episode, unspecified: Secondary | ICD-10-CM | POA: Diagnosis not present

## 2018-05-08 DIAGNOSIS — I10 Essential (primary) hypertension: Secondary | ICD-10-CM | POA: Diagnosis not present

## 2018-05-08 DIAGNOSIS — F4323 Adjustment disorder with mixed anxiety and depressed mood: Secondary | ICD-10-CM | POA: Diagnosis not present

## 2018-05-08 DIAGNOSIS — G309 Alzheimer's disease, unspecified: Secondary | ICD-10-CM | POA: Diagnosis not present

## 2018-05-08 DIAGNOSIS — F0280 Dementia in other diseases classified elsewhere without behavioral disturbance: Secondary | ICD-10-CM | POA: Diagnosis not present

## 2018-05-08 DIAGNOSIS — I480 Paroxysmal atrial fibrillation: Secondary | ICD-10-CM | POA: Diagnosis not present

## 2018-05-08 DIAGNOSIS — N390 Urinary tract infection, site not specified: Secondary | ICD-10-CM | POA: Diagnosis not present

## 2018-05-08 DIAGNOSIS — M1990 Unspecified osteoarthritis, unspecified site: Secondary | ICD-10-CM | POA: Diagnosis not present

## 2018-05-08 DIAGNOSIS — J449 Chronic obstructive pulmonary disease, unspecified: Secondary | ICD-10-CM | POA: Diagnosis not present

## 2018-05-08 DIAGNOSIS — M059 Rheumatoid arthritis with rheumatoid factor, unspecified: Secondary | ICD-10-CM | POA: Diagnosis not present

## 2018-05-09 DIAGNOSIS — I10 Essential (primary) hypertension: Secondary | ICD-10-CM | POA: Diagnosis not present

## 2018-05-10 DIAGNOSIS — M1990 Unspecified osteoarthritis, unspecified site: Secondary | ICD-10-CM | POA: Diagnosis not present

## 2018-05-10 DIAGNOSIS — I10 Essential (primary) hypertension: Secondary | ICD-10-CM | POA: Diagnosis not present

## 2018-05-10 DIAGNOSIS — B351 Tinea unguium: Secondary | ICD-10-CM | POA: Diagnosis not present

## 2018-05-10 DIAGNOSIS — N390 Urinary tract infection, site not specified: Secondary | ICD-10-CM | POA: Diagnosis not present

## 2018-05-10 DIAGNOSIS — M059 Rheumatoid arthritis with rheumatoid factor, unspecified: Secondary | ICD-10-CM | POA: Diagnosis not present

## 2018-05-10 DIAGNOSIS — I739 Peripheral vascular disease, unspecified: Secondary | ICD-10-CM | POA: Diagnosis not present

## 2018-05-10 DIAGNOSIS — J449 Chronic obstructive pulmonary disease, unspecified: Secondary | ICD-10-CM | POA: Diagnosis not present

## 2018-05-10 DIAGNOSIS — I480 Paroxysmal atrial fibrillation: Secondary | ICD-10-CM | POA: Diagnosis not present

## 2018-05-10 DIAGNOSIS — G309 Alzheimer's disease, unspecified: Secondary | ICD-10-CM | POA: Diagnosis not present

## 2018-05-10 DIAGNOSIS — F0280 Dementia in other diseases classified elsewhere without behavioral disturbance: Secondary | ICD-10-CM | POA: Diagnosis not present

## 2018-05-10 DIAGNOSIS — R627 Adult failure to thrive: Secondary | ICD-10-CM | POA: Diagnosis not present

## 2018-05-11 DIAGNOSIS — N390 Urinary tract infection, site not specified: Secondary | ICD-10-CM | POA: Diagnosis not present

## 2018-05-11 DIAGNOSIS — I10 Essential (primary) hypertension: Secondary | ICD-10-CM | POA: Diagnosis not present

## 2018-05-11 DIAGNOSIS — I480 Paroxysmal atrial fibrillation: Secondary | ICD-10-CM | POA: Diagnosis not present

## 2018-05-12 DIAGNOSIS — I10 Essential (primary) hypertension: Secondary | ICD-10-CM | POA: Diagnosis not present

## 2018-05-13 DIAGNOSIS — I10 Essential (primary) hypertension: Secondary | ICD-10-CM | POA: Diagnosis not present

## 2018-05-14 DIAGNOSIS — I10 Essential (primary) hypertension: Secondary | ICD-10-CM | POA: Diagnosis not present

## 2018-05-15 DIAGNOSIS — I480 Paroxysmal atrial fibrillation: Secondary | ICD-10-CM | POA: Diagnosis not present

## 2018-05-15 DIAGNOSIS — N390 Urinary tract infection, site not specified: Secondary | ICD-10-CM | POA: Diagnosis not present

## 2018-05-15 DIAGNOSIS — M059 Rheumatoid arthritis with rheumatoid factor, unspecified: Secondary | ICD-10-CM | POA: Diagnosis not present

## 2018-05-15 DIAGNOSIS — I10 Essential (primary) hypertension: Secondary | ICD-10-CM | POA: Diagnosis not present

## 2018-05-15 DIAGNOSIS — M1990 Unspecified osteoarthritis, unspecified site: Secondary | ICD-10-CM | POA: Diagnosis not present

## 2018-05-15 DIAGNOSIS — J449 Chronic obstructive pulmonary disease, unspecified: Secondary | ICD-10-CM | POA: Diagnosis not present

## 2018-05-15 DIAGNOSIS — R627 Adult failure to thrive: Secondary | ICD-10-CM | POA: Diagnosis not present

## 2018-05-15 DIAGNOSIS — F0280 Dementia in other diseases classified elsewhere without behavioral disturbance: Secondary | ICD-10-CM | POA: Diagnosis not present

## 2018-05-15 DIAGNOSIS — G309 Alzheimer's disease, unspecified: Secondary | ICD-10-CM | POA: Diagnosis not present

## 2018-05-16 DIAGNOSIS — I10 Essential (primary) hypertension: Secondary | ICD-10-CM | POA: Diagnosis not present

## 2018-05-17 DIAGNOSIS — I10 Essential (primary) hypertension: Secondary | ICD-10-CM | POA: Diagnosis not present

## 2018-05-18 DIAGNOSIS — I10 Essential (primary) hypertension: Secondary | ICD-10-CM | POA: Diagnosis not present

## 2018-05-18 DIAGNOSIS — N390 Urinary tract infection, site not specified: Secondary | ICD-10-CM | POA: Diagnosis not present

## 2018-05-18 DIAGNOSIS — I480 Paroxysmal atrial fibrillation: Secondary | ICD-10-CM | POA: Diagnosis not present

## 2018-05-19 DIAGNOSIS — I10 Essential (primary) hypertension: Secondary | ICD-10-CM | POA: Diagnosis not present

## 2018-05-20 DIAGNOSIS — I10 Essential (primary) hypertension: Secondary | ICD-10-CM | POA: Diagnosis not present

## 2018-05-21 DIAGNOSIS — J449 Chronic obstructive pulmonary disease, unspecified: Secondary | ICD-10-CM | POA: Diagnosis not present

## 2018-05-21 DIAGNOSIS — G309 Alzheimer's disease, unspecified: Secondary | ICD-10-CM | POA: Diagnosis not present

## 2018-05-21 DIAGNOSIS — F0390 Unspecified dementia without behavioral disturbance: Secondary | ICD-10-CM | POA: Diagnosis not present

## 2018-05-21 DIAGNOSIS — M059 Rheumatoid arthritis with rheumatoid factor, unspecified: Secondary | ICD-10-CM | POA: Diagnosis not present

## 2018-05-21 DIAGNOSIS — M1990 Unspecified osteoarthritis, unspecified site: Secondary | ICD-10-CM | POA: Diagnosis not present

## 2018-05-21 DIAGNOSIS — N390 Urinary tract infection, site not specified: Secondary | ICD-10-CM | POA: Diagnosis not present

## 2018-05-21 DIAGNOSIS — I10 Essential (primary) hypertension: Secondary | ICD-10-CM | POA: Diagnosis not present

## 2018-05-21 DIAGNOSIS — F0280 Dementia in other diseases classified elsewhere without behavioral disturbance: Secondary | ICD-10-CM | POA: Diagnosis not present

## 2018-05-21 DIAGNOSIS — R627 Adult failure to thrive: Secondary | ICD-10-CM | POA: Diagnosis not present

## 2018-05-21 DIAGNOSIS — I480 Paroxysmal atrial fibrillation: Secondary | ICD-10-CM | POA: Diagnosis not present

## 2018-05-22 DIAGNOSIS — M059 Rheumatoid arthritis with rheumatoid factor, unspecified: Secondary | ICD-10-CM | POA: Diagnosis not present

## 2018-05-22 DIAGNOSIS — M1990 Unspecified osteoarthritis, unspecified site: Secondary | ICD-10-CM | POA: Diagnosis not present

## 2018-05-22 DIAGNOSIS — F0280 Dementia in other diseases classified elsewhere without behavioral disturbance: Secondary | ICD-10-CM | POA: Diagnosis not present

## 2018-05-22 DIAGNOSIS — J449 Chronic obstructive pulmonary disease, unspecified: Secondary | ICD-10-CM | POA: Diagnosis not present

## 2018-05-22 DIAGNOSIS — I480 Paroxysmal atrial fibrillation: Secondary | ICD-10-CM | POA: Diagnosis not present

## 2018-05-22 DIAGNOSIS — G309 Alzheimer's disease, unspecified: Secondary | ICD-10-CM | POA: Diagnosis not present

## 2018-05-22 DIAGNOSIS — I10 Essential (primary) hypertension: Secondary | ICD-10-CM | POA: Diagnosis not present

## 2018-05-22 DIAGNOSIS — R627 Adult failure to thrive: Secondary | ICD-10-CM | POA: Diagnosis not present

## 2018-05-22 DIAGNOSIS — N390 Urinary tract infection, site not specified: Secondary | ICD-10-CM | POA: Diagnosis not present

## 2018-05-23 DIAGNOSIS — I10 Essential (primary) hypertension: Secondary | ICD-10-CM | POA: Diagnosis not present

## 2018-05-24 DIAGNOSIS — I10 Essential (primary) hypertension: Secondary | ICD-10-CM | POA: Diagnosis not present

## 2018-05-24 DIAGNOSIS — R011 Cardiac murmur, unspecified: Secondary | ICD-10-CM | POA: Diagnosis not present

## 2018-05-25 DIAGNOSIS — M1990 Unspecified osteoarthritis, unspecified site: Secondary | ICD-10-CM | POA: Diagnosis not present

## 2018-05-25 DIAGNOSIS — R627 Adult failure to thrive: Secondary | ICD-10-CM | POA: Diagnosis not present

## 2018-05-25 DIAGNOSIS — G309 Alzheimer's disease, unspecified: Secondary | ICD-10-CM | POA: Diagnosis not present

## 2018-05-25 DIAGNOSIS — M059 Rheumatoid arthritis with rheumatoid factor, unspecified: Secondary | ICD-10-CM | POA: Diagnosis not present

## 2018-05-25 DIAGNOSIS — F0280 Dementia in other diseases classified elsewhere without behavioral disturbance: Secondary | ICD-10-CM | POA: Diagnosis not present

## 2018-05-25 DIAGNOSIS — N390 Urinary tract infection, site not specified: Secondary | ICD-10-CM | POA: Diagnosis not present

## 2018-05-25 DIAGNOSIS — J449 Chronic obstructive pulmonary disease, unspecified: Secondary | ICD-10-CM | POA: Diagnosis not present

## 2018-05-25 DIAGNOSIS — I10 Essential (primary) hypertension: Secondary | ICD-10-CM | POA: Diagnosis not present

## 2018-05-25 DIAGNOSIS — I480 Paroxysmal atrial fibrillation: Secondary | ICD-10-CM | POA: Diagnosis not present

## 2018-05-26 DIAGNOSIS — I10 Essential (primary) hypertension: Secondary | ICD-10-CM | POA: Diagnosis not present

## 2018-05-27 DIAGNOSIS — I10 Essential (primary) hypertension: Secondary | ICD-10-CM | POA: Diagnosis not present

## 2018-05-28 DIAGNOSIS — I10 Essential (primary) hypertension: Secondary | ICD-10-CM | POA: Diagnosis not present

## 2018-05-29 DIAGNOSIS — I10 Essential (primary) hypertension: Secondary | ICD-10-CM | POA: Diagnosis not present

## 2018-05-30 DIAGNOSIS — I10 Essential (primary) hypertension: Secondary | ICD-10-CM | POA: Diagnosis not present

## 2018-05-31 DIAGNOSIS — G309 Alzheimer's disease, unspecified: Secondary | ICD-10-CM | POA: Diagnosis not present

## 2018-05-31 DIAGNOSIS — I480 Paroxysmal atrial fibrillation: Secondary | ICD-10-CM | POA: Diagnosis not present

## 2018-05-31 DIAGNOSIS — F0280 Dementia in other diseases classified elsewhere without behavioral disturbance: Secondary | ICD-10-CM | POA: Diagnosis not present

## 2018-05-31 DIAGNOSIS — I10 Essential (primary) hypertension: Secondary | ICD-10-CM | POA: Diagnosis not present

## 2018-05-31 DIAGNOSIS — M059 Rheumatoid arthritis with rheumatoid factor, unspecified: Secondary | ICD-10-CM | POA: Diagnosis not present

## 2018-05-31 DIAGNOSIS — J449 Chronic obstructive pulmonary disease, unspecified: Secondary | ICD-10-CM | POA: Diagnosis not present

## 2018-05-31 DIAGNOSIS — N390 Urinary tract infection, site not specified: Secondary | ICD-10-CM | POA: Diagnosis not present

## 2018-05-31 DIAGNOSIS — R627 Adult failure to thrive: Secondary | ICD-10-CM | POA: Diagnosis not present

## 2018-05-31 DIAGNOSIS — M1990 Unspecified osteoarthritis, unspecified site: Secondary | ICD-10-CM | POA: Diagnosis not present

## 2018-06-01 DIAGNOSIS — N390 Urinary tract infection, site not specified: Secondary | ICD-10-CM | POA: Diagnosis not present

## 2018-06-01 DIAGNOSIS — G309 Alzheimer's disease, unspecified: Secondary | ICD-10-CM | POA: Diagnosis not present

## 2018-06-01 DIAGNOSIS — M059 Rheumatoid arthritis with rheumatoid factor, unspecified: Secondary | ICD-10-CM | POA: Diagnosis not present

## 2018-06-01 DIAGNOSIS — M1990 Unspecified osteoarthritis, unspecified site: Secondary | ICD-10-CM | POA: Diagnosis not present

## 2018-06-01 DIAGNOSIS — R627 Adult failure to thrive: Secondary | ICD-10-CM | POA: Diagnosis not present

## 2018-06-01 DIAGNOSIS — I10 Essential (primary) hypertension: Secondary | ICD-10-CM | POA: Diagnosis not present

## 2018-06-01 DIAGNOSIS — I480 Paroxysmal atrial fibrillation: Secondary | ICD-10-CM | POA: Diagnosis not present

## 2018-06-01 DIAGNOSIS — F0280 Dementia in other diseases classified elsewhere without behavioral disturbance: Secondary | ICD-10-CM | POA: Diagnosis not present

## 2018-06-01 DIAGNOSIS — J449 Chronic obstructive pulmonary disease, unspecified: Secondary | ICD-10-CM | POA: Diagnosis not present

## 2018-06-02 DIAGNOSIS — I10 Essential (primary) hypertension: Secondary | ICD-10-CM | POA: Diagnosis not present

## 2018-06-03 DIAGNOSIS — I10 Essential (primary) hypertension: Secondary | ICD-10-CM | POA: Diagnosis not present

## 2018-06-04 DIAGNOSIS — I10 Essential (primary) hypertension: Secondary | ICD-10-CM | POA: Diagnosis not present

## 2018-06-05 DIAGNOSIS — I480 Paroxysmal atrial fibrillation: Secondary | ICD-10-CM | POA: Diagnosis not present

## 2018-06-05 DIAGNOSIS — G309 Alzheimer's disease, unspecified: Secondary | ICD-10-CM | POA: Diagnosis not present

## 2018-06-05 DIAGNOSIS — J449 Chronic obstructive pulmonary disease, unspecified: Secondary | ICD-10-CM | POA: Diagnosis not present

## 2018-06-05 DIAGNOSIS — M059 Rheumatoid arthritis with rheumatoid factor, unspecified: Secondary | ICD-10-CM | POA: Diagnosis not present

## 2018-06-05 DIAGNOSIS — I10 Essential (primary) hypertension: Secondary | ICD-10-CM | POA: Diagnosis not present

## 2018-06-05 DIAGNOSIS — R627 Adult failure to thrive: Secondary | ICD-10-CM | POA: Diagnosis not present

## 2018-06-05 DIAGNOSIS — M1990 Unspecified osteoarthritis, unspecified site: Secondary | ICD-10-CM | POA: Diagnosis not present

## 2018-06-05 DIAGNOSIS — N390 Urinary tract infection, site not specified: Secondary | ICD-10-CM | POA: Diagnosis not present

## 2018-06-05 DIAGNOSIS — F0280 Dementia in other diseases classified elsewhere without behavioral disturbance: Secondary | ICD-10-CM | POA: Diagnosis not present

## 2018-06-06 DIAGNOSIS — I10 Essential (primary) hypertension: Secondary | ICD-10-CM | POA: Diagnosis not present

## 2018-06-07 DIAGNOSIS — I480 Paroxysmal atrial fibrillation: Secondary | ICD-10-CM | POA: Diagnosis not present

## 2018-06-07 DIAGNOSIS — G309 Alzheimer's disease, unspecified: Secondary | ICD-10-CM | POA: Diagnosis not present

## 2018-06-07 DIAGNOSIS — J449 Chronic obstructive pulmonary disease, unspecified: Secondary | ICD-10-CM | POA: Diagnosis not present

## 2018-06-07 DIAGNOSIS — I10 Essential (primary) hypertension: Secondary | ICD-10-CM | POA: Diagnosis not present

## 2018-06-07 DIAGNOSIS — R627 Adult failure to thrive: Secondary | ICD-10-CM | POA: Diagnosis not present

## 2018-06-07 DIAGNOSIS — N390 Urinary tract infection, site not specified: Secondary | ICD-10-CM | POA: Diagnosis not present

## 2018-06-07 DIAGNOSIS — F0280 Dementia in other diseases classified elsewhere without behavioral disturbance: Secondary | ICD-10-CM | POA: Diagnosis not present

## 2018-06-07 DIAGNOSIS — M059 Rheumatoid arthritis with rheumatoid factor, unspecified: Secondary | ICD-10-CM | POA: Diagnosis not present

## 2018-06-07 DIAGNOSIS — M1990 Unspecified osteoarthritis, unspecified site: Secondary | ICD-10-CM | POA: Diagnosis not present

## 2018-06-08 DIAGNOSIS — I10 Essential (primary) hypertension: Secondary | ICD-10-CM | POA: Diagnosis not present

## 2018-06-09 DIAGNOSIS — I10 Essential (primary) hypertension: Secondary | ICD-10-CM | POA: Diagnosis not present

## 2018-06-10 DIAGNOSIS — I10 Essential (primary) hypertension: Secondary | ICD-10-CM | POA: Diagnosis not present

## 2018-06-11 DIAGNOSIS — G309 Alzheimer's disease, unspecified: Secondary | ICD-10-CM | POA: Diagnosis not present

## 2018-06-11 DIAGNOSIS — N390 Urinary tract infection, site not specified: Secondary | ICD-10-CM | POA: Diagnosis not present

## 2018-06-11 DIAGNOSIS — M1990 Unspecified osteoarthritis, unspecified site: Secondary | ICD-10-CM | POA: Diagnosis not present

## 2018-06-11 DIAGNOSIS — R627 Adult failure to thrive: Secondary | ICD-10-CM | POA: Diagnosis not present

## 2018-06-11 DIAGNOSIS — I10 Essential (primary) hypertension: Secondary | ICD-10-CM | POA: Diagnosis not present

## 2018-06-11 DIAGNOSIS — F0280 Dementia in other diseases classified elsewhere without behavioral disturbance: Secondary | ICD-10-CM | POA: Diagnosis not present

## 2018-06-11 DIAGNOSIS — I480 Paroxysmal atrial fibrillation: Secondary | ICD-10-CM | POA: Diagnosis not present

## 2018-06-11 DIAGNOSIS — M059 Rheumatoid arthritis with rheumatoid factor, unspecified: Secondary | ICD-10-CM | POA: Diagnosis not present

## 2018-06-11 DIAGNOSIS — J449 Chronic obstructive pulmonary disease, unspecified: Secondary | ICD-10-CM | POA: Diagnosis not present

## 2018-06-12 DIAGNOSIS — I10 Essential (primary) hypertension: Secondary | ICD-10-CM | POA: Diagnosis not present

## 2018-06-13 DIAGNOSIS — M1990 Unspecified osteoarthritis, unspecified site: Secondary | ICD-10-CM | POA: Diagnosis not present

## 2018-06-13 DIAGNOSIS — Z79899 Other long term (current) drug therapy: Secondary | ICD-10-CM | POA: Diagnosis not present

## 2018-06-13 DIAGNOSIS — G309 Alzheimer's disease, unspecified: Secondary | ICD-10-CM | POA: Diagnosis not present

## 2018-06-13 DIAGNOSIS — D518 Other vitamin B12 deficiency anemias: Secondary | ICD-10-CM | POA: Diagnosis not present

## 2018-06-13 DIAGNOSIS — J449 Chronic obstructive pulmonary disease, unspecified: Secondary | ICD-10-CM | POA: Diagnosis not present

## 2018-06-13 DIAGNOSIS — E559 Vitamin D deficiency, unspecified: Secondary | ICD-10-CM | POA: Diagnosis not present

## 2018-06-13 DIAGNOSIS — E7849 Other hyperlipidemia: Secondary | ICD-10-CM | POA: Diagnosis not present

## 2018-06-13 DIAGNOSIS — E038 Other specified hypothyroidism: Secondary | ICD-10-CM | POA: Diagnosis not present

## 2018-06-13 DIAGNOSIS — N390 Urinary tract infection, site not specified: Secondary | ICD-10-CM | POA: Diagnosis not present

## 2018-06-13 DIAGNOSIS — I10 Essential (primary) hypertension: Secondary | ICD-10-CM | POA: Diagnosis not present

## 2018-06-13 DIAGNOSIS — F0280 Dementia in other diseases classified elsewhere without behavioral disturbance: Secondary | ICD-10-CM | POA: Diagnosis not present

## 2018-06-13 DIAGNOSIS — I480 Paroxysmal atrial fibrillation: Secondary | ICD-10-CM | POA: Diagnosis not present

## 2018-06-13 DIAGNOSIS — R627 Adult failure to thrive: Secondary | ICD-10-CM | POA: Diagnosis not present

## 2018-06-13 DIAGNOSIS — E119 Type 2 diabetes mellitus without complications: Secondary | ICD-10-CM | POA: Diagnosis not present

## 2018-06-13 DIAGNOSIS — M059 Rheumatoid arthritis with rheumatoid factor, unspecified: Secondary | ICD-10-CM | POA: Diagnosis not present

## 2018-06-14 DIAGNOSIS — I10 Essential (primary) hypertension: Secondary | ICD-10-CM | POA: Diagnosis not present

## 2018-06-15 DIAGNOSIS — I10 Essential (primary) hypertension: Secondary | ICD-10-CM | POA: Diagnosis not present

## 2018-06-16 DIAGNOSIS — I10 Essential (primary) hypertension: Secondary | ICD-10-CM | POA: Diagnosis not present

## 2018-06-17 DIAGNOSIS — I10 Essential (primary) hypertension: Secondary | ICD-10-CM | POA: Diagnosis not present

## 2018-06-18 DIAGNOSIS — I10 Essential (primary) hypertension: Secondary | ICD-10-CM | POA: Diagnosis not present

## 2018-06-19 DIAGNOSIS — I10 Essential (primary) hypertension: Secondary | ICD-10-CM | POA: Diagnosis not present

## 2018-06-19 DIAGNOSIS — M059 Rheumatoid arthritis with rheumatoid factor, unspecified: Secondary | ICD-10-CM | POA: Diagnosis not present

## 2018-06-19 DIAGNOSIS — R627 Adult failure to thrive: Secondary | ICD-10-CM | POA: Diagnosis not present

## 2018-06-19 DIAGNOSIS — N390 Urinary tract infection, site not specified: Secondary | ICD-10-CM | POA: Diagnosis not present

## 2018-06-19 DIAGNOSIS — M1990 Unspecified osteoarthritis, unspecified site: Secondary | ICD-10-CM | POA: Diagnosis not present

## 2018-06-19 DIAGNOSIS — I480 Paroxysmal atrial fibrillation: Secondary | ICD-10-CM | POA: Diagnosis not present

## 2018-06-19 DIAGNOSIS — F0280 Dementia in other diseases classified elsewhere without behavioral disturbance: Secondary | ICD-10-CM | POA: Diagnosis not present

## 2018-06-19 DIAGNOSIS — G309 Alzheimer's disease, unspecified: Secondary | ICD-10-CM | POA: Diagnosis not present

## 2018-06-19 DIAGNOSIS — J449 Chronic obstructive pulmonary disease, unspecified: Secondary | ICD-10-CM | POA: Diagnosis not present

## 2018-06-20 DIAGNOSIS — I10 Essential (primary) hypertension: Secondary | ICD-10-CM | POA: Diagnosis not present

## 2018-06-21 DIAGNOSIS — G309 Alzheimer's disease, unspecified: Secondary | ICD-10-CM | POA: Diagnosis not present

## 2018-06-21 DIAGNOSIS — M1990 Unspecified osteoarthritis, unspecified site: Secondary | ICD-10-CM | POA: Diagnosis not present

## 2018-06-21 DIAGNOSIS — I10 Essential (primary) hypertension: Secondary | ICD-10-CM | POA: Diagnosis not present

## 2018-06-21 DIAGNOSIS — J449 Chronic obstructive pulmonary disease, unspecified: Secondary | ICD-10-CM | POA: Diagnosis not present

## 2018-06-21 DIAGNOSIS — F0280 Dementia in other diseases classified elsewhere without behavioral disturbance: Secondary | ICD-10-CM | POA: Diagnosis not present

## 2018-06-21 DIAGNOSIS — N390 Urinary tract infection, site not specified: Secondary | ICD-10-CM | POA: Diagnosis not present

## 2018-06-21 DIAGNOSIS — R627 Adult failure to thrive: Secondary | ICD-10-CM | POA: Diagnosis not present

## 2018-06-21 DIAGNOSIS — M059 Rheumatoid arthritis with rheumatoid factor, unspecified: Secondary | ICD-10-CM | POA: Diagnosis not present

## 2018-06-21 DIAGNOSIS — I480 Paroxysmal atrial fibrillation: Secondary | ICD-10-CM | POA: Diagnosis not present

## 2018-06-22 DIAGNOSIS — I10 Essential (primary) hypertension: Secondary | ICD-10-CM | POA: Diagnosis not present

## 2018-06-23 DIAGNOSIS — I10 Essential (primary) hypertension: Secondary | ICD-10-CM | POA: Diagnosis not present

## 2018-06-24 DIAGNOSIS — I10 Essential (primary) hypertension: Secondary | ICD-10-CM | POA: Diagnosis not present

## 2018-06-25 DIAGNOSIS — I10 Essential (primary) hypertension: Secondary | ICD-10-CM | POA: Diagnosis not present

## 2018-06-26 DIAGNOSIS — I10 Essential (primary) hypertension: Secondary | ICD-10-CM | POA: Diagnosis not present

## 2018-06-27 DIAGNOSIS — I480 Paroxysmal atrial fibrillation: Secondary | ICD-10-CM | POA: Diagnosis not present

## 2018-06-27 DIAGNOSIS — R2689 Other abnormalities of gait and mobility: Secondary | ICD-10-CM | POA: Diagnosis not present

## 2018-06-27 DIAGNOSIS — J449 Chronic obstructive pulmonary disease, unspecified: Secondary | ICD-10-CM | POA: Diagnosis not present

## 2018-06-27 DIAGNOSIS — R627 Adult failure to thrive: Secondary | ICD-10-CM | POA: Diagnosis not present

## 2018-06-27 DIAGNOSIS — I10 Essential (primary) hypertension: Secondary | ICD-10-CM | POA: Diagnosis not present

## 2018-06-27 DIAGNOSIS — G309 Alzheimer's disease, unspecified: Secondary | ICD-10-CM | POA: Diagnosis not present

## 2018-06-27 DIAGNOSIS — M1990 Unspecified osteoarthritis, unspecified site: Secondary | ICD-10-CM | POA: Diagnosis not present

## 2018-06-27 DIAGNOSIS — F0280 Dementia in other diseases classified elsewhere without behavioral disturbance: Secondary | ICD-10-CM | POA: Diagnosis not present

## 2018-06-27 DIAGNOSIS — M059 Rheumatoid arthritis with rheumatoid factor, unspecified: Secondary | ICD-10-CM | POA: Diagnosis not present

## 2018-06-28 DIAGNOSIS — I10 Essential (primary) hypertension: Secondary | ICD-10-CM | POA: Diagnosis not present

## 2018-06-29 DIAGNOSIS — G309 Alzheimer's disease, unspecified: Secondary | ICD-10-CM | POA: Diagnosis not present

## 2018-06-29 DIAGNOSIS — I10 Essential (primary) hypertension: Secondary | ICD-10-CM | POA: Diagnosis not present

## 2018-06-29 DIAGNOSIS — R627 Adult failure to thrive: Secondary | ICD-10-CM | POA: Diagnosis not present

## 2018-06-29 DIAGNOSIS — I480 Paroxysmal atrial fibrillation: Secondary | ICD-10-CM | POA: Diagnosis not present

## 2018-06-30 DIAGNOSIS — I10 Essential (primary) hypertension: Secondary | ICD-10-CM | POA: Diagnosis not present

## 2018-07-01 DIAGNOSIS — I10 Essential (primary) hypertension: Secondary | ICD-10-CM | POA: Diagnosis not present

## 2018-07-02 DIAGNOSIS — I10 Essential (primary) hypertension: Secondary | ICD-10-CM | POA: Diagnosis not present

## 2018-07-03 DIAGNOSIS — R627 Adult failure to thrive: Secondary | ICD-10-CM | POA: Diagnosis not present

## 2018-07-03 DIAGNOSIS — I480 Paroxysmal atrial fibrillation: Secondary | ICD-10-CM | POA: Diagnosis not present

## 2018-07-03 DIAGNOSIS — I10 Essential (primary) hypertension: Secondary | ICD-10-CM | POA: Diagnosis not present

## 2018-07-03 DIAGNOSIS — G309 Alzheimer's disease, unspecified: Secondary | ICD-10-CM | POA: Diagnosis not present

## 2018-07-04 DIAGNOSIS — M059 Rheumatoid arthritis with rheumatoid factor, unspecified: Secondary | ICD-10-CM | POA: Diagnosis not present

## 2018-07-04 DIAGNOSIS — I10 Essential (primary) hypertension: Secondary | ICD-10-CM | POA: Diagnosis not present

## 2018-07-04 DIAGNOSIS — R2689 Other abnormalities of gait and mobility: Secondary | ICD-10-CM | POA: Diagnosis not present

## 2018-07-04 DIAGNOSIS — F0280 Dementia in other diseases classified elsewhere without behavioral disturbance: Secondary | ICD-10-CM | POA: Diagnosis not present

## 2018-07-04 DIAGNOSIS — G309 Alzheimer's disease, unspecified: Secondary | ICD-10-CM | POA: Diagnosis not present

## 2018-07-05 DIAGNOSIS — I10 Essential (primary) hypertension: Secondary | ICD-10-CM | POA: Diagnosis not present

## 2018-07-06 DIAGNOSIS — I10 Essential (primary) hypertension: Secondary | ICD-10-CM | POA: Diagnosis not present

## 2018-07-06 DIAGNOSIS — G309 Alzheimer's disease, unspecified: Secondary | ICD-10-CM | POA: Diagnosis not present

## 2018-07-06 DIAGNOSIS — I480 Paroxysmal atrial fibrillation: Secondary | ICD-10-CM | POA: Diagnosis not present

## 2018-07-06 DIAGNOSIS — R627 Adult failure to thrive: Secondary | ICD-10-CM | POA: Diagnosis not present

## 2018-07-07 DIAGNOSIS — I10 Essential (primary) hypertension: Secondary | ICD-10-CM | POA: Diagnosis not present

## 2018-07-08 DIAGNOSIS — I10 Essential (primary) hypertension: Secondary | ICD-10-CM | POA: Diagnosis not present

## 2018-07-09 DIAGNOSIS — I10 Essential (primary) hypertension: Secondary | ICD-10-CM | POA: Diagnosis not present

## 2018-07-10 DIAGNOSIS — R627 Adult failure to thrive: Secondary | ICD-10-CM | POA: Diagnosis not present

## 2018-07-10 DIAGNOSIS — F0280 Dementia in other diseases classified elsewhere without behavioral disturbance: Secondary | ICD-10-CM | POA: Diagnosis not present

## 2018-07-10 DIAGNOSIS — I10 Essential (primary) hypertension: Secondary | ICD-10-CM | POA: Diagnosis not present

## 2018-07-10 DIAGNOSIS — G309 Alzheimer's disease, unspecified: Secondary | ICD-10-CM | POA: Diagnosis not present

## 2018-07-10 DIAGNOSIS — J449 Chronic obstructive pulmonary disease, unspecified: Secondary | ICD-10-CM | POA: Diagnosis not present

## 2018-07-10 DIAGNOSIS — M1990 Unspecified osteoarthritis, unspecified site: Secondary | ICD-10-CM | POA: Diagnosis not present

## 2018-07-10 DIAGNOSIS — M059 Rheumatoid arthritis with rheumatoid factor, unspecified: Secondary | ICD-10-CM | POA: Diagnosis not present

## 2018-07-10 DIAGNOSIS — R2689 Other abnormalities of gait and mobility: Secondary | ICD-10-CM | POA: Diagnosis not present

## 2018-07-10 DIAGNOSIS — I480 Paroxysmal atrial fibrillation: Secondary | ICD-10-CM | POA: Diagnosis not present

## 2018-07-11 DIAGNOSIS — R627 Adult failure to thrive: Secondary | ICD-10-CM | POA: Diagnosis not present

## 2018-07-11 DIAGNOSIS — M1990 Unspecified osteoarthritis, unspecified site: Secondary | ICD-10-CM | POA: Diagnosis not present

## 2018-07-11 DIAGNOSIS — F0280 Dementia in other diseases classified elsewhere without behavioral disturbance: Secondary | ICD-10-CM | POA: Diagnosis not present

## 2018-07-11 DIAGNOSIS — M059 Rheumatoid arthritis with rheumatoid factor, unspecified: Secondary | ICD-10-CM | POA: Diagnosis not present

## 2018-07-11 DIAGNOSIS — I10 Essential (primary) hypertension: Secondary | ICD-10-CM | POA: Diagnosis not present

## 2018-07-11 DIAGNOSIS — I480 Paroxysmal atrial fibrillation: Secondary | ICD-10-CM | POA: Diagnosis not present

## 2018-07-11 DIAGNOSIS — J449 Chronic obstructive pulmonary disease, unspecified: Secondary | ICD-10-CM | POA: Diagnosis not present

## 2018-07-11 DIAGNOSIS — G309 Alzheimer's disease, unspecified: Secondary | ICD-10-CM | POA: Diagnosis not present

## 2018-07-11 DIAGNOSIS — R2689 Other abnormalities of gait and mobility: Secondary | ICD-10-CM | POA: Diagnosis not present

## 2018-07-12 DIAGNOSIS — I10 Essential (primary) hypertension: Secondary | ICD-10-CM | POA: Diagnosis not present

## 2018-07-13 DIAGNOSIS — I10 Essential (primary) hypertension: Secondary | ICD-10-CM | POA: Diagnosis not present

## 2018-07-14 DIAGNOSIS — I10 Essential (primary) hypertension: Secondary | ICD-10-CM | POA: Diagnosis not present

## 2018-07-15 DIAGNOSIS — I10 Essential (primary) hypertension: Secondary | ICD-10-CM | POA: Diagnosis not present

## 2018-07-16 DIAGNOSIS — R627 Adult failure to thrive: Secondary | ICD-10-CM | POA: Diagnosis not present

## 2018-07-16 DIAGNOSIS — F0280 Dementia in other diseases classified elsewhere without behavioral disturbance: Secondary | ICD-10-CM | POA: Diagnosis not present

## 2018-07-16 DIAGNOSIS — J449 Chronic obstructive pulmonary disease, unspecified: Secondary | ICD-10-CM | POA: Diagnosis not present

## 2018-07-16 DIAGNOSIS — M059 Rheumatoid arthritis with rheumatoid factor, unspecified: Secondary | ICD-10-CM | POA: Diagnosis not present

## 2018-07-16 DIAGNOSIS — I10 Essential (primary) hypertension: Secondary | ICD-10-CM | POA: Diagnosis not present

## 2018-07-16 DIAGNOSIS — R2689 Other abnormalities of gait and mobility: Secondary | ICD-10-CM | POA: Diagnosis not present

## 2018-07-16 DIAGNOSIS — I480 Paroxysmal atrial fibrillation: Secondary | ICD-10-CM | POA: Diagnosis not present

## 2018-07-16 DIAGNOSIS — G309 Alzheimer's disease, unspecified: Secondary | ICD-10-CM | POA: Diagnosis not present

## 2018-07-16 DIAGNOSIS — M1990 Unspecified osteoarthritis, unspecified site: Secondary | ICD-10-CM | POA: Diagnosis not present

## 2018-07-17 DIAGNOSIS — I10 Essential (primary) hypertension: Secondary | ICD-10-CM | POA: Diagnosis not present

## 2018-07-18 DIAGNOSIS — M059 Rheumatoid arthritis with rheumatoid factor, unspecified: Secondary | ICD-10-CM | POA: Diagnosis not present

## 2018-07-18 DIAGNOSIS — M1990 Unspecified osteoarthritis, unspecified site: Secondary | ICD-10-CM | POA: Diagnosis not present

## 2018-07-18 DIAGNOSIS — G309 Alzheimer's disease, unspecified: Secondary | ICD-10-CM | POA: Diagnosis not present

## 2018-07-18 DIAGNOSIS — R2689 Other abnormalities of gait and mobility: Secondary | ICD-10-CM | POA: Diagnosis not present

## 2018-07-18 DIAGNOSIS — I10 Essential (primary) hypertension: Secondary | ICD-10-CM | POA: Diagnosis not present

## 2018-07-18 DIAGNOSIS — J449 Chronic obstructive pulmonary disease, unspecified: Secondary | ICD-10-CM | POA: Diagnosis not present

## 2018-07-18 DIAGNOSIS — R627 Adult failure to thrive: Secondary | ICD-10-CM | POA: Diagnosis not present

## 2018-07-18 DIAGNOSIS — I480 Paroxysmal atrial fibrillation: Secondary | ICD-10-CM | POA: Diagnosis not present

## 2018-07-18 DIAGNOSIS — F0280 Dementia in other diseases classified elsewhere without behavioral disturbance: Secondary | ICD-10-CM | POA: Diagnosis not present

## 2018-07-19 DIAGNOSIS — I10 Essential (primary) hypertension: Secondary | ICD-10-CM | POA: Diagnosis not present

## 2018-07-20 DIAGNOSIS — I10 Essential (primary) hypertension: Secondary | ICD-10-CM | POA: Diagnosis not present

## 2018-07-21 DIAGNOSIS — I10 Essential (primary) hypertension: Secondary | ICD-10-CM | POA: Diagnosis not present

## 2018-07-22 DIAGNOSIS — I10 Essential (primary) hypertension: Secondary | ICD-10-CM | POA: Diagnosis not present

## 2018-07-23 DIAGNOSIS — I10 Essential (primary) hypertension: Secondary | ICD-10-CM | POA: Diagnosis not present

## 2018-07-24 DIAGNOSIS — G309 Alzheimer's disease, unspecified: Secondary | ICD-10-CM | POA: Diagnosis not present

## 2018-07-24 DIAGNOSIS — M059 Rheumatoid arthritis with rheumatoid factor, unspecified: Secondary | ICD-10-CM | POA: Diagnosis not present

## 2018-07-24 DIAGNOSIS — F0280 Dementia in other diseases classified elsewhere without behavioral disturbance: Secondary | ICD-10-CM | POA: Diagnosis not present

## 2018-07-24 DIAGNOSIS — I480 Paroxysmal atrial fibrillation: Secondary | ICD-10-CM | POA: Diagnosis not present

## 2018-07-24 DIAGNOSIS — M1990 Unspecified osteoarthritis, unspecified site: Secondary | ICD-10-CM | POA: Diagnosis not present

## 2018-07-24 DIAGNOSIS — R627 Adult failure to thrive: Secondary | ICD-10-CM | POA: Diagnosis not present

## 2018-07-24 DIAGNOSIS — R2689 Other abnormalities of gait and mobility: Secondary | ICD-10-CM | POA: Diagnosis not present

## 2018-07-24 DIAGNOSIS — I10 Essential (primary) hypertension: Secondary | ICD-10-CM | POA: Diagnosis not present

## 2018-07-24 DIAGNOSIS — J449 Chronic obstructive pulmonary disease, unspecified: Secondary | ICD-10-CM | POA: Diagnosis not present

## 2018-07-25 DIAGNOSIS — I10 Essential (primary) hypertension: Secondary | ICD-10-CM | POA: Diagnosis not present

## 2018-07-26 DIAGNOSIS — G309 Alzheimer's disease, unspecified: Secondary | ICD-10-CM | POA: Diagnosis not present

## 2018-07-26 DIAGNOSIS — I480 Paroxysmal atrial fibrillation: Secondary | ICD-10-CM | POA: Diagnosis not present

## 2018-07-26 DIAGNOSIS — R627 Adult failure to thrive: Secondary | ICD-10-CM | POA: Diagnosis not present

## 2018-07-26 DIAGNOSIS — I10 Essential (primary) hypertension: Secondary | ICD-10-CM | POA: Diagnosis not present

## 2018-07-27 DIAGNOSIS — I10 Essential (primary) hypertension: Secondary | ICD-10-CM | POA: Diagnosis not present

## 2018-07-28 DIAGNOSIS — I10 Essential (primary) hypertension: Secondary | ICD-10-CM | POA: Diagnosis not present

## 2018-07-29 DIAGNOSIS — I10 Essential (primary) hypertension: Secondary | ICD-10-CM | POA: Diagnosis not present

## 2018-07-30 DIAGNOSIS — I10 Essential (primary) hypertension: Secondary | ICD-10-CM | POA: Diagnosis not present

## 2018-07-31 DIAGNOSIS — I10 Essential (primary) hypertension: Secondary | ICD-10-CM | POA: Diagnosis not present

## 2018-07-31 DIAGNOSIS — D518 Other vitamin B12 deficiency anemias: Secondary | ICD-10-CM | POA: Diagnosis not present

## 2018-07-31 DIAGNOSIS — I480 Paroxysmal atrial fibrillation: Secondary | ICD-10-CM | POA: Diagnosis not present

## 2018-07-31 DIAGNOSIS — R627 Adult failure to thrive: Secondary | ICD-10-CM | POA: Diagnosis not present

## 2018-07-31 DIAGNOSIS — G309 Alzheimer's disease, unspecified: Secondary | ICD-10-CM | POA: Diagnosis not present

## 2018-07-31 DIAGNOSIS — E119 Type 2 diabetes mellitus without complications: Secondary | ICD-10-CM | POA: Diagnosis not present

## 2018-07-31 DIAGNOSIS — E038 Other specified hypothyroidism: Secondary | ICD-10-CM | POA: Diagnosis not present

## 2018-08-01 DIAGNOSIS — I10 Essential (primary) hypertension: Secondary | ICD-10-CM | POA: Diagnosis not present

## 2018-08-02 DIAGNOSIS — I10 Essential (primary) hypertension: Secondary | ICD-10-CM | POA: Diagnosis not present

## 2018-08-03 DIAGNOSIS — I10 Essential (primary) hypertension: Secondary | ICD-10-CM | POA: Diagnosis not present

## 2018-08-03 DIAGNOSIS — R627 Adult failure to thrive: Secondary | ICD-10-CM | POA: Diagnosis not present

## 2018-08-03 DIAGNOSIS — I480 Paroxysmal atrial fibrillation: Secondary | ICD-10-CM | POA: Diagnosis not present

## 2018-08-03 DIAGNOSIS — G309 Alzheimer's disease, unspecified: Secondary | ICD-10-CM | POA: Diagnosis not present

## 2018-08-04 DIAGNOSIS — I10 Essential (primary) hypertension: Secondary | ICD-10-CM | POA: Diagnosis not present

## 2018-08-05 DIAGNOSIS — I10 Essential (primary) hypertension: Secondary | ICD-10-CM | POA: Diagnosis not present

## 2018-08-06 DIAGNOSIS — I10 Essential (primary) hypertension: Secondary | ICD-10-CM | POA: Diagnosis not present

## 2018-08-06 DIAGNOSIS — J449 Chronic obstructive pulmonary disease, unspecified: Secondary | ICD-10-CM | POA: Diagnosis not present

## 2018-08-06 DIAGNOSIS — F0280 Dementia in other diseases classified elsewhere without behavioral disturbance: Secondary | ICD-10-CM | POA: Diagnosis not present

## 2018-08-06 DIAGNOSIS — I480 Paroxysmal atrial fibrillation: Secondary | ICD-10-CM | POA: Diagnosis not present

## 2018-08-06 DIAGNOSIS — G309 Alzheimer's disease, unspecified: Secondary | ICD-10-CM | POA: Diagnosis not present

## 2018-08-06 DIAGNOSIS — R2689 Other abnormalities of gait and mobility: Secondary | ICD-10-CM | POA: Diagnosis not present

## 2018-08-06 DIAGNOSIS — M1990 Unspecified osteoarthritis, unspecified site: Secondary | ICD-10-CM | POA: Diagnosis not present

## 2018-08-06 DIAGNOSIS — M059 Rheumatoid arthritis with rheumatoid factor, unspecified: Secondary | ICD-10-CM | POA: Diagnosis not present

## 2018-08-06 DIAGNOSIS — R627 Adult failure to thrive: Secondary | ICD-10-CM | POA: Diagnosis not present

## 2018-08-07 DIAGNOSIS — R627 Adult failure to thrive: Secondary | ICD-10-CM | POA: Diagnosis not present

## 2018-08-07 DIAGNOSIS — M059 Rheumatoid arthritis with rheumatoid factor, unspecified: Secondary | ICD-10-CM | POA: Diagnosis not present

## 2018-08-07 DIAGNOSIS — J449 Chronic obstructive pulmonary disease, unspecified: Secondary | ICD-10-CM | POA: Diagnosis not present

## 2018-08-07 DIAGNOSIS — R2689 Other abnormalities of gait and mobility: Secondary | ICD-10-CM | POA: Diagnosis not present

## 2018-08-07 DIAGNOSIS — I480 Paroxysmal atrial fibrillation: Secondary | ICD-10-CM | POA: Diagnosis not present

## 2018-08-07 DIAGNOSIS — F0280 Dementia in other diseases classified elsewhere without behavioral disturbance: Secondary | ICD-10-CM | POA: Diagnosis not present

## 2018-08-07 DIAGNOSIS — I10 Essential (primary) hypertension: Secondary | ICD-10-CM | POA: Diagnosis not present

## 2018-08-07 DIAGNOSIS — G309 Alzheimer's disease, unspecified: Secondary | ICD-10-CM | POA: Diagnosis not present

## 2018-08-07 DIAGNOSIS — M1990 Unspecified osteoarthritis, unspecified site: Secondary | ICD-10-CM | POA: Diagnosis not present

## 2018-08-08 DIAGNOSIS — I10 Essential (primary) hypertension: Secondary | ICD-10-CM | POA: Diagnosis not present

## 2018-08-09 DIAGNOSIS — F0390 Unspecified dementia without behavioral disturbance: Secondary | ICD-10-CM | POA: Diagnosis not present

## 2018-08-09 DIAGNOSIS — I10 Essential (primary) hypertension: Secondary | ICD-10-CM | POA: Diagnosis not present

## 2018-08-10 DIAGNOSIS — I10 Essential (primary) hypertension: Secondary | ICD-10-CM | POA: Diagnosis not present

## 2018-08-11 DIAGNOSIS — I10 Essential (primary) hypertension: Secondary | ICD-10-CM | POA: Diagnosis not present

## 2018-08-12 DIAGNOSIS — I10 Essential (primary) hypertension: Secondary | ICD-10-CM | POA: Diagnosis not present

## 2018-08-13 DIAGNOSIS — I10 Essential (primary) hypertension: Secondary | ICD-10-CM | POA: Diagnosis not present

## 2018-08-14 DIAGNOSIS — I10 Essential (primary) hypertension: Secondary | ICD-10-CM | POA: Diagnosis not present

## 2018-08-15 DIAGNOSIS — R627 Adult failure to thrive: Secondary | ICD-10-CM | POA: Diagnosis not present

## 2018-08-15 DIAGNOSIS — G309 Alzheimer's disease, unspecified: Secondary | ICD-10-CM | POA: Diagnosis not present

## 2018-08-15 DIAGNOSIS — M059 Rheumatoid arthritis with rheumatoid factor, unspecified: Secondary | ICD-10-CM | POA: Diagnosis not present

## 2018-08-15 DIAGNOSIS — I10 Essential (primary) hypertension: Secondary | ICD-10-CM | POA: Diagnosis not present

## 2018-08-15 DIAGNOSIS — J449 Chronic obstructive pulmonary disease, unspecified: Secondary | ICD-10-CM | POA: Diagnosis not present

## 2018-08-15 DIAGNOSIS — I480 Paroxysmal atrial fibrillation: Secondary | ICD-10-CM | POA: Diagnosis not present

## 2018-08-15 DIAGNOSIS — M1990 Unspecified osteoarthritis, unspecified site: Secondary | ICD-10-CM | POA: Diagnosis not present

## 2018-08-15 DIAGNOSIS — F0280 Dementia in other diseases classified elsewhere without behavioral disturbance: Secondary | ICD-10-CM | POA: Diagnosis not present

## 2018-08-15 DIAGNOSIS — R2689 Other abnormalities of gait and mobility: Secondary | ICD-10-CM | POA: Diagnosis not present

## 2018-08-16 DIAGNOSIS — I10 Essential (primary) hypertension: Secondary | ICD-10-CM | POA: Diagnosis not present

## 2018-08-17 DIAGNOSIS — I10 Essential (primary) hypertension: Secondary | ICD-10-CM | POA: Diagnosis not present

## 2018-08-18 DIAGNOSIS — I10 Essential (primary) hypertension: Secondary | ICD-10-CM | POA: Diagnosis not present

## 2018-08-19 DIAGNOSIS — I10 Essential (primary) hypertension: Secondary | ICD-10-CM | POA: Diagnosis not present

## 2018-08-20 DIAGNOSIS — I10 Essential (primary) hypertension: Secondary | ICD-10-CM | POA: Diagnosis not present

## 2018-08-21 DIAGNOSIS — G309 Alzheimer's disease, unspecified: Secondary | ICD-10-CM | POA: Diagnosis not present

## 2018-08-21 DIAGNOSIS — J449 Chronic obstructive pulmonary disease, unspecified: Secondary | ICD-10-CM | POA: Diagnosis not present

## 2018-08-21 DIAGNOSIS — I480 Paroxysmal atrial fibrillation: Secondary | ICD-10-CM | POA: Diagnosis not present

## 2018-08-21 DIAGNOSIS — M1990 Unspecified osteoarthritis, unspecified site: Secondary | ICD-10-CM | POA: Diagnosis not present

## 2018-08-21 DIAGNOSIS — M059 Rheumatoid arthritis with rheumatoid factor, unspecified: Secondary | ICD-10-CM | POA: Diagnosis not present

## 2018-08-21 DIAGNOSIS — I10 Essential (primary) hypertension: Secondary | ICD-10-CM | POA: Diagnosis not present

## 2018-08-21 DIAGNOSIS — R2689 Other abnormalities of gait and mobility: Secondary | ICD-10-CM | POA: Diagnosis not present

## 2018-08-21 DIAGNOSIS — F0280 Dementia in other diseases classified elsewhere without behavioral disturbance: Secondary | ICD-10-CM | POA: Diagnosis not present

## 2018-08-21 DIAGNOSIS — R627 Adult failure to thrive: Secondary | ICD-10-CM | POA: Diagnosis not present

## 2018-08-22 DIAGNOSIS — I10 Essential (primary) hypertension: Secondary | ICD-10-CM | POA: Diagnosis not present

## 2018-08-23 DIAGNOSIS — I10 Essential (primary) hypertension: Secondary | ICD-10-CM | POA: Diagnosis not present

## 2018-08-24 DIAGNOSIS — I10 Essential (primary) hypertension: Secondary | ICD-10-CM | POA: Diagnosis not present

## 2018-08-25 DIAGNOSIS — I10 Essential (primary) hypertension: Secondary | ICD-10-CM | POA: Diagnosis not present

## 2018-08-26 DIAGNOSIS — I10 Essential (primary) hypertension: Secondary | ICD-10-CM | POA: Diagnosis not present

## 2018-08-27 DIAGNOSIS — I10 Essential (primary) hypertension: Secondary | ICD-10-CM | POA: Diagnosis not present

## 2018-08-28 DIAGNOSIS — I10 Essential (primary) hypertension: Secondary | ICD-10-CM | POA: Diagnosis not present

## 2018-08-29 DIAGNOSIS — I10 Essential (primary) hypertension: Secondary | ICD-10-CM | POA: Diagnosis not present

## 2018-08-31 DIAGNOSIS — E038 Other specified hypothyroidism: Secondary | ICD-10-CM | POA: Diagnosis not present

## 2018-08-31 DIAGNOSIS — E119 Type 2 diabetes mellitus without complications: Secondary | ICD-10-CM | POA: Diagnosis not present

## 2018-08-31 DIAGNOSIS — D518 Other vitamin B12 deficiency anemias: Secondary | ICD-10-CM | POA: Diagnosis not present

## 2018-09-04 DIAGNOSIS — R300 Dysuria: Secondary | ICD-10-CM | POA: Diagnosis not present

## 2018-09-04 DIAGNOSIS — N390 Urinary tract infection, site not specified: Secondary | ICD-10-CM | POA: Diagnosis not present

## 2018-09-04 DIAGNOSIS — A64 Unspecified sexually transmitted disease: Secondary | ICD-10-CM | POA: Diagnosis not present

## 2018-09-06 DIAGNOSIS — I10 Essential (primary) hypertension: Secondary | ICD-10-CM | POA: Diagnosis not present

## 2018-09-07 DIAGNOSIS — I10 Essential (primary) hypertension: Secondary | ICD-10-CM | POA: Diagnosis not present

## 2018-09-08 DIAGNOSIS — I10 Essential (primary) hypertension: Secondary | ICD-10-CM | POA: Diagnosis not present

## 2018-09-09 DIAGNOSIS — I10 Essential (primary) hypertension: Secondary | ICD-10-CM | POA: Diagnosis not present

## 2018-09-10 DIAGNOSIS — I10 Essential (primary) hypertension: Secondary | ICD-10-CM | POA: Diagnosis not present

## 2018-09-11 DIAGNOSIS — F0390 Unspecified dementia without behavioral disturbance: Secondary | ICD-10-CM | POA: Diagnosis not present

## 2018-09-11 DIAGNOSIS — I10 Essential (primary) hypertension: Secondary | ICD-10-CM | POA: Diagnosis not present

## 2018-09-11 DIAGNOSIS — I480 Paroxysmal atrial fibrillation: Secondary | ICD-10-CM | POA: Diagnosis not present

## 2018-09-11 DIAGNOSIS — M069 Rheumatoid arthritis, unspecified: Secondary | ICD-10-CM | POA: Diagnosis not present

## 2018-09-12 DIAGNOSIS — D518 Other vitamin B12 deficiency anemias: Secondary | ICD-10-CM | POA: Diagnosis not present

## 2018-09-12 DIAGNOSIS — F0390 Unspecified dementia without behavioral disturbance: Secondary | ICD-10-CM | POA: Diagnosis not present

## 2018-09-12 DIAGNOSIS — Z79899 Other long term (current) drug therapy: Secondary | ICD-10-CM | POA: Diagnosis not present

## 2018-09-12 DIAGNOSIS — E7849 Other hyperlipidemia: Secondary | ICD-10-CM | POA: Diagnosis not present

## 2018-09-12 DIAGNOSIS — E119 Type 2 diabetes mellitus without complications: Secondary | ICD-10-CM | POA: Diagnosis not present

## 2018-09-12 DIAGNOSIS — G4700 Insomnia, unspecified: Secondary | ICD-10-CM | POA: Diagnosis not present

## 2018-09-12 DIAGNOSIS — E038 Other specified hypothyroidism: Secondary | ICD-10-CM | POA: Diagnosis not present

## 2018-09-12 DIAGNOSIS — E559 Vitamin D deficiency, unspecified: Secondary | ICD-10-CM | POA: Diagnosis not present

## 2018-09-12 DIAGNOSIS — F329 Major depressive disorder, single episode, unspecified: Secondary | ICD-10-CM | POA: Diagnosis not present

## 2018-09-12 DIAGNOSIS — I10 Essential (primary) hypertension: Secondary | ICD-10-CM | POA: Diagnosis not present

## 2018-09-13 DIAGNOSIS — I10 Essential (primary) hypertension: Secondary | ICD-10-CM | POA: Diagnosis not present

## 2018-09-14 DIAGNOSIS — I10 Essential (primary) hypertension: Secondary | ICD-10-CM | POA: Diagnosis not present

## 2018-09-15 DIAGNOSIS — I10 Essential (primary) hypertension: Secondary | ICD-10-CM | POA: Diagnosis not present

## 2018-09-16 DIAGNOSIS — I10 Essential (primary) hypertension: Secondary | ICD-10-CM | POA: Diagnosis not present

## 2018-09-17 DIAGNOSIS — I10 Essential (primary) hypertension: Secondary | ICD-10-CM | POA: Diagnosis not present

## 2018-09-18 DIAGNOSIS — I10 Essential (primary) hypertension: Secondary | ICD-10-CM | POA: Diagnosis not present

## 2018-09-19 DIAGNOSIS — I10 Essential (primary) hypertension: Secondary | ICD-10-CM | POA: Diagnosis not present

## 2018-09-20 DIAGNOSIS — I10 Essential (primary) hypertension: Secondary | ICD-10-CM | POA: Diagnosis not present

## 2018-09-21 DIAGNOSIS — I10 Essential (primary) hypertension: Secondary | ICD-10-CM | POA: Diagnosis not present

## 2018-09-22 DIAGNOSIS — I10 Essential (primary) hypertension: Secondary | ICD-10-CM | POA: Diagnosis not present

## 2018-09-23 DIAGNOSIS — I10 Essential (primary) hypertension: Secondary | ICD-10-CM | POA: Diagnosis not present

## 2018-09-24 DIAGNOSIS — I10 Essential (primary) hypertension: Secondary | ICD-10-CM | POA: Diagnosis not present

## 2018-09-25 DIAGNOSIS — I10 Essential (primary) hypertension: Secondary | ICD-10-CM | POA: Diagnosis not present

## 2018-09-26 DIAGNOSIS — I10 Essential (primary) hypertension: Secondary | ICD-10-CM | POA: Diagnosis not present

## 2018-09-27 DIAGNOSIS — I10 Essential (primary) hypertension: Secondary | ICD-10-CM | POA: Diagnosis not present

## 2018-09-28 DIAGNOSIS — B351 Tinea unguium: Secondary | ICD-10-CM | POA: Diagnosis not present

## 2018-09-28 DIAGNOSIS — I10 Essential (primary) hypertension: Secondary | ICD-10-CM | POA: Diagnosis not present

## 2018-09-29 DIAGNOSIS — I10 Essential (primary) hypertension: Secondary | ICD-10-CM | POA: Diagnosis not present

## 2018-09-30 DIAGNOSIS — I10 Essential (primary) hypertension: Secondary | ICD-10-CM | POA: Diagnosis not present

## 2018-10-01 DIAGNOSIS — I10 Essential (primary) hypertension: Secondary | ICD-10-CM | POA: Diagnosis not present

## 2018-10-02 DIAGNOSIS — I10 Essential (primary) hypertension: Secondary | ICD-10-CM | POA: Diagnosis not present

## 2018-10-03 DIAGNOSIS — I10 Essential (primary) hypertension: Secondary | ICD-10-CM | POA: Diagnosis not present

## 2018-10-04 DIAGNOSIS — I10 Essential (primary) hypertension: Secondary | ICD-10-CM | POA: Diagnosis not present

## 2018-10-05 DIAGNOSIS — I10 Essential (primary) hypertension: Secondary | ICD-10-CM | POA: Diagnosis not present

## 2018-10-06 DIAGNOSIS — I10 Essential (primary) hypertension: Secondary | ICD-10-CM | POA: Diagnosis not present

## 2018-10-07 DIAGNOSIS — I10 Essential (primary) hypertension: Secondary | ICD-10-CM | POA: Diagnosis not present

## 2018-10-08 DIAGNOSIS — I10 Essential (primary) hypertension: Secondary | ICD-10-CM | POA: Diagnosis not present

## 2018-10-09 DIAGNOSIS — E119 Type 2 diabetes mellitus without complications: Secondary | ICD-10-CM | POA: Diagnosis not present

## 2018-10-09 DIAGNOSIS — I10 Essential (primary) hypertension: Secondary | ICD-10-CM | POA: Diagnosis not present

## 2018-10-09 DIAGNOSIS — I480 Paroxysmal atrial fibrillation: Secondary | ICD-10-CM | POA: Diagnosis not present

## 2018-10-09 DIAGNOSIS — D518 Other vitamin B12 deficiency anemias: Secondary | ICD-10-CM | POA: Diagnosis not present

## 2018-10-09 DIAGNOSIS — I739 Peripheral vascular disease, unspecified: Secondary | ICD-10-CM | POA: Diagnosis not present

## 2018-10-09 DIAGNOSIS — E038 Other specified hypothyroidism: Secondary | ICD-10-CM | POA: Diagnosis not present

## 2018-10-09 DIAGNOSIS — M069 Rheumatoid arthritis, unspecified: Secondary | ICD-10-CM | POA: Diagnosis not present

## 2018-10-10 DIAGNOSIS — I10 Essential (primary) hypertension: Secondary | ICD-10-CM | POA: Diagnosis not present

## 2018-10-11 DIAGNOSIS — I10 Essential (primary) hypertension: Secondary | ICD-10-CM | POA: Diagnosis not present

## 2018-10-12 DIAGNOSIS — I10 Essential (primary) hypertension: Secondary | ICD-10-CM | POA: Diagnosis not present

## 2018-10-13 DIAGNOSIS — I10 Essential (primary) hypertension: Secondary | ICD-10-CM | POA: Diagnosis not present

## 2018-10-14 DIAGNOSIS — I10 Essential (primary) hypertension: Secondary | ICD-10-CM | POA: Diagnosis not present

## 2018-10-15 DIAGNOSIS — I10 Essential (primary) hypertension: Secondary | ICD-10-CM | POA: Diagnosis not present

## 2018-10-16 DIAGNOSIS — I10 Essential (primary) hypertension: Secondary | ICD-10-CM | POA: Diagnosis not present

## 2018-10-17 DIAGNOSIS — I10 Essential (primary) hypertension: Secondary | ICD-10-CM | POA: Diagnosis not present

## 2018-10-18 DIAGNOSIS — I10 Essential (primary) hypertension: Secondary | ICD-10-CM | POA: Diagnosis not present

## 2018-10-19 DIAGNOSIS — G4700 Insomnia, unspecified: Secondary | ICD-10-CM | POA: Diagnosis not present

## 2018-10-19 DIAGNOSIS — F329 Major depressive disorder, single episode, unspecified: Secondary | ICD-10-CM | POA: Diagnosis not present

## 2018-10-19 DIAGNOSIS — F0390 Unspecified dementia without behavioral disturbance: Secondary | ICD-10-CM | POA: Diagnosis not present

## 2018-10-30 DIAGNOSIS — E7849 Other hyperlipidemia: Secondary | ICD-10-CM | POA: Diagnosis not present

## 2018-10-30 DIAGNOSIS — D518 Other vitamin B12 deficiency anemias: Secondary | ICD-10-CM | POA: Diagnosis not present

## 2018-10-30 DIAGNOSIS — Z79899 Other long term (current) drug therapy: Secondary | ICD-10-CM | POA: Diagnosis not present

## 2018-10-30 DIAGNOSIS — E119 Type 2 diabetes mellitus without complications: Secondary | ICD-10-CM | POA: Diagnosis not present

## 2018-10-30 DIAGNOSIS — E038 Other specified hypothyroidism: Secondary | ICD-10-CM | POA: Diagnosis not present

## 2018-11-01 DIAGNOSIS — I10 Essential (primary) hypertension: Secondary | ICD-10-CM | POA: Diagnosis not present

## 2018-11-01 DIAGNOSIS — Z79899 Other long term (current) drug therapy: Secondary | ICD-10-CM | POA: Diagnosis not present

## 2018-11-02 DIAGNOSIS — I10 Essential (primary) hypertension: Secondary | ICD-10-CM | POA: Diagnosis not present

## 2018-11-03 DIAGNOSIS — I10 Essential (primary) hypertension: Secondary | ICD-10-CM | POA: Diagnosis not present

## 2018-11-04 DIAGNOSIS — I10 Essential (primary) hypertension: Secondary | ICD-10-CM | POA: Diagnosis not present

## 2018-11-05 DIAGNOSIS — I10 Essential (primary) hypertension: Secondary | ICD-10-CM | POA: Diagnosis not present

## 2018-11-06 DIAGNOSIS — I739 Peripheral vascular disease, unspecified: Secondary | ICD-10-CM | POA: Diagnosis not present

## 2018-11-06 DIAGNOSIS — I10 Essential (primary) hypertension: Secondary | ICD-10-CM | POA: Diagnosis not present

## 2018-11-06 DIAGNOSIS — M069 Rheumatoid arthritis, unspecified: Secondary | ICD-10-CM | POA: Diagnosis not present

## 2018-11-06 DIAGNOSIS — I480 Paroxysmal atrial fibrillation: Secondary | ICD-10-CM | POA: Diagnosis not present

## 2018-11-07 DIAGNOSIS — I10 Essential (primary) hypertension: Secondary | ICD-10-CM | POA: Diagnosis not present

## 2018-11-08 DIAGNOSIS — I10 Essential (primary) hypertension: Secondary | ICD-10-CM | POA: Diagnosis not present

## 2018-11-09 DIAGNOSIS — I10 Essential (primary) hypertension: Secondary | ICD-10-CM | POA: Diagnosis not present

## 2018-11-10 DIAGNOSIS — I10 Essential (primary) hypertension: Secondary | ICD-10-CM | POA: Diagnosis not present

## 2018-11-11 DIAGNOSIS — I10 Essential (primary) hypertension: Secondary | ICD-10-CM | POA: Diagnosis not present

## 2018-11-12 DIAGNOSIS — I10 Essential (primary) hypertension: Secondary | ICD-10-CM | POA: Diagnosis not present

## 2018-11-13 DIAGNOSIS — I10 Essential (primary) hypertension: Secondary | ICD-10-CM | POA: Diagnosis not present

## 2018-11-14 DIAGNOSIS — I10 Essential (primary) hypertension: Secondary | ICD-10-CM | POA: Diagnosis not present

## 2018-11-15 DIAGNOSIS — I10 Essential (primary) hypertension: Secondary | ICD-10-CM | POA: Diagnosis not present

## 2018-11-16 DIAGNOSIS — I10 Essential (primary) hypertension: Secondary | ICD-10-CM | POA: Diagnosis not present

## 2018-11-17 DIAGNOSIS — I10 Essential (primary) hypertension: Secondary | ICD-10-CM | POA: Diagnosis not present

## 2018-11-20 DIAGNOSIS — G250 Essential tremor: Secondary | ICD-10-CM | POA: Diagnosis not present

## 2018-11-20 DIAGNOSIS — M0579 Rheumatoid arthritis with rheumatoid factor of multiple sites without organ or systems involvement: Secondary | ICD-10-CM | POA: Diagnosis not present

## 2018-11-20 DIAGNOSIS — M810 Age-related osteoporosis without current pathological fracture: Secondary | ICD-10-CM | POA: Diagnosis not present

## 2018-11-29 DIAGNOSIS — I10 Essential (primary) hypertension: Secondary | ICD-10-CM | POA: Diagnosis not present

## 2018-11-30 DIAGNOSIS — I10 Essential (primary) hypertension: Secondary | ICD-10-CM | POA: Diagnosis not present

## 2018-12-01 DIAGNOSIS — I10 Essential (primary) hypertension: Secondary | ICD-10-CM | POA: Diagnosis not present

## 2018-12-02 DIAGNOSIS — I10 Essential (primary) hypertension: Secondary | ICD-10-CM | POA: Diagnosis not present

## 2018-12-03 DIAGNOSIS — I10 Essential (primary) hypertension: Secondary | ICD-10-CM | POA: Diagnosis not present

## 2018-12-04 DIAGNOSIS — M069 Rheumatoid arthritis, unspecified: Secondary | ICD-10-CM | POA: Diagnosis not present

## 2018-12-04 DIAGNOSIS — I739 Peripheral vascular disease, unspecified: Secondary | ICD-10-CM | POA: Diagnosis not present

## 2018-12-04 DIAGNOSIS — I10 Essential (primary) hypertension: Secondary | ICD-10-CM | POA: Diagnosis not present

## 2018-12-04 DIAGNOSIS — I480 Paroxysmal atrial fibrillation: Secondary | ICD-10-CM | POA: Diagnosis not present

## 2018-12-05 DIAGNOSIS — I10 Essential (primary) hypertension: Secondary | ICD-10-CM | POA: Diagnosis not present

## 2018-12-06 DIAGNOSIS — E559 Vitamin D deficiency, unspecified: Secondary | ICD-10-CM | POA: Diagnosis not present

## 2018-12-06 DIAGNOSIS — D518 Other vitamin B12 deficiency anemias: Secondary | ICD-10-CM | POA: Diagnosis not present

## 2018-12-06 DIAGNOSIS — E7849 Other hyperlipidemia: Secondary | ICD-10-CM | POA: Diagnosis not present

## 2018-12-06 DIAGNOSIS — Z79899 Other long term (current) drug therapy: Secondary | ICD-10-CM | POA: Diagnosis not present

## 2018-12-06 DIAGNOSIS — E038 Other specified hypothyroidism: Secondary | ICD-10-CM | POA: Diagnosis not present

## 2018-12-06 DIAGNOSIS — E119 Type 2 diabetes mellitus without complications: Secondary | ICD-10-CM | POA: Diagnosis not present

## 2018-12-06 DIAGNOSIS — I10 Essential (primary) hypertension: Secondary | ICD-10-CM | POA: Diagnosis not present

## 2018-12-07 DIAGNOSIS — I10 Essential (primary) hypertension: Secondary | ICD-10-CM | POA: Diagnosis not present

## 2018-12-08 DIAGNOSIS — I10 Essential (primary) hypertension: Secondary | ICD-10-CM | POA: Diagnosis not present

## 2018-12-09 DIAGNOSIS — I10 Essential (primary) hypertension: Secondary | ICD-10-CM | POA: Diagnosis not present

## 2018-12-10 DIAGNOSIS — I10 Essential (primary) hypertension: Secondary | ICD-10-CM | POA: Diagnosis not present

## 2018-12-11 DIAGNOSIS — I10 Essential (primary) hypertension: Secondary | ICD-10-CM | POA: Diagnosis not present

## 2018-12-12 DIAGNOSIS — I10 Essential (primary) hypertension: Secondary | ICD-10-CM | POA: Diagnosis not present

## 2018-12-13 DIAGNOSIS — I10 Essential (primary) hypertension: Secondary | ICD-10-CM | POA: Diagnosis not present

## 2018-12-14 DIAGNOSIS — I10 Essential (primary) hypertension: Secondary | ICD-10-CM | POA: Diagnosis not present

## 2018-12-15 DIAGNOSIS — I10 Essential (primary) hypertension: Secondary | ICD-10-CM | POA: Diagnosis not present

## 2018-12-16 DIAGNOSIS — I10 Essential (primary) hypertension: Secondary | ICD-10-CM | POA: Diagnosis not present

## 2018-12-17 DIAGNOSIS — I10 Essential (primary) hypertension: Secondary | ICD-10-CM | POA: Diagnosis not present

## 2018-12-18 DIAGNOSIS — I10 Essential (primary) hypertension: Secondary | ICD-10-CM | POA: Diagnosis not present

## 2018-12-19 DIAGNOSIS — I10 Essential (primary) hypertension: Secondary | ICD-10-CM | POA: Diagnosis not present

## 2018-12-20 DIAGNOSIS — I10 Essential (primary) hypertension: Secondary | ICD-10-CM | POA: Diagnosis not present

## 2018-12-21 DIAGNOSIS — I10 Essential (primary) hypertension: Secondary | ICD-10-CM | POA: Diagnosis not present

## 2018-12-22 DIAGNOSIS — I10 Essential (primary) hypertension: Secondary | ICD-10-CM | POA: Diagnosis not present

## 2018-12-23 DIAGNOSIS — I10 Essential (primary) hypertension: Secondary | ICD-10-CM | POA: Diagnosis not present

## 2018-12-24 DIAGNOSIS — I10 Essential (primary) hypertension: Secondary | ICD-10-CM | POA: Diagnosis not present

## 2018-12-25 DIAGNOSIS — I10 Essential (primary) hypertension: Secondary | ICD-10-CM | POA: Diagnosis not present

## 2018-12-26 DIAGNOSIS — I10 Essential (primary) hypertension: Secondary | ICD-10-CM | POA: Diagnosis not present

## 2018-12-27 DIAGNOSIS — I10 Essential (primary) hypertension: Secondary | ICD-10-CM | POA: Diagnosis not present

## 2018-12-28 DIAGNOSIS — I10 Essential (primary) hypertension: Secondary | ICD-10-CM | POA: Diagnosis not present

## 2018-12-29 DIAGNOSIS — I10 Essential (primary) hypertension: Secondary | ICD-10-CM | POA: Diagnosis not present

## 2018-12-30 DIAGNOSIS — I10 Essential (primary) hypertension: Secondary | ICD-10-CM | POA: Diagnosis not present

## 2018-12-31 DIAGNOSIS — I10 Essential (primary) hypertension: Secondary | ICD-10-CM | POA: Diagnosis not present

## 2019-01-01 DIAGNOSIS — I10 Essential (primary) hypertension: Secondary | ICD-10-CM | POA: Diagnosis not present

## 2019-01-02 DIAGNOSIS — I10 Essential (primary) hypertension: Secondary | ICD-10-CM | POA: Diagnosis not present

## 2019-01-03 DIAGNOSIS — I10 Essential (primary) hypertension: Secondary | ICD-10-CM | POA: Diagnosis not present

## 2019-01-04 DIAGNOSIS — I10 Essential (primary) hypertension: Secondary | ICD-10-CM | POA: Diagnosis not present

## 2019-01-05 DIAGNOSIS — I10 Essential (primary) hypertension: Secondary | ICD-10-CM | POA: Diagnosis not present

## 2019-01-06 DIAGNOSIS — I10 Essential (primary) hypertension: Secondary | ICD-10-CM | POA: Diagnosis not present

## 2019-01-07 DIAGNOSIS — I10 Essential (primary) hypertension: Secondary | ICD-10-CM | POA: Diagnosis not present

## 2019-01-08 DIAGNOSIS — I10 Essential (primary) hypertension: Secondary | ICD-10-CM | POA: Diagnosis not present

## 2019-01-09 DIAGNOSIS — I10 Essential (primary) hypertension: Secondary | ICD-10-CM | POA: Diagnosis not present

## 2019-01-17 DIAGNOSIS — I10 Essential (primary) hypertension: Secondary | ICD-10-CM | POA: Diagnosis not present

## 2019-01-17 DIAGNOSIS — F329 Major depressive disorder, single episode, unspecified: Secondary | ICD-10-CM | POA: Diagnosis not present

## 2019-01-18 DIAGNOSIS — I10 Essential (primary) hypertension: Secondary | ICD-10-CM | POA: Diagnosis not present

## 2019-01-19 DIAGNOSIS — I10 Essential (primary) hypertension: Secondary | ICD-10-CM | POA: Diagnosis not present

## 2019-01-20 DIAGNOSIS — I10 Essential (primary) hypertension: Secondary | ICD-10-CM | POA: Diagnosis not present

## 2019-01-21 DIAGNOSIS — I10 Essential (primary) hypertension: Secondary | ICD-10-CM | POA: Diagnosis not present

## 2019-01-22 DIAGNOSIS — I10 Essential (primary) hypertension: Secondary | ICD-10-CM | POA: Diagnosis not present

## 2019-01-23 DIAGNOSIS — I10 Essential (primary) hypertension: Secondary | ICD-10-CM | POA: Diagnosis not present

## 2019-01-24 DIAGNOSIS — I10 Essential (primary) hypertension: Secondary | ICD-10-CM | POA: Diagnosis not present

## 2019-01-25 DIAGNOSIS — I10 Essential (primary) hypertension: Secondary | ICD-10-CM | POA: Diagnosis not present

## 2019-01-26 DIAGNOSIS — I10 Essential (primary) hypertension: Secondary | ICD-10-CM | POA: Diagnosis not present

## 2019-01-27 DIAGNOSIS — I10 Essential (primary) hypertension: Secondary | ICD-10-CM | POA: Diagnosis not present

## 2019-01-28 DIAGNOSIS — I10 Essential (primary) hypertension: Secondary | ICD-10-CM | POA: Diagnosis not present

## 2019-01-29 DIAGNOSIS — I10 Essential (primary) hypertension: Secondary | ICD-10-CM | POA: Diagnosis not present

## 2019-01-29 DIAGNOSIS — I739 Peripheral vascular disease, unspecified: Secondary | ICD-10-CM | POA: Diagnosis not present

## 2019-01-29 DIAGNOSIS — M069 Rheumatoid arthritis, unspecified: Secondary | ICD-10-CM | POA: Diagnosis not present

## 2019-01-29 DIAGNOSIS — F0390 Unspecified dementia without behavioral disturbance: Secondary | ICD-10-CM | POA: Diagnosis not present

## 2019-01-29 DIAGNOSIS — I480 Paroxysmal atrial fibrillation: Secondary | ICD-10-CM | POA: Diagnosis not present

## 2019-01-30 DIAGNOSIS — I10 Essential (primary) hypertension: Secondary | ICD-10-CM | POA: Diagnosis not present

## 2019-01-31 DIAGNOSIS — I10 Essential (primary) hypertension: Secondary | ICD-10-CM | POA: Diagnosis not present

## 2019-02-01 DIAGNOSIS — I739 Peripheral vascular disease, unspecified: Secondary | ICD-10-CM | POA: Diagnosis not present

## 2019-02-01 DIAGNOSIS — B351 Tinea unguium: Secondary | ICD-10-CM | POA: Diagnosis not present

## 2019-02-01 DIAGNOSIS — I10 Essential (primary) hypertension: Secondary | ICD-10-CM | POA: Diagnosis not present

## 2019-02-02 DIAGNOSIS — I10 Essential (primary) hypertension: Secondary | ICD-10-CM | POA: Diagnosis not present

## 2019-02-03 DIAGNOSIS — I10 Essential (primary) hypertension: Secondary | ICD-10-CM | POA: Diagnosis not present

## 2019-02-04 DIAGNOSIS — I10 Essential (primary) hypertension: Secondary | ICD-10-CM | POA: Diagnosis not present

## 2019-02-05 DIAGNOSIS — I10 Essential (primary) hypertension: Secondary | ICD-10-CM | POA: Diagnosis not present

## 2019-02-06 DIAGNOSIS — I10 Essential (primary) hypertension: Secondary | ICD-10-CM | POA: Diagnosis not present

## 2019-02-07 DIAGNOSIS — I10 Essential (primary) hypertension: Secondary | ICD-10-CM | POA: Diagnosis not present

## 2019-02-08 DIAGNOSIS — I10 Essential (primary) hypertension: Secondary | ICD-10-CM | POA: Diagnosis not present

## 2019-02-09 DIAGNOSIS — I10 Essential (primary) hypertension: Secondary | ICD-10-CM | POA: Diagnosis not present

## 2019-02-10 DIAGNOSIS — I10 Essential (primary) hypertension: Secondary | ICD-10-CM | POA: Diagnosis not present

## 2019-02-11 DIAGNOSIS — I10 Essential (primary) hypertension: Secondary | ICD-10-CM | POA: Diagnosis not present

## 2019-02-12 DIAGNOSIS — I10 Essential (primary) hypertension: Secondary | ICD-10-CM | POA: Diagnosis not present

## 2019-02-13 DIAGNOSIS — I10 Essential (primary) hypertension: Secondary | ICD-10-CM | POA: Diagnosis not present

## 2019-02-14 DIAGNOSIS — I10 Essential (primary) hypertension: Secondary | ICD-10-CM | POA: Diagnosis not present

## 2019-02-15 DIAGNOSIS — I10 Essential (primary) hypertension: Secondary | ICD-10-CM | POA: Diagnosis not present

## 2019-02-15 DIAGNOSIS — E119 Type 2 diabetes mellitus without complications: Secondary | ICD-10-CM | POA: Diagnosis not present

## 2019-02-15 DIAGNOSIS — D518 Other vitamin B12 deficiency anemias: Secondary | ICD-10-CM | POA: Diagnosis not present

## 2019-02-15 DIAGNOSIS — E038 Other specified hypothyroidism: Secondary | ICD-10-CM | POA: Diagnosis not present

## 2019-02-16 DIAGNOSIS — I10 Essential (primary) hypertension: Secondary | ICD-10-CM | POA: Diagnosis not present

## 2019-02-17 DIAGNOSIS — I10 Essential (primary) hypertension: Secondary | ICD-10-CM | POA: Diagnosis not present

## 2019-02-18 DIAGNOSIS — I10 Essential (primary) hypertension: Secondary | ICD-10-CM | POA: Diagnosis not present

## 2019-02-19 DIAGNOSIS — I10 Essential (primary) hypertension: Secondary | ICD-10-CM | POA: Diagnosis not present

## 2019-02-20 DIAGNOSIS — I10 Essential (primary) hypertension: Secondary | ICD-10-CM | POA: Diagnosis not present

## 2019-02-21 DIAGNOSIS — I10 Essential (primary) hypertension: Secondary | ICD-10-CM | POA: Diagnosis not present

## 2019-02-22 DIAGNOSIS — I10 Essential (primary) hypertension: Secondary | ICD-10-CM | POA: Diagnosis not present

## 2019-02-23 DIAGNOSIS — I10 Essential (primary) hypertension: Secondary | ICD-10-CM | POA: Diagnosis not present

## 2019-02-24 DIAGNOSIS — I10 Essential (primary) hypertension: Secondary | ICD-10-CM | POA: Diagnosis not present

## 2019-02-25 DIAGNOSIS — I10 Essential (primary) hypertension: Secondary | ICD-10-CM | POA: Diagnosis not present

## 2019-02-26 DIAGNOSIS — I10 Essential (primary) hypertension: Secondary | ICD-10-CM | POA: Diagnosis not present

## 2019-02-27 DIAGNOSIS — I10 Essential (primary) hypertension: Secondary | ICD-10-CM | POA: Diagnosis not present

## 2019-02-28 DIAGNOSIS — F419 Anxiety disorder, unspecified: Secondary | ICD-10-CM | POA: Diagnosis not present

## 2019-02-28 DIAGNOSIS — F0390 Unspecified dementia without behavioral disturbance: Secondary | ICD-10-CM | POA: Diagnosis not present

## 2019-02-28 DIAGNOSIS — I10 Essential (primary) hypertension: Secondary | ICD-10-CM | POA: Diagnosis not present

## 2019-03-01 DIAGNOSIS — I10 Essential (primary) hypertension: Secondary | ICD-10-CM | POA: Diagnosis not present

## 2019-03-02 DIAGNOSIS — I10 Essential (primary) hypertension: Secondary | ICD-10-CM | POA: Diagnosis not present

## 2019-03-03 DIAGNOSIS — I10 Essential (primary) hypertension: Secondary | ICD-10-CM | POA: Diagnosis not present

## 2019-03-04 DIAGNOSIS — I10 Essential (primary) hypertension: Secondary | ICD-10-CM | POA: Diagnosis not present

## 2019-03-05 DIAGNOSIS — I10 Essential (primary) hypertension: Secondary | ICD-10-CM | POA: Diagnosis not present

## 2019-03-06 DIAGNOSIS — I10 Essential (primary) hypertension: Secondary | ICD-10-CM | POA: Diagnosis not present

## 2019-03-07 DIAGNOSIS — I10 Essential (primary) hypertension: Secondary | ICD-10-CM | POA: Diagnosis not present

## 2019-03-08 DIAGNOSIS — I10 Essential (primary) hypertension: Secondary | ICD-10-CM | POA: Diagnosis not present

## 2019-03-09 DIAGNOSIS — I10 Essential (primary) hypertension: Secondary | ICD-10-CM | POA: Diagnosis not present

## 2019-03-10 DIAGNOSIS — I10 Essential (primary) hypertension: Secondary | ICD-10-CM | POA: Diagnosis not present

## 2019-03-11 DIAGNOSIS — I10 Essential (primary) hypertension: Secondary | ICD-10-CM | POA: Diagnosis not present

## 2019-03-12 DIAGNOSIS — I10 Essential (primary) hypertension: Secondary | ICD-10-CM | POA: Diagnosis not present

## 2019-03-13 DIAGNOSIS — I10 Essential (primary) hypertension: Secondary | ICD-10-CM | POA: Diagnosis not present

## 2019-03-15 DIAGNOSIS — N390 Urinary tract infection, site not specified: Secondary | ICD-10-CM | POA: Diagnosis not present

## 2019-03-18 DIAGNOSIS — E119 Type 2 diabetes mellitus without complications: Secondary | ICD-10-CM | POA: Diagnosis not present

## 2019-03-18 DIAGNOSIS — E038 Other specified hypothyroidism: Secondary | ICD-10-CM | POA: Diagnosis not present

## 2019-03-18 DIAGNOSIS — D518 Other vitamin B12 deficiency anemias: Secondary | ICD-10-CM | POA: Diagnosis not present

## 2019-04-16 DIAGNOSIS — F329 Major depressive disorder, single episode, unspecified: Secondary | ICD-10-CM | POA: Diagnosis not present

## 2019-04-16 DIAGNOSIS — I10 Essential (primary) hypertension: Secondary | ICD-10-CM | POA: Diagnosis not present

## 2019-04-16 DIAGNOSIS — I739 Peripheral vascular disease, unspecified: Secondary | ICD-10-CM | POA: Diagnosis not present

## 2019-04-16 DIAGNOSIS — M069 Rheumatoid arthritis, unspecified: Secondary | ICD-10-CM | POA: Diagnosis not present

## 2019-04-17 DIAGNOSIS — E119 Type 2 diabetes mellitus without complications: Secondary | ICD-10-CM | POA: Diagnosis not present

## 2019-04-17 DIAGNOSIS — D518 Other vitamin B12 deficiency anemias: Secondary | ICD-10-CM | POA: Diagnosis not present

## 2019-04-17 DIAGNOSIS — E038 Other specified hypothyroidism: Secondary | ICD-10-CM | POA: Diagnosis not present

## 2019-04-17 DIAGNOSIS — N390 Urinary tract infection, site not specified: Secondary | ICD-10-CM | POA: Diagnosis not present

## 2019-06-01 DIAGNOSIS — R4189 Other symptoms and signs involving cognitive functions and awareness: Secondary | ICD-10-CM | POA: Insufficient documentation

## 2019-08-10 ENCOUNTER — Other Ambulatory Visit: Payer: Self-pay

## 2019-08-10 ENCOUNTER — Emergency Department
Admission: EM | Admit: 2019-08-10 | Discharge: 2019-08-10 | Disposition: A | Discharge status: Home / Self Care | Payer: Medicare HMO | Attending: Emergency Medicine | Admitting: Emergency Medicine

## 2019-08-10 ENCOUNTER — Emergency Department: Payer: Medicare HMO

## 2019-08-10 ENCOUNTER — Encounter: Payer: Self-pay | Admitting: Radiology

## 2019-08-10 DIAGNOSIS — J449 Chronic obstructive pulmonary disease, unspecified: Secondary | ICD-10-CM | POA: Diagnosis not present

## 2019-08-10 DIAGNOSIS — Z79899 Other long term (current) drug therapy: Secondary | ICD-10-CM | POA: Insufficient documentation

## 2019-08-10 DIAGNOSIS — R079 Chest pain, unspecified: Secondary | ICD-10-CM

## 2019-08-10 DIAGNOSIS — R062 Wheezing: Secondary | ICD-10-CM | POA: Insufficient documentation

## 2019-08-10 DIAGNOSIS — I10 Essential (primary) hypertension: Secondary | ICD-10-CM | POA: Diagnosis not present

## 2019-08-10 DIAGNOSIS — R0789 Other chest pain: Secondary | ICD-10-CM | POA: Diagnosis not present

## 2019-08-10 DIAGNOSIS — Z20828 Contact with and (suspected) exposure to other viral communicable diseases: Secondary | ICD-10-CM | POA: Insufficient documentation

## 2019-08-10 LAB — CBC WITH DIFFERENTIAL/PLATELET
Abs Immature Granulocytes: 0.03 10*3/uL (ref 0.00–0.07)
Basophils Absolute: 0.1 10*3/uL (ref 0.0–0.1)
Basophils Relative: 2 %
Eosinophils Absolute: 0.2 10*3/uL (ref 0.0–0.5)
Eosinophils Relative: 3 %
HCT: 42.6 % (ref 36.0–46.0)
Hemoglobin: 13.7 g/dL (ref 12.0–15.0)
Immature Granulocytes: 0 %
Lymphocytes Relative: 32 %
Lymphs Abs: 2.3 10*3/uL (ref 0.7–4.0)
MCH: 30.5 pg (ref 26.0–34.0)
MCHC: 32.2 g/dL (ref 30.0–36.0)
MCV: 94.9 fL (ref 80.0–100.0)
Monocytes Absolute: 0.7 10*3/uL (ref 0.1–1.0)
Monocytes Relative: 9 %
Neutro Abs: 3.8 10*3/uL (ref 1.7–7.7)
Neutrophils Relative %: 54 %
Platelets: 218 10*3/uL (ref 150–400)
RBC: 4.49 MIL/uL (ref 3.87–5.11)
RDW: 15.6 % — ABNORMAL HIGH (ref 11.5–15.5)
WBC: 7.1 10*3/uL (ref 4.0–10.5)
nRBC: 0 % (ref 0.0–0.2)

## 2019-08-10 LAB — TROPONIN I (HIGH SENSITIVITY)
Troponin I (High Sensitivity): 7 ng/L (ref <18)
Troponin I (High Sensitivity): 9 ng/L (ref <18)

## 2019-08-10 LAB — COMPREHENSIVE METABOLIC PANEL
ALT: 13 U/L (ref 0–44)
AST: 27 U/L (ref 15–41)
Albumin: 3.7 g/dL (ref 3.5–5.0)
Alkaline Phosphatase: 49 U/L (ref 38–126)
Anion gap: 10 (ref 5–15)
BUN: 14 mg/dL (ref 8–23)
CO2: 29 mmol/L (ref 22–32)
Calcium: 9.1 mg/dL (ref 8.9–10.3)
Chloride: 99 mmol/L (ref 98–111)
Creatinine, Ser: 0.77 mg/dL (ref 0.44–1.00)
GFR calc Af Amer: >60 mL/min (ref >60)
GFR calc non Af Amer: >60 mL/min (ref >60)
Glucose, Bld: 99 mg/dL (ref 70–99)
Potassium: 3.9 mmol/L (ref 3.5–5.1)
Sodium: 138 mmol/L (ref 135–145)
Total Bilirubin: 0.6 mg/dL (ref 0.3–1.2)
Total Protein: 7.3 g/dL (ref 6.5–8.1)

## 2019-08-10 LAB — URINALYSIS, COMPLETE (UACMP) WITH MICROSCOPIC
Bacteria, UA: NONE SEEN
Bilirubin Urine: NEGATIVE
Glucose, UA: NEGATIVE mg/dL
Hgb urine dipstick: NEGATIVE
Ketones, ur: 20 mg/dL — AB
Nitrite: NEGATIVE
Protein, ur: NEGATIVE mg/dL
Specific Gravity, Urine: 1.028 (ref 1.005–1.030)
pH: 7 (ref 5.0–8.0)

## 2019-08-10 LAB — BRAIN NATRIURETIC PEPTIDE: B Natriuretic Peptide: 124 pg/mL — ABNORMAL HIGH (ref 0.0–100.0)

## 2019-08-10 MED ORDER — ALBUTEROL SULFATE HFA 108 (90 BASE) MCG/ACT IN AERS: 2.0000 | INHALATION_SPRAY | Freq: Four times a day (QID) | RESPIRATORY_TRACT | 0 refills | Status: DC | PRN

## 2019-08-10 MED ORDER — IOHEXOL 350 MG/ML SOLN
75.0000 mL | Freq: Once | INTRAVENOUS | Status: AC | PRN
  Administered 2019-08-10: 15:00:00 75 mL via INTRAVENOUS

## 2019-08-10 MED ORDER — IPRATROPIUM-ALBUTEROL 0.5-2.5 (3) MG/3ML IN SOLN
3.0000 mL | Freq: Once | RESPIRATORY_TRACT | Status: AC
  Administered 2019-08-10: 16:00:00 3 mL via RESPIRATORY_TRACT
  Filled 2019-08-10: qty 3

## 2019-08-10 NOTE — Discharge Instructions (Addendum)
Continue to take the antibiotic as prescribed.  Follow-up with the primary care doctor within the next week.  Return to the emergency department for new or worsening chest pain, difficulty breathing, fever, vomiting, or any other new or worsening symptoms that are concerning.

## 2019-08-10 NOTE — ED Provider Notes (Signed)
[facility] Emergency Department Provider Note  ____________________________________________   First MD Initiated Contact with Patient 08/10/19 1325     (approximate)  I have reviewed the triage vital signs and the nursing notes.   HISTORY  Chief Complaint Chest Pain    HPI Victoria Esparza is a 85 y.o. female  With PMHx below including dementia, afib, here with reported chest pain. Per report, she was c/o right sided chest pain prior to arrival. She does not recall complainng of this. She tells me she feels "just fine" and denies any complaints. No fever, chills. No abd pain or dysuria but history limited as above.  Level 5 caveat invoked as remainder of history, ROS, and physical exam limited due to patient's dementia.        Past Medical History:  Diagnosis Date   A-fib [facility])    Arthritis    Cognitive dysfunction    Colon adenoma    COPD (chronic obstructive pulmonary disease) (H[facility])    Depression    Familial tremor    HLD (hyperlipidemia)    Hypertension    Osteoarthritis    Osteoporosis    Ovarian cyst    Psoriatic arthropathy ([place])    Rotator cuff syndrome    Sigmoid diverticulitis     Patient Active Problem List   Diagnosis Date Noted   Psoriasis with arthropathy ([place]) 01/05/2016   Benign essential HTN 09/08/2015   Paroxysmal atrial fibrillation ([place]) 08/31/2015   OP (osteoporosis) 04/20/2015    Past Surgical History:  Procedure Laterality Date   ABDOMINAL HYSTERECTOMY     COLONOSCOPY      Prior to Admission medications   Medication Sig Start Date End Date Taking? Authorizing Provider  alendronate (FOSAMAX) 70 MG tablet Take 70 mg by mouth once a week. Reported on 01/22/2016    [provider]  aspirin 325 MG EC tablet Take 325 mg by mouth daily.    [provider]  calcium-vitamin D (SM CALCIUM 500/VITAMIN D3) 500-400 MG-UNIT tablet Take by mouth.    [provider]    cefpodoxime (VANTIN) 100 MG tablet Take 1 tablet (100 mg total) by mouth 2 (two) times daily. 04/24/18   Carrie Mew, MD  Cyanocobalamin (RA VITAMIN B-12 TR) 1000 MCG TBCR Take by mouth.    [provider]  estradiol (ESTRACE) 0.1 MG/GM vaginal cream INSERT 1 GRAM VAGINALLY 3 TIMES A WEEK. PEA SIZED AMOUNT PER URETHRA 02/14/17   Hollice Espy, MD  etanercept (ENBREL) 50 MG/ML injection Inject into the skin. 01/21/16   [provider]  fluticasone (FLONASE) 50 MCG/ACT nasal spray Place 2 sprays into both nostrils daily.    [provider]  folic acid (FOLVITE) 1 MG tablet Take 1 mg by mouth daily.    [provider]  metoprolol tartrate (LOPRESSOR) 25 MG tablet Take 25 mg by mouth 2 (two) times daily.    [provider]  naproxen sodium (RA NAPROXEN SODIUM) 220 MG tablet Take by mouth. Reported on 01/22/2016    [provider]  Omega-3 Fatty Acids (FISH OIL PO) Take by mouth.    [provider]  ondansetron (ZOFRAN ODT) 4 MG disintegrating tablet Take 1 tablet (4 mg total) by mouth every 8 (eight) hours as needed for nausea or vomiting. 04/24/18   Carrie Mew, MD  traMADol (ULTRAM) 50 MG tablet Take 50 mg by mouth every 6 (six) hours as needed.    [provider]    Allergies Patient  has no known allergies.  Family History  Problem Relation Age of Onset   Diabetes Mother    Ovarian cancer Mother    Lung cancer Father    Aneurysm Brother    Diabetes Brother    Breast cancer Sister    Colon cancer Sister    Diabetes Sister    Kidney disease Other        nephew   Bladder Cancer Neg Hx     Social History Social History   Tobacco Use   Smoking status: Former Smoker    Quit date: 05/26/1995    Years since quitting: 24.2   Smokeless tobacco: Never Used  Substance Use Topics   Alcohol use: No   Drug use: Never    Review of Systems  Review of Systems  Unable to perform ROS: Dementia      ____________________________________________  PHYSICAL EXAM:      VITAL SIGNS: ED Triage Vitals  Enc Vitals Group     BP 08/10/19 1325 (!) 169/107     Pulse Rate 08/10/19 1325 81     Resp 08/10/19 1325 (!) 22     Temp 08/10/19 1325 98.6 F (37 C)     Temp Source 08/10/19 1325 Oral     SpO2 08/10/19 1325 93 %     Weight 08/10/19 1316 140 lb (63.5 kg)     Height 08/10/19 1316 5\' 3"  (1.6 m)     Head Circumference --      Peak Flow --      Pain Score 08/10/19 1316 0     Pain Loc --      Pain Edu? --      Excl. in [place]? --      Physical Exam Vitals signs and nursing note reviewed.  Constitutional:      General: She is not in acute distress.    Appearance: She is well-developed.  HENT:     Head: Normocephalic and atraumatic.  Eyes:     Conjunctiva/sclera: Conjunctivae normal.  Neck:     Musculoskeletal: Neck supple.  Cardiovascular:     Rate and Rhythm: Normal rate and regular rhythm.     Heart sounds: Normal heart sounds. No murmur. No friction rub.  Pulmonary:     Effort: Pulmonary effort is normal. No respiratory distress.     Breath sounds: Decreased breath sounds and wheezing (scant, end-expiratory only) present. No rales.  Abdominal:     General: There is no distension.     Palpations: Abdomen is soft.     Tenderness: There is no abdominal tenderness.  Skin:    General: Skin is warm.     Capillary Refill: Capillary refill takes less than 2 seconds.  Neurological:     Mental Status: She is alert and oriented to person, place, and time.     Motor: No abnormal muscle tone.       ____________________________________________   LABS (all labs ordered are listed, but only abnormal results are displayed)  Labs Reviewed  CBC WITH DIFFERENTIAL/PLATELET - Abnormal; Notable for the following components:      Result Value   RDW 15.6 (*)    All other components within normal limits  BRAIN NATRIURETIC PEPTIDE - Abnormal; Notable for the following components:   B  Natriuretic Peptide 124.0 (*)    All other components within normal limits  COMPREHENSIVE METABOLIC PANEL  URINALYSIS, COMPLETE (UACMP) WITH MICROSCOPIC  TROPONIN I (HIGH SENSITIVITY)  TROPONIN I (HIGH SENSITIVITY)    ____________________________________________  EKG: Atrial fibrillation, VR 98. QRS 90, QTC 446. No ischemic changes. ________________________________________  RADIOLOGY All imaging, including plain films, CT scans, and ultrasounds, independently reviewed by me, and interpretations confirmed via formal radiology reads.  ED MD interpretation:   CXR: Clear CT Angio: Cardiomegaly, no PE, no PNA, no interstitial thickening  Official radiology report(s): Ct Angio Chest Pe W And/or Wo Contrast  Result Date: 08/10/2019 CLINICAL DATA:  Right side chest pain, cough EXAM: CT ANGIOGRAPHY CHEST WITH CONTRAST TECHNIQUE: Multidetector CT imaging of the chest was performed using the standard protocol during bolus administration of intravenous contrast. Multiplanar CT image reconstructions and MIPs were obtained to evaluate the vascular anatomy. CONTRAST:  4mL OMNIPAQUE IOHEXOL 350 MG/ML SOLN COMPARISON:  Portable chest earlier today FINDINGS: Cardiovascular: Cardiomegaly. Coronary artery and aortic atherosclerosis. No evidence of aortic aneurysm. Retroesophageal right subclavian artery. No filling defects in the pulmonary arteries to suggest pulmonary emboli. Mediastinum/Nodes: No mediastinal, hilar, or axillary adenopathy. Trachea and esophagus are unremarkable. Enlarged right thyroid lobe, likely nodular goiter. Moderate-sized hiatal hernia. Lungs/Pleura: Biapical scarring. No pulmonary nodule in the right lower lung as questioned on prior chest x-ray. No confluent opacity or effusion. Upper Abdomen: Imaging into the upper abdomen shows no acute findings. Musculoskeletal: Chest wall soft tissues are unremarkable. No acute bony abnormality2. Review of the MIP images confirms the above  findings. IMPRESSION: No evidence of pulmonary embolus. Cardiomegaly, coronary artery disease. Retroesophageal right subclavian artery. No pulmonary nodule in the right lower lung as questioned on prior chest x-ray. Moderate-sized hiatal hernia. Aortic Atherosclerosis (ICD10-I70.0). Electronically Signed   By: Rolm Baptise M.D.   On: 08/10/2019 15:16   Dg Chest Portable 1 View  Result Date: 08/10/2019 CLINICAL DATA:  Right chest pain and productive cough. EXAM: PORTABLE CHEST 1 VIEW COMPARISON:  09/15/2014 FINDINGS: Lungs are hyperexpanded. The cardio pericardial silhouette is enlarged. Interstitial markings are diffusely coarsened with chronic features. Small nodular density at the right base may be related to the anterior sixth rib. Bones are diffusely demineralized. Insert wire IMPRESSION: Cardiomegaly with hyperexpansion suggesting emphysema. Small nodular density at the right base may be related to the anterior end of the sixth rib. CT chest without contrast could be used to exclude pulmonary nodule. Electronically Signed   By: Misty Stanley M.D.   On: 08/10/2019 14:07    ____________________________________________  PROCEDURES   Procedure(s) performed (including Critical Care):  Procedures  ____________________________________________  INITIAL IMPRESSION / MDM / [place] / ED COURSE  As part of my medical decision making, I reviewed the following data within the electronic MEDICAL RECORD NUMBER Notes from prior ED visits and [place] Controlled Substance Database      *NAZLY DIJULIO was evaluated in Emergency Department on 08/10/2019 for the symptoms described in the history of present illness. She was evaluated in the context of the global COVID-19 pandemic, which necessitated consideration that the patient might be at risk for infection with the SARS-CoV-2 virus that causes COVID-19. Institutional protocols and algorithms that pertain to the evaluation of patients at risk for  COVID-19 are in a state of rapid change based on information released by regulatory bodies including the CDC and federal and state organizations. These policies and algorithms were followed during the patient's care in the ED.  Some ED evaluations and interventions may be delayed as a result of limited staffing during the pandemic.*      Medical Decision Making: 85 yo F here with reported transient R-sided cp. EKG is non-ischemic, trop  neg - doubt ACS. WIll obtain delta trop. CT Angio is neg for PE, PNA, PTX, or other emergent pathology. No signs of RA-associated ILD. DDx includes chest wall pain, RA-related pleurisy.   Otherwise, pt noted to have some mild wheezes on exam. Will trial breathing tx. She is also frequently coughing which could explain her chest wall pain. If this seems to improve her breathing, could consider trial of scheduled bronchodilators as outpt.  UA is also pending. Dx with UTI on Wed, on Macrobid. Will eval to make sure it's clearing, lower concern for R-sided pyelo clinically. ____________________________________________  FINAL CLINICAL IMPRESSION(S) / ED DIAGNOSES  Final diagnoses:  Right-sided chest pain  Chronic obstructive pulmonary disease, unspecified COPD type ([place])     MEDICATIONS GIVEN DURING THIS VISIT:  Medications  ipratropium-albuterol (DUONEB) 0.5-2.5 (3) MG/3ML nebulizer solution 3 mL (has no administration in time range)  iohexol (OMNIPAQUE) 350 MG/ML injection 75 mL (75 mLs Intravenous Contrast Given 08/10/19 1453)     ED Discharge Orders    None       Note:  This document was prepared using Dragon voice recognition software and may include unintentional dictation errors.   Duffy Bruce, MD 08/10/19 [phone]

## 2019-08-10 NOTE — ED Notes (Signed)
Dr Ellender Hose aware of bladder scan. Does not want in and out cath at this time.

## 2019-08-10 NOTE — ED Notes (Signed)
Pt unable to wear mask due to productive cough.

## 2019-08-10 NOTE — ED Notes (Signed)
Pt has taken gown off, purewick off, and monitor equipment off. Urine found on floor and bed. Pt cleaned up. Md notified, pt left off monitor per MD to decrease agitation.

## 2019-08-10 NOTE — ED Notes (Signed)
Bed alarm placed on patient, yellow socks, and yellow wrist band also placed on patient.

## 2019-08-10 NOTE — ED Triage Notes (Signed)
Pt bought in from [facility] from [place].  Per Ems pt reports right sided chest pain, and a productive cough.

## 2019-08-10 NOTE — ED Notes (Signed)
Pt placed in daughter Victoria Esparza.  She will take back to [facility]. Pt unable to sign, daughter signed paper form for discharge instructions and placed in medical record box.

## 2019-08-10 NOTE — ED Notes (Signed)
Dr Ellender Hose in room.

## 2019-08-10 NOTE — ED Provider Notes (Signed)
-----------------------------------------   5:15 PM on 08/10/2019 -----------------------------------------  I took over care of this patient from Dr. Ellender Hose.  At the time of signout, the patient was pending a repeat troponin and a urinalysis.  Urinalysis shows no significant findings so it appears that her recently diagnosed UTI is clearing appropriately with Macrobid.  Repeat troponin shows no significant increase.  The patient is stable for discharge home at this time.  She is slightly tachycardic but this is consistent with a side effect of albuterol that was given here in the ED.  Return precautions have been provided.   Arta Silence, MD 08/10/19 [phone]

## 2019-08-11 LAB — SARS CORONAVIRUS 2 (TAT 6-24 HRS): SARS Coronavirus 2: NEGATIVE

## 2019-09-01 ENCOUNTER — Emergency Department: Payer: Medicare HMO

## 2019-09-01 ENCOUNTER — Other Ambulatory Visit: Payer: Self-pay

## 2019-09-01 ENCOUNTER — Emergency Department
Admission: EM | Admit: 2019-09-01 | Discharge: 2019-09-01 | Disposition: A | Discharge status: Home / Self Care | Payer: Medicare HMO | Attending: Emergency Medicine | Admitting: Emergency Medicine

## 2019-09-01 DIAGNOSIS — I10 Essential (primary) hypertension: Secondary | ICD-10-CM | POA: Diagnosis not present

## 2019-09-01 DIAGNOSIS — J449 Chronic obstructive pulmonary disease, unspecified: Secondary | ICD-10-CM | POA: Insufficient documentation

## 2019-09-01 DIAGNOSIS — Z7982 Long term (current) use of aspirin: Secondary | ICD-10-CM | POA: Diagnosis not present

## 2019-09-01 DIAGNOSIS — Z87891 Personal history of nicotine dependence: Secondary | ICD-10-CM | POA: Diagnosis not present

## 2019-09-01 DIAGNOSIS — R040 Epistaxis: Secondary | ICD-10-CM | POA: Diagnosis present

## 2019-09-01 DIAGNOSIS — Z79899 Other long term (current) drug therapy: Secondary | ICD-10-CM | POA: Insufficient documentation

## 2019-09-01 LAB — CBC
HCT: 41 % (ref 36.0–46.0)
Hemoglobin: 12.8 g/dL (ref 12.0–15.0)
MCH: 29.8 pg (ref 26.0–34.0)
MCHC: 31.2 g/dL (ref 30.0–36.0)
MCV: 95.6 fL (ref 80.0–100.0)
Platelets: 168 10*3/uL (ref 150–400)
RBC: 4.29 MIL/uL (ref 3.87–5.11)
RDW: 16.4 % — ABNORMAL HIGH (ref 11.5–15.5)
WBC: 7 10*3/uL (ref 4.0–10.5)
nRBC: 0 % (ref 0.0–0.2)

## 2019-09-01 LAB — BASIC METABOLIC PANEL
Anion gap: 9 (ref 5–15)
BUN: 10 mg/dL (ref 8–23)
CO2: 27 mmol/L (ref 22–32)
Calcium: 8.6 mg/dL — ABNORMAL LOW (ref 8.9–10.3)
Chloride: 102 mmol/L (ref 98–111)
Creatinine, Ser: 0.84 mg/dL (ref 0.44–1.00)
GFR calc Af Amer: >60 mL/min (ref >60)
GFR calc non Af Amer: >60 mL/min (ref >60)
Glucose, Bld: 95 mg/dL (ref 70–99)
Potassium: 4 mmol/L (ref 3.5–5.1)
Sodium: 138 mmol/L (ref 135–145)

## 2019-09-01 LAB — PROTIME-INR
INR: 1.1 (ref 0.8–1.2)
Prothrombin Time: 13.9 seconds (ref 11.4–15.2)

## 2019-09-01 MED ORDER — CEPHALEXIN 500 MG PO CAPS: 500.0000 mg | ORAL_CAPSULE | Freq: Two times a day (BID) | ORAL | 0 refills | Status: DC

## 2019-09-01 NOTE — ED Triage Notes (Signed)
Pt arrived to ED via EMS from [facility] for a nose bleed that began approx 30- 60 mins ago. EMS reports 1 spray afrin given. Pt takes blood thinners. NAD noted.

## 2019-09-01 NOTE — ED Notes (Signed)
Pt O2sat 86%, placed on 2L [place]. Dr Kerman Passey notified.

## 2019-09-01 NOTE — Discharge Instructions (Addendum)
Please follow-up with ENT in the next 3 to 5 days for recheck/reevaluation.  Please take your antibiotics as prescribed until the sponge has been removed.  Return to the emergency department for any significant bleed, or any other symptom personally concerning to yourself.  During your work-up today her oxygen level was found to be on the lower side approximately 92 to 94% on room air.  Please follow-up with your doctor to discuss if any further work-up would be required.

## 2019-09-01 NOTE — ED Provider Notes (Signed)
[facility] Emergency Department Provider Note  Time seen: 8:48 AM  I have reviewed the triage vital signs and the nursing notes.   HISTORY  Chief Complaint Epistaxis   HPI Victoria Esparza is a 85 y.o. female with a past medical history of atrial fibrillation on aspirin, hyperlipidemia, hypertension, presents to the emergency department  for nosebleed.  Patient is coming from her nursing facility found to have a nosebleed since early this morning.  Patient is awake alert, no acute distress.  Patient does have an occasional cough and is coughing up blood at times.  Denies any fever or shortness of breath although the patient was found to be hypoxic around 89% currently on room air (nasal clamp in place as well).  Past Medical History:  Diagnosis Date  . A-fib ([place])   . Arthritis   . Cognitive dysfunction   . Colon adenoma   . COPD (chronic obstructive pulmonary disease) ([place])   . Depression   . Familial tremor   . HLD (hyperlipidemia)   . Hypertension   . Osteoarthritis   . Osteoporosis   . Ovarian cyst   . Psoriatic arthropathy ([place])   . Rotator cuff syndrome   . Sigmoid diverticulitis     Patient Active Problem List   Diagnosis Date Noted  . Psoriasis with arthropathy ([place]) 01/05/2016  . Benign essential HTN 09/08/2015  . Paroxysmal atrial fibrillation ([place]) 08/31/2015  . OP (osteoporosis) 04/20/2015    Past Surgical History:  Procedure Laterality Date  . ABDOMINAL HYSTERECTOMY    . COLONOSCOPY      Prior to Admission medications   Medication Sig Start Date End Date Taking? Authorizing Provider  albuterol (VENTOLIN HFA) 108 (90 Base) MCG/ACT inhaler Inhale 2 puffs into the lungs every 6 (six) hours as needed for up to 7 days for wheezing or shortness of breath. 08/10/19 08/17/19  Arta Silence, MD  alendronate (FOSAMAX) 70 MG tablet Take 70 mg by mouth once a week. Reported on 01/22/2016    [provider]  aspirin 325 MG EC  tablet Take 325 mg by mouth daily.    [provider]  calcium-vitamin D (SM CALCIUM 500/VITAMIN D3) 500-400 MG-UNIT tablet Take by mouth.    [provider]  cefpodoxime (VANTIN) 100 MG tablet Take 1 tablet (100 mg total) by mouth 2 (two) times daily. 04/24/18   Carrie Mew, MD  Cyanocobalamin (RA VITAMIN B-12 TR) 1000 MCG TBCR Take by mouth.    [provider]  estradiol (ESTRACE) 0.1 MG/GM vaginal cream INSERT 1 GRAM VAGINALLY 3 TIMES A WEEK. PEA SIZED AMOUNT PER URETHRA 02/14/17   Hollice Espy, MD  etanercept (ENBREL) 50 MG/ML injection Inject into the skin. 01/21/16   [provider]  fluticasone (FLONASE) 50 MCG/ACT nasal spray Place 2 sprays into both nostrils daily.    [provider]  folic acid (FOLVITE) 1 MG tablet Take 1 mg by mouth daily.    [provider]  metoprolol tartrate (LOPRESSOR) 25 MG tablet Take 25 mg by mouth 2 (two) times daily.    [provider]  naproxen sodium (RA NAPROXEN SODIUM) 220 MG tablet Take by mouth. Reported on 01/22/2016    [provider]  Omega-3 Fatty Acids (FISH OIL PO) Take by mouth.    [provider]  ondansetron (ZOFRAN ODT) 4 MG disintegrating tablet Take 1 tablet (4 mg total) by mouth every 8 (eight) hours as needed for nausea or vomiting. 04/24/18   Joni Fears,  Doren Custard, MD  traMADol (ULTRAM) 50 MG tablet Take 50 mg by mouth every 6 (six) hours as needed.    [provider]    No Known Allergies  Family History  Problem Relation Age of Onset  . Diabetes Mother   . Ovarian cancer Mother   . Lung cancer Father   . Aneurysm Brother   . Diabetes Brother   . Breast cancer Sister   . Colon cancer Sister   . Diabetes Sister   . Kidney disease Other        nephew  . Bladder Cancer Neg Hx     Social History Social History   Tobacco Use  . Smoking status: Former Smoker    Quit date: 05/26/1995    Years since quitting: 24.2  . Smokeless tobacco: Never  Used  Substance Use Topics  . Alcohol use: No  . Drug use: Never    Review of Systems Constitutional: Negative for fever ENT: Positive for nosebleed appears to be mostly left-sided  Cardiovascular: Negative for chest pain. Respiratory: Negative for shortness of breath.  Occasionally coughing in the emergency department but mostly coughing up blood  Gastrointestinal: Negative for abdominal pain Musculoskeletal: Negative for musculoskeletal complaints Neurological: Negative for headache All other ROS negative  ____________________________________________   PHYSICAL EXAM:  VITAL SIGNS: ED Triage Vitals  Enc Vitals Group     BP 09/01/19 0839 134/87     Pulse Rate 09/01/19 0839 86     Resp 09/01/19 0839 20     Temp 09/01/19 0839 98.5 F (36.9 C)     Temp Source 09/01/19 0839 Axillary     SpO2 09/01/19 0844 (!) 88 %     Weight 09/01/19 0834 138 lb 14.2 oz (63 kg)     Height 09/01/19 0834 5\' 5"  (1.651 m)     Head Circumference --      Peak Flow --      Pain Score 09/01/19 0834 0     Pain Loc --      Pain Edu? --      Excl. in [place]? --    Constitutional: Alert. Well appearing and in no distress. Eyes: Normal exam ENT      Head: Normocephalic and atraumatic.  Moderate epistaxis from left nostril and posterior pharynx.      Mouth/Throat: Mucous membranes are moist. Cardiovascular: Normal rate, regular rhythm.  Respiratory: Normal respiratory effort without tachypnea nor retractions. Breath sounds are clear Gastrointestinal: Soft and nontender. No distention.   Musculoskeletal: Nontender with normal range of motion in all extremities Neurologic:  Normal speech and language. No gross focal neurologic deficits  Skin:  Skin is warm, dry and intact.  Psychiatric: Mood and affect are normal.   ____________________________________________   RADIOLOGY  IMPRESSION:  1. No radiographic evidence of acute cardiopulmonary disease.  2. Aortic atherosclerosis.    ____________________________________________   INITIAL IMPRESSION / ASSESSMENT AND PLAN / ED COURSE  Pertinent labs & imaging results that were available during my care of the patient were reviewed by me and considered in my medical decision making (see chart for details).   Patient presents to the emergency department from a nursing facility for epistaxis starting this morning.  On examination patient appears to have left nostril involvement no bleeding from the right nostril.  Nothing visible on the anterior septum.  Continues to have bleeding in the posterior pharynx.  I placed a Merocel sponge, infused with Afrin.  Patient is noted to be somewhat hypoxic 89  to 92% on room air however nasal clamp is in place.  We will reassess after hemostasis is achieved.  We will check labs as well as a chest x-ray as a precaution and continue to closely monitor.  Lab work is largely within normal limits.  Chest x-ray is negative.  After Merocel sponge placement patient is now hemostatic.  We will leave the sponge in place have the patient follow-up with ENT this coming week.  Patient is holding oxygen saturations in the low 90s 91 to 94% on room air.  However she is not hypoxic and asymptomatic I believe the patient would be safe for discharge home and PCP follow-up.  [facility] was evaluated in Emergency Department on 09/01/2019 for the symptoms described in the history of present illness. She was evaluated in the context of the global COVID-19 pandemic, which necessitated consideration that the patient might be at risk for infection with the SARS-CoV-2 virus that causes COVID-19. Institutional protocols and algorithms that pertain to the evaluation of patients at risk for COVID-19 are in a state of rapid change based on information released by regulatory bodies including the CDC and federal and state organizations. These policies and algorithms were followed during the patient's care in the  ED.  ____________________________________________   FINAL CLINICAL IMPRESSION(S) / ED DIAGNOSES  Epistaxis   Harvest Dark, MD 09/01/19 1130

## 2019-09-01 NOTE — ED Notes (Signed)
No bleeding from nose noted at this time. Pt calm and resting.

## 2019-09-01 NOTE — ED Notes (Signed)
X-ray at bedside

## 2019-09-01 NOTE — ED Notes (Signed)
Daughter at bedside.

## 2019-11-14 ENCOUNTER — Emergency Department
Admission: EM | Admit: 2019-11-14 | Discharge: 2019-11-15 | Disposition: A | Discharge status: Home / Self Care | Payer: Medicare HMO | Attending: Emergency Medicine | Admitting: Emergency Medicine

## 2019-11-14 ENCOUNTER — Emergency Department: Payer: Medicare HMO

## 2019-11-14 ENCOUNTER — Other Ambulatory Visit: Payer: Self-pay

## 2019-11-14 DIAGNOSIS — Y999 Unspecified external cause status: Secondary | ICD-10-CM | POA: Insufficient documentation

## 2019-11-14 DIAGNOSIS — Z87891 Personal history of nicotine dependence: Secondary | ICD-10-CM | POA: Insufficient documentation

## 2019-11-14 DIAGNOSIS — S9002XA Contusion of left ankle, initial encounter: Secondary | ICD-10-CM | POA: Diagnosis not present

## 2019-11-14 DIAGNOSIS — W19XXXA Unspecified fall, initial encounter: Secondary | ICD-10-CM

## 2019-11-14 DIAGNOSIS — Y9389 Activity, other specified: Secondary | ICD-10-CM | POA: Insufficient documentation

## 2019-11-14 DIAGNOSIS — Z79899 Other long term (current) drug therapy: Secondary | ICD-10-CM | POA: Diagnosis not present

## 2019-11-14 DIAGNOSIS — W06XXXA Fall from bed, initial encounter: Secondary | ICD-10-CM | POA: Insufficient documentation

## 2019-11-14 DIAGNOSIS — J449 Chronic obstructive pulmonary disease, unspecified: Secondary | ICD-10-CM | POA: Insufficient documentation

## 2019-11-14 DIAGNOSIS — Y92122 Bedroom in nursing home as the place of occurrence of the external cause: Secondary | ICD-10-CM | POA: Insufficient documentation

## 2019-11-14 DIAGNOSIS — I10 Essential (primary) hypertension: Secondary | ICD-10-CM | POA: Insufficient documentation

## 2019-11-14 DIAGNOSIS — S99912A Unspecified injury of left ankle, initial encounter: Secondary | ICD-10-CM | POA: Diagnosis present

## 2019-11-14 NOTE — ED Triage Notes (Signed)
Pt was getting out of bed and was sitting on edge of mattress and slipped to floor. Pt denies hitting head. Pt denies LOC. Pt states mattress was softer than she realized. Pt with c/o pain in left ankle and foot.

## 2019-11-14 NOTE — ED Provider Notes (Signed)
[facility] Emergency Department Provider Note  ____________________________________________  Time seen: Approximately 11:58 PM  I have reviewed the triage vital signs and the nursing notes.   HISTORY  Chief Complaint Fall   HPI Victoria Esparza is a 85 y.o. female with several chronic medical problems as listed below who presents for evaluation of left ankle and foot pain.  Patient was try to get out of bed when she slipped to the floor and twisted her ankle.  She denies head trauma or LOC.  She denies hip pain or back pain, no neck pain.  At this time she reports no pain in her ankle just minimal swelling.  Initially she refused transport however when she was unable to bear weight she agreed to come to the emergency room.  She denies any prior injuries to her ankle.  Her pain and swelling are located in her lateral malleolus area.   Past Medical History:  Diagnosis Date   A-fib [facility])    Arthritis    Cognitive dysfunction    Colon adenoma    COPD (chronic obstructive pulmonary disease) (HCC)    Depression    Familial tremor    HLD (hyperlipidemia)    Hypertension    Osteoarthritis    Osteoporosis    Ovarian cyst    Psoriatic arthropathy ([place])    Rotator cuff syndrome    Sigmoid diverticulitis     Patient Active Problem List   Diagnosis Date Noted   Psoriasis with arthropathy ([place]) 01/05/2016   Benign essential HTN 09/08/2015   Paroxysmal atrial fibrillation ([place]) 08/31/2015   OP (osteoporosis) 04/20/2015    Past Surgical History:  Procedure Laterality Date   ABDOMINAL HYSTERECTOMY     COLONOSCOPY      Prior to Admission medications   Medication Sig Start Date End Date Taking? Authorizing Provider  albuterol (VENTOLIN HFA) 108 (90 Base) MCG/ACT inhaler Inhale 2 puffs into the lungs every 6 (six) hours as needed for up to 7 days for wheezing or shortness of breath. 08/10/19 08/17/19  Arta Silence, MD    alendronate (FOSAMAX) 70 MG tablet Take 70 mg by mouth once a week. Reported on 01/22/2016    [provider]  aspirin 325 MG EC tablet Take 325 mg by mouth daily.    [provider]  calcium-vitamin D (SM CALCIUM 500/VITAMIN D3) 500-400 MG-UNIT tablet Take by mouth.    [provider]  cefpodoxime (VANTIN) 100 MG tablet Take 1 tablet (100 mg total) by mouth 2 (two) times daily. Patient not taking: Reported on 09/01/2019 04/24/18   Carrie Mew, MD  cephALEXin (KEFLEX) 500 MG capsule Take 1 capsule (500 mg total) by mouth 2 (two) times daily. 09/01/19   Harvest Dark, MD  citalopram (CELEXA) 10 MG tablet Take 10 mg by mouth daily.    [provider]  docusate sodium (COLACE) 100 MG capsule Take 100 mg by mouth daily.    [provider]  ENBREL SURECLICK 50 MG/ML injection Inject 0.8 mLs into the muscle once a week. 08/21/19   [provider]  estradiol (ESTRACE) 0.1 MG/GM vaginal cream INSERT 1 GRAM VAGINALLY 3 TIMES A WEEK. PEA SIZED AMOUNT PER URETHRA Patient not taking: Reported on 09/01/2019 02/14/17   Hollice Espy, MD  fluticasone [facility]) 50 MCG/ACT nasal spray Place 2 sprays into both nostrils daily.    [provider]  folic acid (FOLVITE) 1 MG tablet Take 1 mg by mouth daily.    [provider]  gabapentin (NEURONTIN) 300 MG capsule Take 300 mg by mouth daily. 08/17/19   [provider]  Melatonin 5 MG TABS Take 5 mg by mouth at bedtime as needed (sleep).    [provider]  Methotrexate, PF, 25 MG/0.4ML SOAJ Inject 0.8 mLs into the skin once a week. Monday 0800    [provider]  metoprolol tartrate (LOPRESSOR) 25 MG tablet Take 25 mg by mouth 2 (two) times daily.    [provider]  naproxen sodium (RA NAPROXEN SODIUM) 220 MG tablet Take by mouth. Reported on 01/22/2016    [provider]  Omega-3 Fatty Acids (FISH OIL PO) Take by mouth.    [provider]   ondansetron (ZOFRAN ODT) 4 MG disintegrating tablet Take 1 tablet (4 mg total) by mouth every 8 (eight) hours as needed for nausea or vomiting. Patient not taking: Reported on 09/01/2019 04/24/18   Carrie Mew, MD  traMADol (ULTRAM) 50 MG tablet Take 50 mg by mouth every 6 (six) hours as needed.    [provider]  vitamin B-12 (CYANOCOBALAMIN) 1000 MCG tablet Take 1,000 mcg by mouth daily.    [provider]    Allergies Patient has no known allergies.  Family History  Problem Relation Age of Onset   Diabetes Mother    Ovarian cancer Mother    Lung cancer Father    Aneurysm Brother    Diabetes Brother    Breast cancer Sister    Colon cancer Sister    Diabetes Sister    Kidney disease Other        nephew   Bladder Cancer Neg Hx     Social History Social History   Tobacco Use   Smoking status: Former Smoker    Quit date: 05/26/1995    Years since quitting: 24.4   Smokeless tobacco: Never Used  Substance Use Topics   Alcohol use: No   Drug use: Never    Review of Systems  Constitutional: Negative for fever. Eyes: Negative for visual changes. ENT: Negative for sore throat. Neck: No neck pain  Cardiovascular: Negative for chest pain. Respiratory: Negative for shortness of breath. Gastrointestinal: Negative for abdominal pain, vomiting or diarrhea. Genitourinary: Negative for dysuria. Musculoskeletal: Negative for back pain. + L ankle pain Skin: Negative for rash. Neurological: Negative for headaches, weakness or numbness. Psych: No SI or HI  ____________________________________________   PHYSICAL EXAM:  VITAL SIGNS: ED Triage Vitals  Enc Vitals Group     BP 11/14/19 2306 (!) 142/89     Pulse Rate 11/14/19 2306 78     Resp 11/14/19 2306 16     Temp 11/14/19 2306 98 F (36.7 C)     Temp Source 11/14/19 2306 Oral     SpO2 11/14/19 2306 94 %     Weight 11/14/19 2307 130 lb (59 kg)     Height 11/14/19 2307 5\' 6"  (1.676 m)      Head Circumference --      Peak Flow --      Pain Score 11/14/19 2307 10     Pain Loc --      Pain Edu? --      Excl. in [place]? --     Constitutional: Alert and oriented. Well appearing and in no apparent distress. HEENT:      Head: Normocephalic and atraumatic.         Eyes: Conjunctivae are normal. Sclera is non-icteric.       Mouth/Throat: Mucous  membranes are moist.       Neck: Supple with no signs of meningismus.  No C-spine tenderness Cardiovascular: Regular rate and rhythm.  Respiratory: Normal respiratory effort.  Gastrointestinal: Soft, non tender, and non distended with positive bowel sounds. No rebound or guarding. Musculoskeletal: No T and L-spine tenderness, full painless range of motion of all extremities, minimal swelling of the lateral malleolus on the left with a small bruise over it.  No laceration.  Ankle has full range of motion.  She has no tenderness to palpation over her calcaneus or midfoot Neurologic: Normal speech and language. Face is symmetric. Moving all extremities. No gross focal neurologic deficits are appreciated. Skin: Skin is warm, dry and intact. No rash noted. Psychiatric: Mood and affect are normal. Speech and behavior are normal.  ____________________________________________   LABS (all labs ordered are listed, but only abnormal results are displayed)  Labs Reviewed - No data to display ____________________________________________  EKG  none  ____________________________________________  RADIOLOGY  I have personally reviewed the images performed during this visit and I agree with the Radiologist's read.   Interpretation by Radiologist:  Dg Ankle Complete Left  Result Date: 11/14/2019 CLINICAL DATA:  Pain status post fall EXAM: LEFT ANKLE COMPLETE - 3+ VIEW COMPARISON:  None. FINDINGS: There is soft tissue swelling about the lateral aspect the ankle with findings suspicious for a small avulsion fracture arising from the lateral  calcaneus, likely at the insertion of the extensor digitorum brevis. There is an Achilles tendon enthesophyte. IMPRESSION: 1. Possible small avulsion fracture arising from the lateral calcaneus at the insertion site of the extensor digitorum brevis. 2. Soft tissue swelling about the lateral aspect of the ankle. Electronically Signed   By: Constance Holster M.D.   On: 11/14/2019 23:36   Dg Foot Complete Left  Result Date: 11/14/2019 CLINICAL DATA:  Pain status post fall EXAM: LEFT FOOT - COMPLETE 3+ VIEW COMPARISON:  None. FINDINGS: There is an old healed fracture of the distal second metatarsal. There is no definite acute displaced fracture or dislocation. There is osteopenia which limits detection of nondisplaced fractures. IMPRESSION: Soft tissue swelling. No definite acute displaced fracture or dislocation. Please see separate ankle radiograph dictation for further details. Old healed fracture of the second metatarsal. Electronically Signed   By: Constance Holster M.D.   On: 11/14/2019 23:38     ____________________________________________   PROCEDURES  Procedure(s) performed: None Procedures Critical Care performed:  None ____________________________________________   INITIAL IMPRESSION / ASSESSMENT AND PLAN / ED COURSE  85 y.o. female with several chronic medical problems as listed below who presents for evaluation of left ankle and foot pain after slipping from the edge of the bed. No head trauma, neck trauma, or back trauma. Patient with minimal swelling of the lateral malleolus on the left with small bruise over it. Full painless ROM of the ankle.  X-ray raises possibility of a small avulsion fracture of the calcaneus however palpation of the area and the entire calcaneus elicits no pain or tenderness, therefore clinically less likely an acute issue.  Patient denies of any pain at this time and refused Tylenol.  She was able to ambulate with a walker and will be discharged back to her  nursing home on supportive care, ice, Tylenol and follow-up with her PCP.       As part of my medical decision making, I reviewed the following data within the [place] notes reviewed and incorporated, Old chart reviewed, Radiograph reviewed ,  Notes from prior ED visits and [place] Controlled Substance Database   Please note:  Patient was evaluated in Emergency Department today for the symptoms described in the history of present illness. Patient was evaluated in the context of the global COVID-19 pandemic, which necessitated consideration that the patient might be at risk for infection with the SARS-CoV-2 virus that causes COVID-19. Institutional protocols and algorithms that pertain to the evaluation of patients at risk for COVID-19 are in a state of rapid change based on information released by regulatory bodies including the CDC and federal and state organizations. These policies and algorithms were followed during the patient's care in the ED.  Some ED evaluations and interventions may be delayed as a result of limited staffing during the pandemic.   ____________________________________________   FINAL CLINICAL IMPRESSION(S) / ED DIAGNOSES   Final diagnoses:  Fall, initial encounter  Contusion of left ankle, initial encounter      NEW MEDICATIONS STARTED DURING THIS VISIT:  ED Discharge Orders    None       Note:  This document was prepared using Dragon voice recognition software and may include unintentional dictation errors.    Alfred Levins, [place], MD 11/15/19 [phone]

## 2019-11-14 NOTE — ED Notes (Signed)
Pt's pain re assessed. Pt does not appear to be in pain, pt also denies pain.

## 2019-11-14 NOTE — ED Notes (Signed)
Pt states she is ready to go home. Pt reassured. Pt relaxed. Not trying to get OOB

## 2019-11-14 NOTE — ED Notes (Signed)
Pt given warm blanket.

## 2019-11-15 MED ORDER — ACETAMINOPHEN 500 MG PO TABS
1000.0000 mg | ORAL_TABLET | Freq: Once | ORAL | Status: AC
  Administered 2019-11-15: 1000 mg via ORAL
  Filled 2019-11-15: qty 2

## 2019-11-15 NOTE — ED Notes (Signed)
Pt d/c to [facility] via EMS.

## 2019-11-15 NOTE — ED Notes (Signed)
Pt's pain reassessed, pt states she does not have any pain "except in that left foot" MD notified.

## 2019-11-15 NOTE — ED Notes (Signed)
Pt is alert but at times unable to remember things such as names. Pt oriented to self and situation. Pt reminded not to get OOB without assistance. Yellow socks, fall bracelet on at arrival to ED. Bed alarm in place.

## 2019-11-15 NOTE — ED Notes (Signed)
EDP request that pt be walked. Pt walked with 2 assist, gait belt and walker. Pt states foot hurts when walking. Pt able to go from sit to stand with one assist and walker to steady her. Pt unsteady and first but then able to walk with walker with this RN and NT stand by for support. Pt returned to bed with left foot elevated. Ace wrap applied prior to walk per EDP

## 2019-11-15 NOTE — ED Notes (Signed)
Report given to [facility] at [facility]

## 2019-12-25 DIAGNOSIS — F339 Major depressive disorder, recurrent, unspecified: Secondary | ICD-10-CM | POA: Diagnosis not present

## 2019-12-25 DIAGNOSIS — F0390 Unspecified dementia without behavioral disturbance: Secondary | ICD-10-CM | POA: Diagnosis not present

## 2020-01-01 DIAGNOSIS — F0390 Unspecified dementia without behavioral disturbance: Secondary | ICD-10-CM | POA: Diagnosis not present

## 2020-01-01 DIAGNOSIS — F339 Major depressive disorder, recurrent, unspecified: Secondary | ICD-10-CM | POA: Diagnosis not present

## 2020-01-08 DIAGNOSIS — F0390 Unspecified dementia without behavioral disturbance: Secondary | ICD-10-CM | POA: Diagnosis not present

## 2020-01-08 DIAGNOSIS — F339 Major depressive disorder, recurrent, unspecified: Secondary | ICD-10-CM | POA: Diagnosis not present

## 2020-01-14 DIAGNOSIS — I10 Essential (primary) hypertension: Secondary | ICD-10-CM | POA: Diagnosis not present

## 2020-01-14 DIAGNOSIS — G250 Essential tremor: Secondary | ICD-10-CM | POA: Diagnosis not present

## 2020-01-14 DIAGNOSIS — Z7982 Long term (current) use of aspirin: Secondary | ICD-10-CM | POA: Diagnosis not present

## 2020-01-14 DIAGNOSIS — F0390 Unspecified dementia without behavioral disturbance: Secondary | ICD-10-CM | POA: Diagnosis not present

## 2020-01-14 DIAGNOSIS — M069 Rheumatoid arthritis, unspecified: Secondary | ICD-10-CM | POA: Diagnosis not present

## 2020-01-14 DIAGNOSIS — R269 Unspecified abnormalities of gait and mobility: Secondary | ICD-10-CM | POA: Diagnosis not present

## 2020-01-14 DIAGNOSIS — I4891 Unspecified atrial fibrillation: Secondary | ICD-10-CM | POA: Diagnosis not present

## 2020-01-17 DIAGNOSIS — F339 Major depressive disorder, recurrent, unspecified: Secondary | ICD-10-CM | POA: Diagnosis not present

## 2020-01-17 DIAGNOSIS — F0390 Unspecified dementia without behavioral disturbance: Secondary | ICD-10-CM | POA: Diagnosis not present

## 2020-01-22 DIAGNOSIS — F339 Major depressive disorder, recurrent, unspecified: Secondary | ICD-10-CM | POA: Diagnosis not present

## 2020-01-22 DIAGNOSIS — F0390 Unspecified dementia without behavioral disturbance: Secondary | ICD-10-CM | POA: Diagnosis not present

## 2020-01-29 DIAGNOSIS — F0390 Unspecified dementia without behavioral disturbance: Secondary | ICD-10-CM | POA: Diagnosis not present

## 2020-01-29 DIAGNOSIS — F339 Major depressive disorder, recurrent, unspecified: Secondary | ICD-10-CM | POA: Diagnosis not present

## 2020-02-05 DIAGNOSIS — F0390 Unspecified dementia without behavioral disturbance: Secondary | ICD-10-CM | POA: Diagnosis not present

## 2020-02-05 DIAGNOSIS — F339 Major depressive disorder, recurrent, unspecified: Secondary | ICD-10-CM | POA: Diagnosis not present

## 2020-02-11 DIAGNOSIS — I4891 Unspecified atrial fibrillation: Secondary | ICD-10-CM | POA: Diagnosis not present

## 2020-02-11 DIAGNOSIS — E785 Hyperlipidemia, unspecified: Secondary | ICD-10-CM | POA: Diagnosis not present

## 2020-02-11 DIAGNOSIS — Z79899 Other long term (current) drug therapy: Secondary | ICD-10-CM | POA: Diagnosis not present

## 2020-02-11 DIAGNOSIS — G629 Polyneuropathy, unspecified: Secondary | ICD-10-CM | POA: Diagnosis not present

## 2020-02-11 DIAGNOSIS — G250 Essential tremor: Secondary | ICD-10-CM | POA: Diagnosis not present

## 2020-02-11 DIAGNOSIS — F0390 Unspecified dementia without behavioral disturbance: Secondary | ICD-10-CM | POA: Diagnosis not present

## 2020-02-12 DIAGNOSIS — F0390 Unspecified dementia without behavioral disturbance: Secondary | ICD-10-CM | POA: Diagnosis not present

## 2020-02-12 DIAGNOSIS — F339 Major depressive disorder, recurrent, unspecified: Secondary | ICD-10-CM | POA: Diagnosis not present

## 2020-02-19 DIAGNOSIS — F339 Major depressive disorder, recurrent, unspecified: Secondary | ICD-10-CM | POA: Diagnosis not present

## 2020-02-19 DIAGNOSIS — F0390 Unspecified dementia without behavioral disturbance: Secondary | ICD-10-CM | POA: Diagnosis not present

## 2020-02-26 DIAGNOSIS — F0390 Unspecified dementia without behavioral disturbance: Secondary | ICD-10-CM | POA: Diagnosis not present

## 2020-02-26 DIAGNOSIS — F339 Major depressive disorder, recurrent, unspecified: Secondary | ICD-10-CM | POA: Diagnosis not present

## 2020-03-02 DIAGNOSIS — I10 Essential (primary) hypertension: Secondary | ICD-10-CM | POA: Diagnosis not present

## 2020-03-02 DIAGNOSIS — Z79899 Other long term (current) drug therapy: Secondary | ICD-10-CM | POA: Diagnosis not present

## 2020-03-04 DIAGNOSIS — F0390 Unspecified dementia without behavioral disturbance: Secondary | ICD-10-CM | POA: Diagnosis not present

## 2020-03-04 DIAGNOSIS — F339 Major depressive disorder, recurrent, unspecified: Secondary | ICD-10-CM | POA: Diagnosis not present

## 2020-03-10 DIAGNOSIS — M069 Rheumatoid arthritis, unspecified: Secondary | ICD-10-CM | POA: Diagnosis not present

## 2020-03-10 DIAGNOSIS — I10 Essential (primary) hypertension: Secondary | ICD-10-CM | POA: Diagnosis not present

## 2020-03-10 DIAGNOSIS — I4891 Unspecified atrial fibrillation: Secondary | ICD-10-CM | POA: Diagnosis not present

## 2020-03-10 DIAGNOSIS — Z79899 Other long term (current) drug therapy: Secondary | ICD-10-CM | POA: Diagnosis not present

## 2020-03-10 DIAGNOSIS — G603 Idiopathic progressive neuropathy: Secondary | ICD-10-CM | POA: Diagnosis not present

## 2020-03-10 DIAGNOSIS — F3341 Major depressive disorder, recurrent, in partial remission: Secondary | ICD-10-CM | POA: Diagnosis not present

## 2020-03-11 DIAGNOSIS — F339 Major depressive disorder, recurrent, unspecified: Secondary | ICD-10-CM | POA: Diagnosis not present

## 2020-03-11 DIAGNOSIS — F0390 Unspecified dementia without behavioral disturbance: Secondary | ICD-10-CM | POA: Diagnosis not present

## 2020-03-15 IMAGING — CT CT ABD-PELV W/ CM
2 of 5 series · 17 of 46 positions shown, 19 images · IV contrast (APPLIED)
Comparison: 01/21/2016

CLINICAL DATA: Acute abdominal pain

EXAM:
CT ABDOMEN AND PELVIS WITH CONTRAST
TECHNIQUE: Multidetector CT imaging of the abdomen and pelvis was performed
using the standard protocol following bolus administration of
intravenous contrast.
CONTRAST:  75mL Z09JRE-LQK IOPAMIDOL (Z09JRE-LQK) INJECTION 76%

[Series 2: routine abd/pel with · axial · 0.82mm/px · z∈[-475,-90]mm · 14 of 87 slices shown, 16 images]
[im 5/87  soft-tissue]
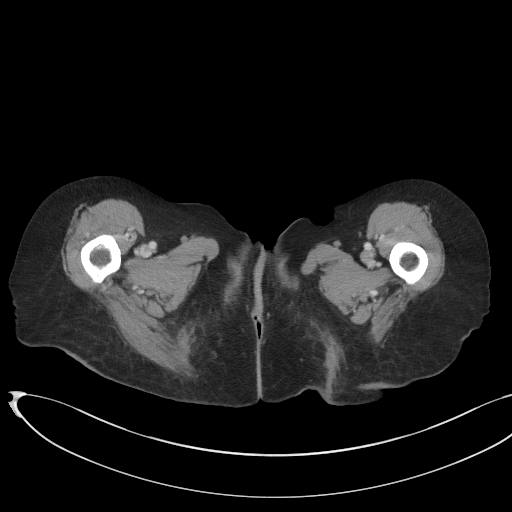
[im 5/87  bone]
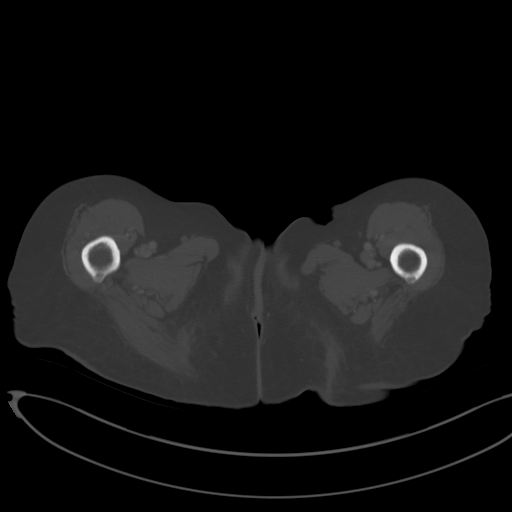
[im 10/87  soft-tissue]
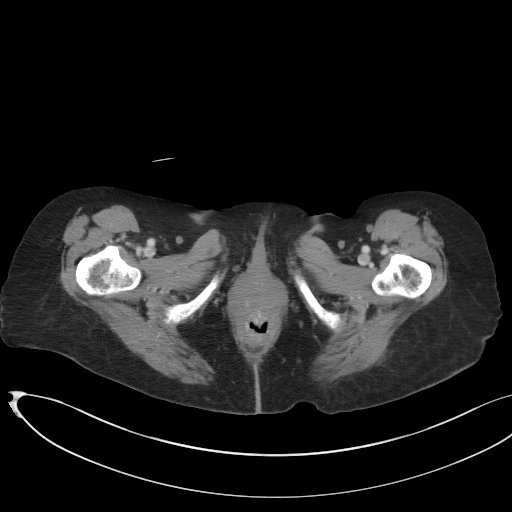
[im 19/87  soft-tissue]
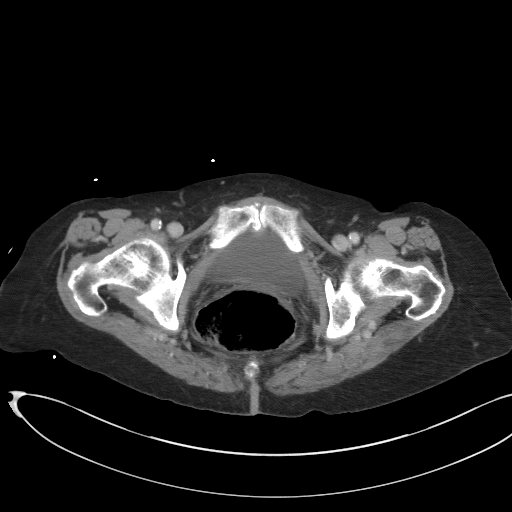
[im 23/87  soft-tissue]
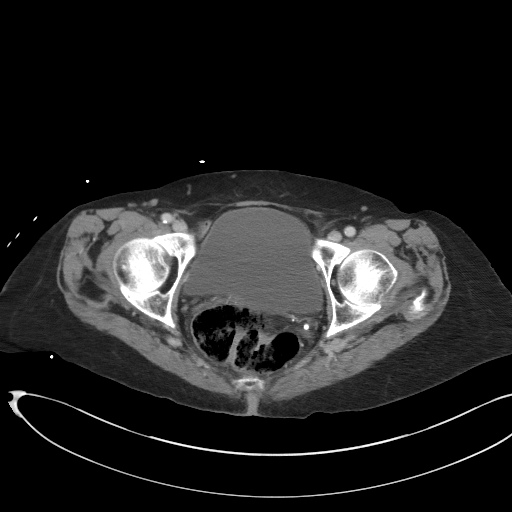
[im 28/87  soft-tissue]
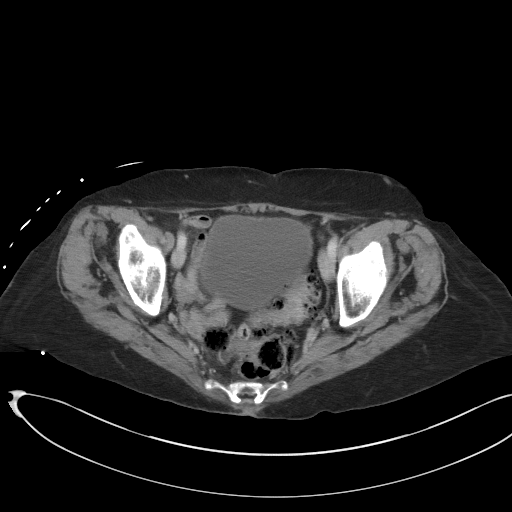
[im 37/87  soft-tissue]
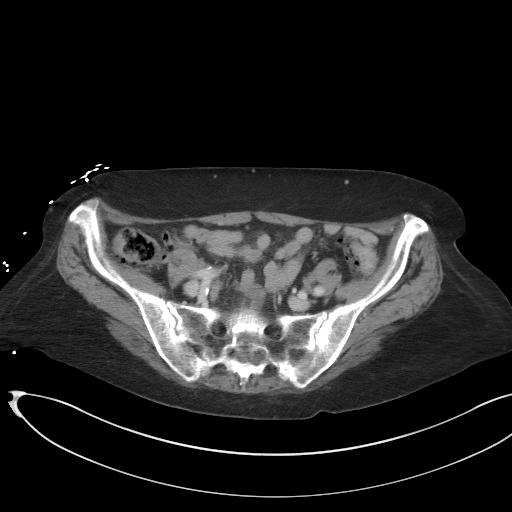
[im 41/87  soft-tissue]
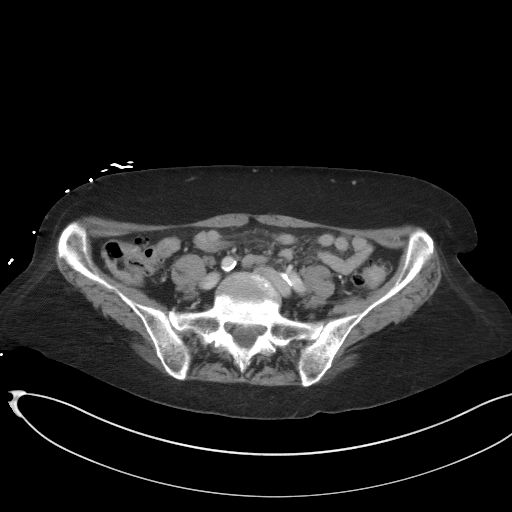
[im 46/87  soft-tissue]
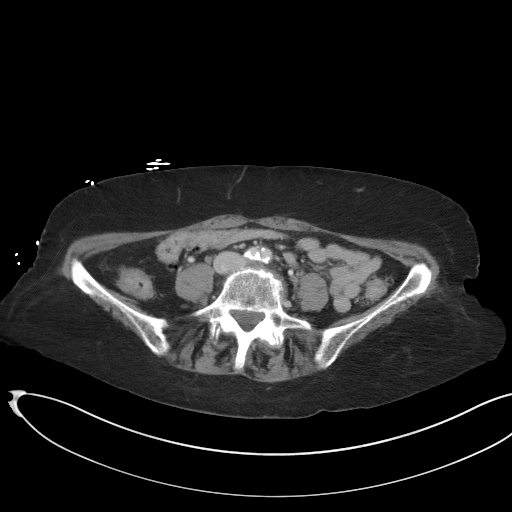
[im 50/87  soft-tissue]
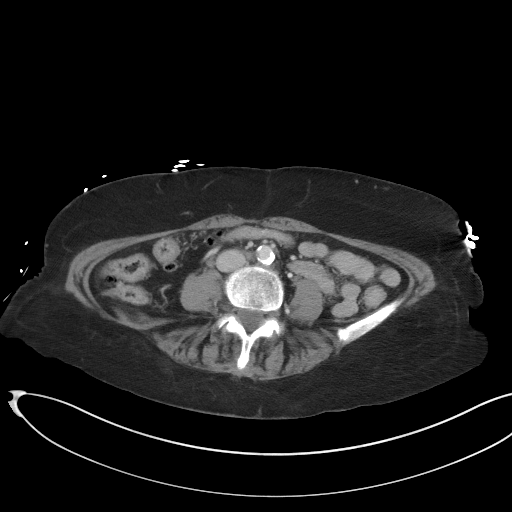
[im 50/87  bone]
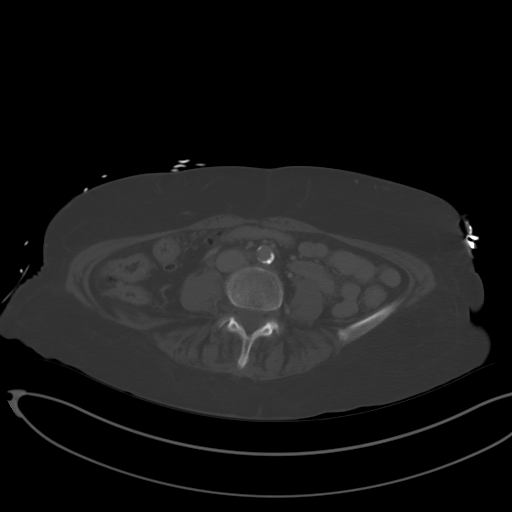
[im 59/87  soft-tissue]
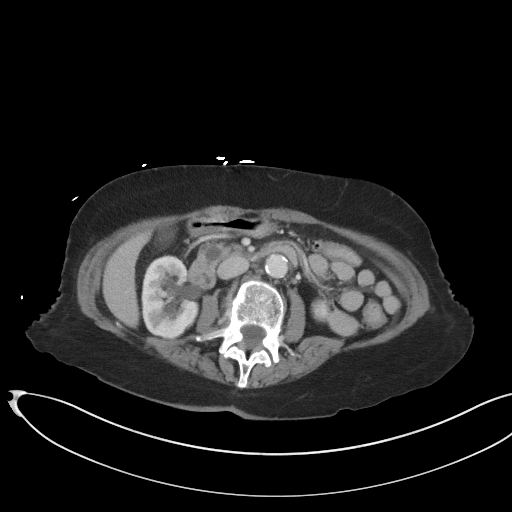
[im 64/87  soft-tissue]
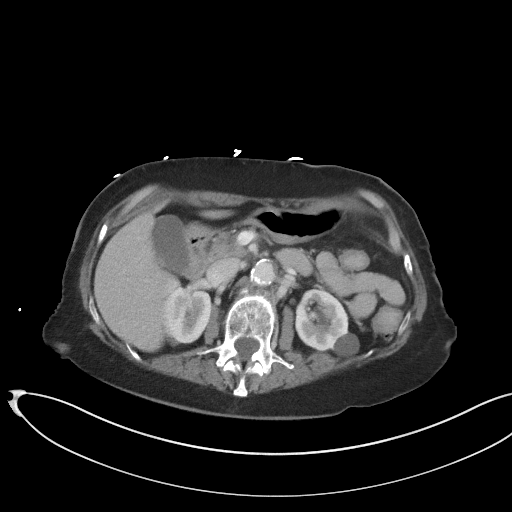
[im 68/87  soft-tissue]
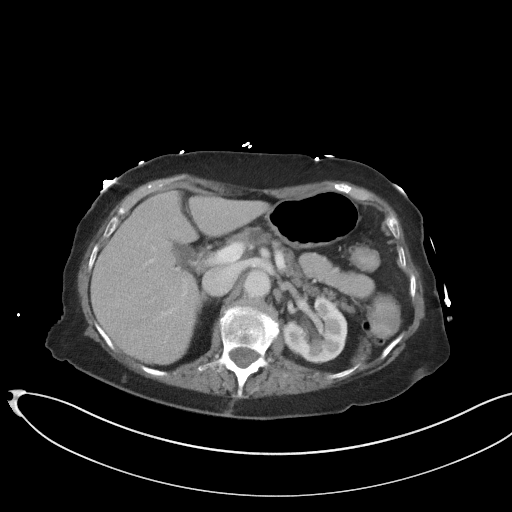
[im 77/87  soft-tissue]
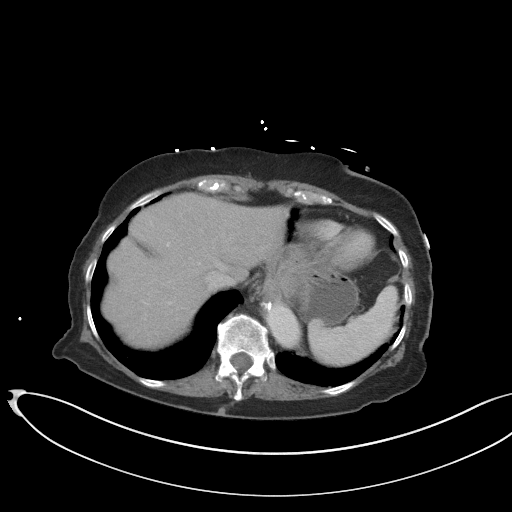
[im 82/87  soft-tissue]
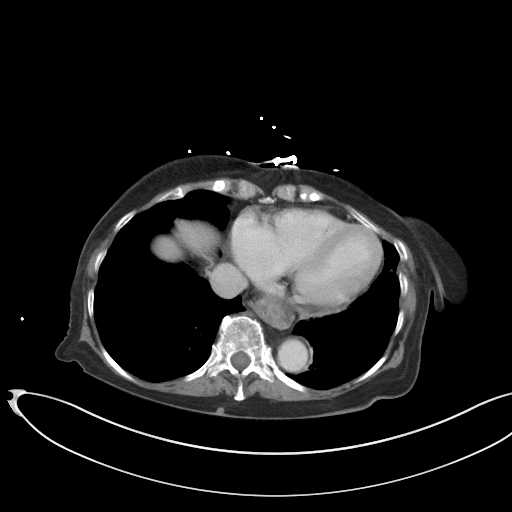

[Series 5: coronal st · coronal · 0.87mm/px · 3 of 69 slices shown]
[im 23/69  soft-tissue]
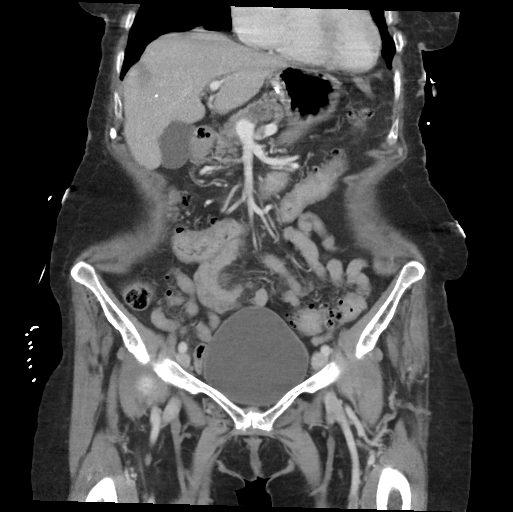
[im 31/69  soft-tissue]
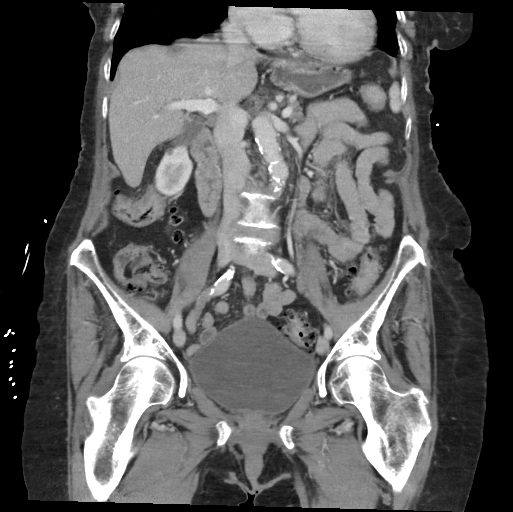
[im 38/69  soft-tissue]
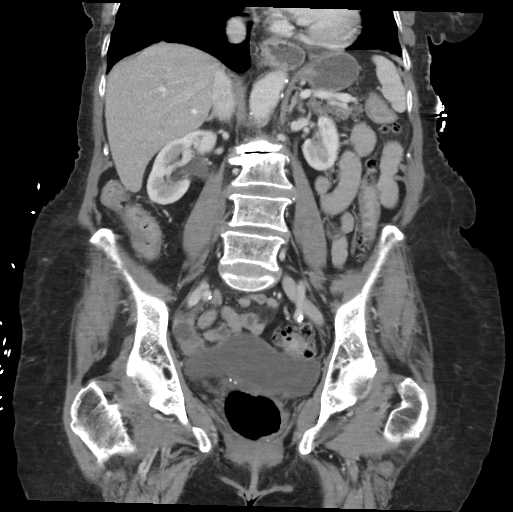

[17 of 46 positions shown; findings below may reference images not displayed]

FINDINGS: Lower chest: No acute abnormality.

Hepatobiliary: Scattered small hepatic cysts are noted. The
gallbladder is within normal limits.

Pancreas: Rounded cystic lesion is again noted in the region of the
pancreatic head measuring approximately 11 mm and relatively stable
from the prior study. No other pancreatic abnormality is seen.

Spleen: Normal in size without focal abnormality.

Adrenals/Urinary Tract: Adrenal glands are within normal limits
bilaterally. Kidneys are well visualized and demonstrate no renal
calculi. Exophytic cyst is noted arising from the lower pole of the
left kidney stable from the prior exam. No obstructive changes are
seen. The bladder is well distended.

Stomach/Bowel: Diverticular change of the colon is noted with some
smooth muscle hypertrophy. No findings to suggest diverticulitis are
seen. No obstructive or inflammatory changes of the bowel are noted.
The appendix is within normal limits. Sliding-type hiatal hernia is
noted stable from the previous exam.

Vascular/Lymphatic: Aortic atherosclerosis. No enlarged abdominal or
pelvic lymph nodes.

Reproductive: Status post hysterectomy. No adnexal masses.

Other: No abdominal wall hernia or abnormality. No abdominopelvic
ascites.

Musculoskeletal: Mild degenerative changes of lumbar spine are
noted.
IMPRESSION: Stable cystic changes within the liver, pancreas and left kidney.

Diverticular change without diverticulitis.

No acute abnormality noted.

## 2020-03-17 DIAGNOSIS — Z79899 Other long term (current) drug therapy: Secondary | ICD-10-CM | POA: Diagnosis not present

## 2020-03-17 DIAGNOSIS — R42 Dizziness and giddiness: Secondary | ICD-10-CM | POA: Diagnosis not present

## 2020-03-17 DIAGNOSIS — N3000 Acute cystitis without hematuria: Secondary | ICD-10-CM | POA: Diagnosis not present

## 2020-03-17 DIAGNOSIS — F339 Major depressive disorder, recurrent, unspecified: Secondary | ICD-10-CM | POA: Diagnosis not present

## 2020-03-17 DIAGNOSIS — N390 Urinary tract infection, site not specified: Secondary | ICD-10-CM | POA: Diagnosis not present

## 2020-03-17 DIAGNOSIS — M069 Rheumatoid arthritis, unspecified: Secondary | ICD-10-CM | POA: Diagnosis not present

## 2020-03-17 DIAGNOSIS — R296 Repeated falls: Secondary | ICD-10-CM | POA: Diagnosis not present

## 2020-03-18 DIAGNOSIS — F339 Major depressive disorder, recurrent, unspecified: Secondary | ICD-10-CM | POA: Diagnosis not present

## 2020-03-18 DIAGNOSIS — F0390 Unspecified dementia without behavioral disturbance: Secondary | ICD-10-CM | POA: Diagnosis not present

## 2020-03-23 DIAGNOSIS — G629 Polyneuropathy, unspecified: Secondary | ICD-10-CM | POA: Diagnosis not present

## 2020-03-23 DIAGNOSIS — I10 Essential (primary) hypertension: Secondary | ICD-10-CM | POA: Diagnosis not present

## 2020-03-23 DIAGNOSIS — I4891 Unspecified atrial fibrillation: Secondary | ICD-10-CM | POA: Diagnosis not present

## 2020-03-23 DIAGNOSIS — Z79899 Other long term (current) drug therapy: Secondary | ICD-10-CM | POA: Diagnosis not present

## 2020-03-23 DIAGNOSIS — H109 Unspecified conjunctivitis: Secondary | ICD-10-CM | POA: Diagnosis not present

## 2020-03-23 DIAGNOSIS — J449 Chronic obstructive pulmonary disease, unspecified: Secondary | ICD-10-CM | POA: Diagnosis not present

## 2020-03-23 DIAGNOSIS — M810 Age-related osteoporosis without current pathological fracture: Secondary | ICD-10-CM | POA: Diagnosis not present

## 2020-03-26 DIAGNOSIS — I4891 Unspecified atrial fibrillation: Secondary | ICD-10-CM | POA: Diagnosis not present

## 2020-03-26 DIAGNOSIS — G629 Polyneuropathy, unspecified: Secondary | ICD-10-CM | POA: Diagnosis not present

## 2020-03-26 DIAGNOSIS — I10 Essential (primary) hypertension: Secondary | ICD-10-CM | POA: Diagnosis not present

## 2020-03-26 DIAGNOSIS — R410 Disorientation, unspecified: Secondary | ICD-10-CM | POA: Diagnosis not present

## 2020-04-01 DIAGNOSIS — F0390 Unspecified dementia without behavioral disturbance: Secondary | ICD-10-CM | POA: Diagnosis not present

## 2020-04-01 DIAGNOSIS — Z79899 Other long term (current) drug therapy: Secondary | ICD-10-CM | POA: Diagnosis not present

## 2020-04-01 DIAGNOSIS — N390 Urinary tract infection, site not specified: Secondary | ICD-10-CM | POA: Diagnosis not present

## 2020-04-01 DIAGNOSIS — F339 Major depressive disorder, recurrent, unspecified: Secondary | ICD-10-CM | POA: Diagnosis not present

## 2020-04-15 DIAGNOSIS — F339 Major depressive disorder, recurrent, unspecified: Secondary | ICD-10-CM | POA: Diagnosis not present

## 2020-04-15 DIAGNOSIS — F0390 Unspecified dementia without behavioral disturbance: Secondary | ICD-10-CM | POA: Diagnosis not present

## 2020-04-21 DIAGNOSIS — M069 Rheumatoid arthritis, unspecified: Secondary | ICD-10-CM | POA: Diagnosis not present

## 2020-04-21 DIAGNOSIS — G894 Chronic pain syndrome: Secondary | ICD-10-CM | POA: Diagnosis not present

## 2020-04-21 DIAGNOSIS — I10 Essential (primary) hypertension: Secondary | ICD-10-CM | POA: Diagnosis not present

## 2020-04-21 DIAGNOSIS — Z7982 Long term (current) use of aspirin: Secondary | ICD-10-CM | POA: Diagnosis not present

## 2020-04-21 DIAGNOSIS — G250 Essential tremor: Secondary | ICD-10-CM | POA: Diagnosis not present

## 2020-04-21 DIAGNOSIS — I4891 Unspecified atrial fibrillation: Secondary | ICD-10-CM | POA: Diagnosis not present

## 2020-04-21 DIAGNOSIS — J449 Chronic obstructive pulmonary disease, unspecified: Secondary | ICD-10-CM | POA: Diagnosis not present

## 2020-04-21 DIAGNOSIS — R269 Unspecified abnormalities of gait and mobility: Secondary | ICD-10-CM | POA: Diagnosis not present

## 2020-04-21 DIAGNOSIS — F0390 Unspecified dementia without behavioral disturbance: Secondary | ICD-10-CM | POA: Diagnosis not present

## 2020-04-22 DIAGNOSIS — F0390 Unspecified dementia without behavioral disturbance: Secondary | ICD-10-CM | POA: Diagnosis not present

## 2020-04-22 DIAGNOSIS — F339 Major depressive disorder, recurrent, unspecified: Secondary | ICD-10-CM | POA: Diagnosis not present

## 2020-04-28 DIAGNOSIS — N390 Urinary tract infection, site not specified: Secondary | ICD-10-CM | POA: Diagnosis not present

## 2020-04-28 DIAGNOSIS — Z79899 Other long term (current) drug therapy: Secondary | ICD-10-CM | POA: Diagnosis not present

## 2020-04-29 DIAGNOSIS — F0390 Unspecified dementia without behavioral disturbance: Secondary | ICD-10-CM | POA: Diagnosis not present

## 2020-04-29 DIAGNOSIS — F339 Major depressive disorder, recurrent, unspecified: Secondary | ICD-10-CM | POA: Diagnosis not present

## 2020-05-13 DIAGNOSIS — J449 Chronic obstructive pulmonary disease, unspecified: Secondary | ICD-10-CM | POA: Diagnosis not present

## 2020-05-13 DIAGNOSIS — F339 Major depressive disorder, recurrent, unspecified: Secondary | ICD-10-CM | POA: Diagnosis not present

## 2020-05-13 DIAGNOSIS — I4891 Unspecified atrial fibrillation: Secondary | ICD-10-CM | POA: Diagnosis not present

## 2020-05-13 DIAGNOSIS — F0390 Unspecified dementia without behavioral disturbance: Secondary | ICD-10-CM | POA: Diagnosis not present

## 2020-05-14 ENCOUNTER — Emergency Department
Admission: EM | Admit: 2020-05-14 | Discharge: 2020-05-14 | Disposition: A | Discharge status: Home / Self Care | Payer: Medicare HMO | Attending: Emergency Medicine | Admitting: Emergency Medicine

## 2020-05-14 ENCOUNTER — Emergency Department: Payer: Medicare HMO

## 2020-05-14 ENCOUNTER — Other Ambulatory Visit: Payer: Self-pay

## 2020-05-14 DIAGNOSIS — J449 Chronic obstructive pulmonary disease, unspecified: Secondary | ICD-10-CM | POA: Insufficient documentation

## 2020-05-14 DIAGNOSIS — F0280 Dementia in other diseases classified elsewhere without behavioral disturbance: Secondary | ICD-10-CM | POA: Diagnosis not present

## 2020-05-14 DIAGNOSIS — I4891 Unspecified atrial fibrillation: Secondary | ICD-10-CM | POA: Diagnosis not present

## 2020-05-14 DIAGNOSIS — R1110 Vomiting, unspecified: Secondary | ICD-10-CM | POA: Diagnosis not present

## 2020-05-14 DIAGNOSIS — F0390 Unspecified dementia without behavioral disturbance: Secondary | ICD-10-CM | POA: Diagnosis not present

## 2020-05-14 DIAGNOSIS — Z79899 Other long term (current) drug therapy: Secondary | ICD-10-CM | POA: Diagnosis not present

## 2020-05-14 DIAGNOSIS — I10 Essential (primary) hypertension: Secondary | ICD-10-CM | POA: Insufficient documentation

## 2020-05-14 DIAGNOSIS — Z87891 Personal history of nicotine dependence: Secondary | ICD-10-CM | POA: Diagnosis not present

## 2020-05-14 DIAGNOSIS — G629 Polyneuropathy, unspecified: Secondary | ICD-10-CM | POA: Diagnosis not present

## 2020-05-14 DIAGNOSIS — G309 Alzheimer's disease, unspecified: Secondary | ICD-10-CM | POA: Diagnosis not present

## 2020-05-14 DIAGNOSIS — T50904A Poisoning by unspecified drugs, medicaments and biological substances, undetermined, initial encounter: Secondary | ICD-10-CM | POA: Diagnosis not present

## 2020-05-14 DIAGNOSIS — K5901 Slow transit constipation: Secondary | ICD-10-CM | POA: Diagnosis not present

## 2020-05-14 DIAGNOSIS — Z139 Encounter for screening, unspecified: Secondary | ICD-10-CM | POA: Diagnosis not present

## 2020-05-14 DIAGNOSIS — R0902 Hypoxemia: Secondary | ICD-10-CM | POA: Diagnosis not present

## 2020-05-14 DIAGNOSIS — Z09 Encounter for follow-up examination after completed treatment for conditions other than malignant neoplasm: Secondary | ICD-10-CM | POA: Diagnosis not present

## 2020-05-14 MED ORDER — POLYETHYLENE GLYCOL 3350 17 GM/SCOOP PO POWD: ORAL | 0 refills | Status: DC

## 2020-05-14 MED ORDER — SENNOSIDES-DOCUSATE SODIUM 8.6-50 MG PO TABS: 2.0000 | ORAL_TABLET | Freq: Two times a day (BID) | ORAL | 0 refills | Status: AC

## 2020-05-14 NOTE — ED Notes (Signed)
Pt's daughter [name] updated on pt's status and plan of care

## 2020-05-14 NOTE — ED Provider Notes (Addendum)
[facility] Emergency Department Provider Note  ____________________________________________  Time seen: Approximately 9:43 AM  I have reviewed the triage vital signs and the nursing notes.   HISTORY  Chief Complaint Vomiting  Level 5 Caveat: Portions of the History and Physical including HPI and review of systems are unable to be completely obtained due to patient being a poor historian due to chronic dementia   HPI Victoria Esparza is a 86 y.o. female with a history of atrial fibrillation COPD hypertension dementia  who was in her usual state of health at her memory care today when she was found with a napkin on her lap.  There was a brown substance on it.  Staff were concerned that it was fecal material.  They did not know if she was eating the fecal material or if she had potentially vomited it up.  The patient denies any complaints.  Memory care staff elected to call EMS for ED evaluation instead of utilizing outpatient care.     Past Medical History:  Diagnosis Date  . A-fib ([place])   . Arthritis   . Cognitive dysfunction   . Colon adenoma   . COPD (chronic obstructive pulmonary disease) ([place])   . Depression   . Familial tremor   . HLD (hyperlipidemia)   . Hypertension   . Osteoarthritis   . Osteoporosis   . Ovarian cyst   . Psoriatic arthropathy ([place])   . Rotator cuff syndrome   . Sigmoid diverticulitis      Patient Active Problem List   Diagnosis Date Noted  . Psoriasis with arthropathy ([place]) 01/05/2016  . Benign essential HTN 09/08/2015  . Paroxysmal atrial fibrillation ([place]) 08/31/2015  . OP (osteoporosis) 04/20/2015     Past Surgical History:  Procedure Laterality Date  . ABDOMINAL HYSTERECTOMY    . COLONOSCOPY       Prior to Admission medications   Medication Sig Start Date End Date Taking? Authorizing Provider  albuterol (VENTOLIN HFA) 108 (90 Base) MCG/ACT inhaler Inhale 2 puffs into the lungs every 6 (six) hours as  needed for up to 7 days for wheezing or shortness of breath. 08/10/19 08/17/19  Arta Silence, MD  alendronate (FOSAMAX) 70 MG tablet Take 70 mg by mouth once a week. Reported on 01/22/2016    [provider]  aspirin 325 MG EC tablet Take 325 mg by mouth daily.    [provider]  calcium-vitamin D (SM CALCIUM 500/VITAMIN D3) 500-400 MG-UNIT tablet Take by mouth.    [provider]  cefpodoxime (VANTIN) 100 MG tablet Take 1 tablet (100 mg total) by mouth 2 (two) times daily. Patient not taking: Reported on 09/01/2019 04/24/18   Carrie Mew, MD  cephALEXin (KEFLEX) 500 MG capsule Take 1 capsule (500 mg total) by mouth 2 (two) times daily. 09/01/19   Harvest Dark, MD  citalopram (CELEXA) 10 MG tablet Take 10 mg by mouth daily.    [provider]  docusate sodium (COLACE) 100 MG capsule Take 100 mg by mouth daily.    [provider]  ENBREL SURECLICK 50 MG/ML injection Inject 0.8 mLs into the muscle once a week. 08/21/19   [provider]  estradiol (ESTRACE) 0.1 MG/GM vaginal cream INSERT 1 GRAM VAGINALLY 3 TIMES A WEEK. PEA SIZED AMOUNT PER URETHRA Patient not taking: Reported on 09/01/2019 02/14/17   Hollice Espy, MD  fluticasone [facility]) 50 MCG/ACT nasal spray Place 2 sprays into both nostrils daily.    [provider]  folic acid (FOLVITE) 1 MG tablet Take 1 mg by mouth daily.    [provider]  gabapentin (NEURONTIN) 300 MG capsule Take 300 mg by mouth daily. 08/17/19   [provider]  Melatonin 5 MG TABS Take 5 mg by mouth at bedtime as needed (sleep).    [provider]  Methotrexate, PF, 25 MG/0.4ML SOAJ Inject 0.8 mLs into the skin once a week. Monday 0800    [provider]  metoprolol tartrate (LOPRESSOR) 25 MG tablet Take 25 mg by mouth 2 (two) times daily.    [provider]  naproxen sodium (RA NAPROXEN SODIUM) 220 MG tablet Take by mouth. Reported on 01/22/2016     [provider]  Omega-3 Fatty Acids (FISH OIL PO) Take by mouth.    [provider]  ondansetron (ZOFRAN ODT) 4 MG disintegrating tablet Take 1 tablet (4 mg total) by mouth every 8 (eight) hours as needed for nausea or vomiting. Patient not taking: Reported on 09/01/2019 04/24/18   Carrie Mew, MD  polyethylene glycol powder [facility]) 17 GM/SCOOP powder 1 cap full in a full glass of water, two times a day for 3 days. 05/14/20   Carrie Mew, MD  senna-docusate (SENOKOT-S) 8.6-50 MG tablet Take 2 tablets by mouth 2 (two) times daily. 05/14/20   Carrie Mew, MD  traMADol (ULTRAM) 50 MG tablet Take 50 mg by mouth every 6 (six) hours as needed.    [provider]  vitamin B-12 (CYANOCOBALAMIN) 1000 MCG tablet Take 1,000 mcg by mouth daily.    [provider]     Allergies Patient has no known allergies.   Family History  Problem Relation Age of Onset  . Diabetes Mother   . Ovarian cancer Mother   . Lung cancer Father   . Aneurysm Brother   . Diabetes Brother   . Breast cancer Sister   . Colon cancer Sister   . Diabetes Sister   . Kidney disease Other        nephew  . Bladder Cancer Neg Hx     Social History Social History   Tobacco Use  . Smoking status: Former Smoker    Quit date: 05/26/1995    Years since quitting: 25.0  . Smokeless tobacco: Never Used  Substance Use Topics  . Alcohol use: No  . Drug use: Never    Review of Systems  Constitutional:   No fever or chills.  ENT:   No sore throat. No rhinorrhea. Cardiovascular:   No chest pain or syncope. Respiratory:   No dyspnea or cough. Gastrointestinal:   Negative for abdominal pain constipation or diarrhea.  Questionable vomiting Musculoskeletal:   Negative for focal pain or swelling All other systems reviewed and are negative except as documented above in ROS and HPI.  ____________________________________________   PHYSICAL EXAM:  VITAL SIGNS: ED  Triage Vitals  Enc Vitals Group     BP 05/14/20 0927 (!) 159/97     Pulse Rate 05/14/20 0927 81     Resp 05/14/20 0927 18     Temp 05/14/20 0927 98.3 F (36.8 C)     Temp Source 05/14/20 0927 Oral     SpO2 05/14/20 0927 100 %     Weight 05/14/20 0929 130 lb (59 kg)     Height 05/14/20 0929 5\' 7"  (1.702 m)     Head Circumference --      Peak Flow --      Pain Score 05/14/20 0929 0  Pain Loc --      Pain Edu? --      Excl. in [place]? --     Vital signs reviewed, nursing assessments reviewed.   Constitutional:   Alert and oriented. Non-toxic appearance.  Patient is clean without any emesis on the face or clothing Eyes:   Conjunctivae are normal. EOMI. PERRL. ENT      Head:   Normocephalic and atraumatic.      Nose:   Normal.      Mouth/Throat: Moist mucous membranes, no blood or fecal material or other residue in the oropharynx      Neck:   No meningismus. Full ROM. Hematological/Lymphatic/Immunilogical:   No cervical lymphadenopathy. Cardiovascular:   RRR. Symmetric bilateral radial and DP pulses.  No murmurs. Cap refill less than 2 seconds. Respiratory:   Normal respiratory effort without tachypnea/retractions. Breath sounds are clear and equal bilaterally. No wheezes/rales/rhonchi. Gastrointestinal:   Soft and nontender. Non distended. There is no CVA tenderness.  No rebound, rigidity, or guarding.  Musculoskeletal:   Normal range of motion in all extremities. No joint effusions.  No lower extremity tenderness.  No edema. Neurologic:   Normal speech and language.  Motor grossly intact. No acute focal neurologic deficits are appreciated.  Skin:    Skin is warm, dry and intact. No rash noted.  No petechiae, purpura, or bullae.  No residue on the skin or fingernails.  ____________________________________________    LABS (pertinent positives/negatives) (all labs ordered are listed, but only abnormal results are displayed) Labs Reviewed - No data to  display ____________________________________________   EKG    ____________________________________________    RADIOLOGY  No results found. EXAM: DG ABDOMEN ACUTE W/ 1V CHEST  COMPARISON:  Chest radiograph May 09, 2011; CT abdomen and pelvis Apr 24, 2018  FINDINGS: PA chest: Lungs are clear. Heart size and pulmonary vascularity are normal. No adenopathy. There is a focal hiatal hernia. No bone lesions.  Supine and upright abdomen: There is fairly diffuse stool throughout the colon. There is no bowel dilatation or air-fluid level to suggest bowel obstruction. No free air. Probable small phleboliths in the pelvis.  IMPRESSION: Fairly diffuse stool throughout colon. Question a degree of constipation. No bowel obstruction or free air.  Lungs clear.  Cardiac silhouette normal.  Hiatal hernia evident. ____________________________________________   PROCEDURES Procedures  ____________________________________________    CLINICAL IMPRESSION / ASSESSMENT AND PLAN / ED COURSE  Medications ordered in the ED: Medications - No data to display  Pertinent labs & imaging results that were available during my care of the patient were reviewed by me and considered in my medical decision making (see chart for details).  KRYSTEL MEDUNA was evaluated in Emergency Department on 05/23/2020 for the symptoms described in the history of present illness. She was evaluated in the context of the global COVID-19 pandemic, which necessitated consideration that the patient might be at risk for infection with the SARS-CoV-2 virus that causes COVID-19. Institutional protocols and algorithms that pertain to the evaluation of patients at risk for COVID-19 are in a state of rapid change based on information released by regulatory bodies including the CDC and federal and state organizations. These policies and algorithms were followed during the patient's care in the ED.   Patient sent to the ED  for evaluation of suspected vomiting x1 versus eating stool.  On exam, there is no evidence of any residue on the patient to suggest handling stool or vomiting.  She has no complaints, exam is  entirely benign and reassuring, abdominal exam normal.  Vital signs are normal.  The patient is in good spirits and talkative.  No apparent emergency medical condition or acute illness.  I will obtain an abdominal x-ray series to evaluate for signs of subclinical bowel obstruction or aspiration.  If reassuring, I think patient is stable for discharge back to her memory care for continued outpatient management.    ----------------------------------------- 1:15 PM on 05/19/2020 -----------------------------------------  Late entry progress note.  X-rays were benign, patient remained in good spirits and tolerating oral intake, discharged back to her care facility for outpatient follow-up. rx provided for constipation care.       ____________________________________________   FINAL CLINICAL IMPRESSION(S) / ED DIAGNOSES    Final diagnoses:  Chronic dementia without behavioral disturbance ([place])  Encounter for medical screening examination  Atrial fibrillation, unspecified type ([place])  Chronic obstructive pulmonary disease, unspecified COPD type [facility])     ED Discharge Orders         Ordered    senna-docusate (SENOKOT-S) 8.6-50 MG tablet  2 times daily     05/14/20 1003    polyethylene glycol powder (GLYCOLAX/MIRALAX) 17 GM/SCOOP powder     05/14/20 1003          Portions of this note were generated with dragon dictation software. Dictation errors may occur despite best attempts at proofreading.   Carrie Mew, MD 05/19/20 [place]    Carrie Mew, MD 05/23/20 [phone]

## 2020-05-14 NOTE — Discharge Instructions (Signed)
Your x-rays show constipation.  No signs of bowel obstruction or pneumonia.  Start taking stool softeners and laxatives, and please follow-up with primary care for continued monitoring of your symptoms.

## 2020-05-14 NOTE — ED Notes (Signed)
Spoke with pt's daughter Manuela Schwartz to update on status of discharge. Manuela Schwartz states she will be here in 30 mins to pick pt up and take her back to [facility]

## 2020-05-14 NOTE — ED Notes (Signed)
Report given to [place] at [facility]

## 2020-05-14 NOTE — ED Triage Notes (Signed)
Pt arrives via [facility] from [facility] for staff reports that patient had a napkin in her lap with stool on it. Staff unsure if pt vomited stool or if she was eating stool. Pt with hx of dementia and cannot tell this RN what happened. Pt in NAD.

## 2020-05-20 DIAGNOSIS — F339 Major depressive disorder, recurrent, unspecified: Secondary | ICD-10-CM | POA: Diagnosis not present

## 2020-05-20 DIAGNOSIS — F0390 Unspecified dementia without behavioral disturbance: Secondary | ICD-10-CM | POA: Diagnosis not present

## 2020-05-27 DIAGNOSIS — F0390 Unspecified dementia without behavioral disturbance: Secondary | ICD-10-CM | POA: Diagnosis not present

## 2020-05-27 DIAGNOSIS — F339 Major depressive disorder, recurrent, unspecified: Secondary | ICD-10-CM | POA: Diagnosis not present

## 2020-06-02 DIAGNOSIS — L409 Psoriasis, unspecified: Secondary | ICD-10-CM | POA: Diagnosis not present

## 2020-06-02 DIAGNOSIS — M810 Age-related osteoporosis without current pathological fracture: Secondary | ICD-10-CM | POA: Diagnosis not present

## 2020-06-02 DIAGNOSIS — M0579 Rheumatoid arthritis with rheumatoid factor of multiple sites without organ or systems involvement: Secondary | ICD-10-CM | POA: Diagnosis not present

## 2020-06-03 DIAGNOSIS — F0390 Unspecified dementia without behavioral disturbance: Secondary | ICD-10-CM | POA: Diagnosis not present

## 2020-06-03 DIAGNOSIS — F339 Major depressive disorder, recurrent, unspecified: Secondary | ICD-10-CM | POA: Diagnosis not present

## 2020-06-09 DIAGNOSIS — F5101 Primary insomnia: Secondary | ICD-10-CM | POA: Diagnosis not present

## 2020-06-09 DIAGNOSIS — F0390 Unspecified dementia without behavioral disturbance: Secondary | ICD-10-CM | POA: Diagnosis not present

## 2020-06-09 DIAGNOSIS — M069 Rheumatoid arthritis, unspecified: Secondary | ICD-10-CM | POA: Diagnosis not present

## 2020-06-09 DIAGNOSIS — I10 Essential (primary) hypertension: Secondary | ICD-10-CM | POA: Diagnosis not present

## 2020-06-09 DIAGNOSIS — Z7982 Long term (current) use of aspirin: Secondary | ICD-10-CM | POA: Diagnosis not present

## 2020-06-09 DIAGNOSIS — I4891 Unspecified atrial fibrillation: Secondary | ICD-10-CM | POA: Diagnosis not present

## 2020-06-09 DIAGNOSIS — R269 Unspecified abnormalities of gait and mobility: Secondary | ICD-10-CM | POA: Diagnosis not present

## 2020-06-09 DIAGNOSIS — J449 Chronic obstructive pulmonary disease, unspecified: Secondary | ICD-10-CM | POA: Diagnosis not present

## 2020-06-25 DIAGNOSIS — J449 Chronic obstructive pulmonary disease, unspecified: Secondary | ICD-10-CM | POA: Diagnosis not present

## 2020-06-25 DIAGNOSIS — Z7982 Long term (current) use of aspirin: Secondary | ICD-10-CM | POA: Diagnosis not present

## 2020-06-25 DIAGNOSIS — I4891 Unspecified atrial fibrillation: Secondary | ICD-10-CM | POA: Diagnosis not present

## 2020-06-25 DIAGNOSIS — F0390 Unspecified dementia without behavioral disturbance: Secondary | ICD-10-CM | POA: Diagnosis not present

## 2020-06-25 DIAGNOSIS — G250 Essential tremor: Secondary | ICD-10-CM | POA: Diagnosis not present

## 2020-06-25 DIAGNOSIS — M069 Rheumatoid arthritis, unspecified: Secondary | ICD-10-CM | POA: Diagnosis not present

## 2020-06-25 DIAGNOSIS — R269 Unspecified abnormalities of gait and mobility: Secondary | ICD-10-CM | POA: Diagnosis not present

## 2020-06-25 DIAGNOSIS — G894 Chronic pain syndrome: Secondary | ICD-10-CM | POA: Diagnosis not present

## 2020-07-07 DIAGNOSIS — I10 Essential (primary) hypertension: Secondary | ICD-10-CM | POA: Diagnosis not present

## 2020-07-07 DIAGNOSIS — G629 Polyneuropathy, unspecified: Secondary | ICD-10-CM | POA: Diagnosis not present

## 2020-07-07 DIAGNOSIS — R269 Unspecified abnormalities of gait and mobility: Secondary | ICD-10-CM | POA: Diagnosis not present

## 2020-07-07 DIAGNOSIS — G309 Alzheimer's disease, unspecified: Secondary | ICD-10-CM | POA: Diagnosis not present

## 2020-07-07 DIAGNOSIS — Z79899 Other long term (current) drug therapy: Secondary | ICD-10-CM | POA: Diagnosis not present

## 2020-07-07 DIAGNOSIS — Z9181 History of falling: Secondary | ICD-10-CM | POA: Diagnosis not present

## 2020-07-07 DIAGNOSIS — I4891 Unspecified atrial fibrillation: Secondary | ICD-10-CM | POA: Diagnosis not present

## 2020-07-09 DIAGNOSIS — G629 Polyneuropathy, unspecified: Secondary | ICD-10-CM | POA: Diagnosis not present

## 2020-07-09 DIAGNOSIS — I739 Peripheral vascular disease, unspecified: Secondary | ICD-10-CM | POA: Diagnosis not present

## 2020-07-09 DIAGNOSIS — B351 Tinea unguium: Secondary | ICD-10-CM | POA: Diagnosis not present

## 2020-07-09 DIAGNOSIS — L84 Corns and callosities: Secondary | ICD-10-CM | POA: Diagnosis not present

## 2020-07-09 DIAGNOSIS — S90212A Contusion of left great toe with damage to nail, initial encounter: Secondary | ICD-10-CM | POA: Diagnosis not present

## 2020-07-28 DIAGNOSIS — F0390 Unspecified dementia without behavioral disturbance: Secondary | ICD-10-CM | POA: Diagnosis not present

## 2020-07-28 DIAGNOSIS — Z9181 History of falling: Secondary | ICD-10-CM | POA: Diagnosis not present

## 2020-07-28 DIAGNOSIS — I4891 Unspecified atrial fibrillation: Secondary | ICD-10-CM | POA: Diagnosis not present

## 2020-07-28 DIAGNOSIS — G629 Polyneuropathy, unspecified: Secondary | ICD-10-CM | POA: Diagnosis not present

## 2020-07-28 DIAGNOSIS — G250 Essential tremor: Secondary | ICD-10-CM | POA: Diagnosis not present

## 2020-07-28 DIAGNOSIS — Z79899 Other long term (current) drug therapy: Secondary | ICD-10-CM | POA: Diagnosis not present

## 2020-07-28 DIAGNOSIS — K5900 Constipation, unspecified: Secondary | ICD-10-CM | POA: Diagnosis not present

## 2020-08-11 DIAGNOSIS — I4891 Unspecified atrial fibrillation: Secondary | ICD-10-CM | POA: Diagnosis not present

## 2020-08-11 DIAGNOSIS — J449 Chronic obstructive pulmonary disease, unspecified: Secondary | ICD-10-CM | POA: Diagnosis not present

## 2020-08-11 DIAGNOSIS — E785 Hyperlipidemia, unspecified: Secondary | ICD-10-CM | POA: Diagnosis not present

## 2020-08-11 DIAGNOSIS — M069 Rheumatoid arthritis, unspecified: Secondary | ICD-10-CM | POA: Diagnosis not present

## 2020-08-11 DIAGNOSIS — Z79899 Other long term (current) drug therapy: Secondary | ICD-10-CM | POA: Diagnosis not present

## 2020-08-11 DIAGNOSIS — G629 Polyneuropathy, unspecified: Secondary | ICD-10-CM | POA: Diagnosis not present

## 2020-08-11 DIAGNOSIS — G250 Essential tremor: Secondary | ICD-10-CM | POA: Diagnosis not present

## 2020-08-11 DIAGNOSIS — F0390 Unspecified dementia without behavioral disturbance: Secondary | ICD-10-CM | POA: Diagnosis not present

## 2020-08-17 DIAGNOSIS — I4891 Unspecified atrial fibrillation: Secondary | ICD-10-CM | POA: Diagnosis not present

## 2020-08-19 ENCOUNTER — Emergency Department: Payer: Medicare HMO | Admitting: Anesthesiology

## 2020-08-19 ENCOUNTER — Emergency Department: Payer: Medicare HMO

## 2020-08-19 ENCOUNTER — Emergency Department
Admission: EM | Admit: 2020-08-19 | Discharge: 2020-08-20 | Disposition: A | Discharge status: Skilled Nursing Facility | Payer: Medicare HMO | Attending: Emergency Medicine | Admitting: Emergency Medicine

## 2020-08-19 ENCOUNTER — Other Ambulatory Visit: Payer: Self-pay

## 2020-08-19 ENCOUNTER — Encounter
Admission: EM | Disposition: A | Discharge status: Skilled Nursing Facility | Payer: Self-pay | Source: Home / Self Care | Attending: Emergency Medicine

## 2020-08-19 DIAGNOSIS — Y939 Activity, unspecified: Secondary | ICD-10-CM | POA: Insufficient documentation

## 2020-08-19 DIAGNOSIS — Z7982 Long term (current) use of aspirin: Secondary | ICD-10-CM | POA: Insufficient documentation

## 2020-08-19 DIAGNOSIS — Z87891 Personal history of nicotine dependence: Secondary | ICD-10-CM | POA: Diagnosis not present

## 2020-08-19 DIAGNOSIS — Y929 Unspecified place or not applicable: Secondary | ICD-10-CM | POA: Diagnosis not present

## 2020-08-19 DIAGNOSIS — T17920A Food in respiratory tract, part unspecified causing asphyxiation, initial encounter: Secondary | ICD-10-CM | POA: Diagnosis not present

## 2020-08-19 DIAGNOSIS — I10 Essential (primary) hypertension: Secondary | ICD-10-CM | POA: Diagnosis not present

## 2020-08-19 DIAGNOSIS — Z79899 Other long term (current) drug therapy: Secondary | ICD-10-CM | POA: Insufficient documentation

## 2020-08-19 DIAGNOSIS — J90 Pleural effusion, not elsewhere classified: Secondary | ICD-10-CM | POA: Diagnosis not present

## 2020-08-19 DIAGNOSIS — R0689 Other abnormalities of breathing: Secondary | ICD-10-CM | POA: Diagnosis not present

## 2020-08-19 DIAGNOSIS — J449 Chronic obstructive pulmonary disease, unspecified: Secondary | ICD-10-CM | POA: Diagnosis not present

## 2020-08-19 DIAGNOSIS — R1319 Other dysphagia: Secondary | ICD-10-CM | POA: Diagnosis not present

## 2020-08-19 DIAGNOSIS — X58XXXA Exposure to other specified factors, initial encounter: Secondary | ICD-10-CM | POA: Insufficient documentation

## 2020-08-19 DIAGNOSIS — J811 Chronic pulmonary edema: Secondary | ICD-10-CM | POA: Diagnosis not present

## 2020-08-19 DIAGNOSIS — T18128A Food in esophagus causing other injury, initial encounter: Secondary | ICD-10-CM | POA: Diagnosis not present

## 2020-08-19 DIAGNOSIS — Z20822 Contact with and (suspected) exposure to covid-19: Secondary | ICD-10-CM | POA: Insufficient documentation

## 2020-08-19 DIAGNOSIS — R0902 Hypoxemia: Secondary | ICD-10-CM | POA: Diagnosis not present

## 2020-08-19 DIAGNOSIS — Y999 Unspecified external cause status: Secondary | ICD-10-CM | POA: Insufficient documentation

## 2020-08-19 LAB — BASIC METABOLIC PANEL
Anion gap: 9 (ref 5–15)
BUN: 17 mg/dL (ref 8–23)
CO2: 29 mmol/L (ref 22–32)
Calcium: 9.1 mg/dL (ref 8.9–10.3)
Chloride: 100 mmol/L (ref 98–111)
Creatinine, Ser: 1.01 mg/dL — ABNORMAL HIGH (ref 0.44–1.00)
GFR calc Af Amer: 57 mL/min — ABNORMAL LOW (ref >60)
GFR calc non Af Amer: 49 mL/min — ABNORMAL LOW (ref >60)
Glucose, Bld: 85 mg/dL (ref 70–99)
Potassium: 4.1 mmol/L (ref 3.5–5.1)
Sodium: 138 mmol/L (ref 135–145)

## 2020-08-19 LAB — CBC
HCT: 42.9 % (ref 36.0–46.0)
Hemoglobin: 13.6 g/dL (ref 12.0–15.0)
MCH: 29.8 pg (ref 26.0–34.0)
MCHC: 31.7 g/dL (ref 30.0–36.0)
MCV: 93.9 fL (ref 80.0–100.0)
Platelets: 205 10*3/uL (ref 150–400)
RBC: 4.57 MIL/uL (ref 3.87–5.11)
RDW: 15.9 % — ABNORMAL HIGH (ref 11.5–15.5)
WBC: 9 10*3/uL (ref 4.0–10.5)
nRBC: 0 % (ref 0.0–0.2)

## 2020-08-19 LAB — SARS CORONAVIRUS 2 BY RT PCR (HOSPITAL ORDER, PERFORMED IN ~~LOC~~ HOSPITAL LAB): SARS Coronavirus 2: NEGATIVE

## 2020-08-19 SURGERY — EGD (ESOPHAGOGASTRODUODENOSCOPY)
Anesthesia: General

## 2020-08-19 MED ORDER — GLUCAGON HCL RDNA (DIAGNOSTIC) 1 MG IJ SOLR: 1.0000 mg | Freq: Once | INTRAMUSCULAR | Status: DC

## 2020-08-19 MED ORDER — SUCCINYLCHOLINE CHLORIDE 200 MG/10ML IV SOSY
PREFILLED_SYRINGE | INTRAVENOUS | Status: AC
  Filled 2020-08-19: qty 10

## 2020-08-19 MED ORDER — LIDOCAINE HCL (PF) 2 % IJ SOLN
INTRAMUSCULAR | Status: AC
  Filled 2020-08-19: qty 5

## 2020-08-19 MED ORDER — FENTANYL CITRATE (PF) 100 MCG/2ML IJ SOLN
INTRAMUSCULAR | Status: AC
  Filled 2020-08-19: qty 2

## 2020-08-19 MED ORDER — GLUCAGON HCL RDNA (DIAGNOSTIC) 1 MG IJ SOLR
1.0000 mg | Freq: Once | INTRAMUSCULAR | Status: AC
  Administered 2020-08-19: 1 mg via INTRAVENOUS
  Filled 2020-08-19: qty 1

## 2020-08-19 NOTE — ED Provider Notes (Signed)
[facility] Emergency Department Provider Note   ____________________________________________   I have reviewed the triage vital signs and the nursing notes.   HISTORY  Chief Complaint Food bolus  History limited by: Not Limited   HPI SHANEQUIA KENDRICK is a 86 y.o. female who presents to the emergency department today because of concern for food lodged in her throat. The patient states it occurred when she was eating dinner. She was eating meat when it appeared to get lodged in her throat. The patient has since had a hard time and is unable to swallow any liquids. Denies any shortness of breath. Per report staff at the living facility did perform the heimlich maneuver. The patient denies any chest pain. Denies any recent illness.    Records reviewed. Per medical record review patient has a history of copd, HLD.   Past Medical History:  Diagnosis Date  . A-fib ([place])   . Arthritis   . Cognitive dysfunction   . Colon adenoma   . COPD (chronic obstructive pulmonary disease) ([place])   . Depression   . Familial tremor   . HLD (hyperlipidemia)   . Hypertension   . Osteoarthritis   . Osteoporosis   . Ovarian cyst   . Psoriatic arthropathy ([place])   . Rotator cuff syndrome   . Sigmoid diverticulitis     Patient Active Problem List   Diagnosis Date Noted  . Psoriasis with arthropathy ([place]) 01/05/2016  . Benign essential HTN 09/08/2015  . Paroxysmal atrial fibrillation ([place]) 08/31/2015  . OP (osteoporosis) 04/20/2015    Past Surgical History:  Procedure Laterality Date  . ABDOMINAL HYSTERECTOMY    . COLONOSCOPY      Prior to Admission medications   Medication Sig Start Date End Date Taking? Authorizing Provider  albuterol (VENTOLIN HFA) 108 (90 Base) MCG/ACT inhaler Inhale 2 puffs into the lungs every 6 (six) hours as needed for up to 7 days for wheezing or shortness of breath. 08/10/19 08/17/19  Arta Silence, MD  alendronate (FOSAMAX) 70 MG  tablet Take 70 mg by mouth once a week. Reported on 01/22/2016    [provider]  aspirin 325 MG EC tablet Take 325 mg by mouth daily.    [provider]  calcium-vitamin D (SM CALCIUM 500/VITAMIN D3) 500-400 MG-UNIT tablet Take by mouth.    [provider]  cefpodoxime (VANTIN) 100 MG tablet Take 1 tablet (100 mg total) by mouth 2 (two) times daily. Patient not taking: Reported on 09/01/2019 04/24/18   Carrie Mew, MD  cephALEXin (KEFLEX) 500 MG capsule Take 1 capsule (500 mg total) by mouth 2 (two) times daily. 09/01/19   Harvest Dark, MD  citalopram (CELEXA) 10 MG tablet Take 10 mg by mouth daily.    [provider]  docusate sodium (COLACE) 100 MG capsule Take 100 mg by mouth daily.    [provider]  ENBREL SURECLICK 50 MG/ML injection Inject 0.8 mLs into the muscle once a week. 08/21/19   [provider]  estradiol (ESTRACE) 0.1 MG/GM vaginal cream INSERT 1 GRAM VAGINALLY 3 TIMES A WEEK. PEA SIZED AMOUNT PER URETHRA Patient not taking: Reported on 09/01/2019 02/14/17   Hollice Espy, MD  fluticasone [facility]) 50 MCG/ACT nasal spray Place 2 sprays into both nostrils daily.    [provider]  folic acid (FOLVITE) 1 MG tablet Take 1 mg by mouth daily.    [provider]  gabapentin (NEURONTIN) 300 MG capsule Take 300 mg by mouth daily.  08/17/19   [provider]  Melatonin 5 MG TABS Take 5 mg by mouth at bedtime as needed (sleep).    [provider]  Methotrexate, PF, 25 MG/0.4ML SOAJ Inject 0.8 mLs into the skin once a week. Monday 0800    [provider]  metoprolol tartrate (LOPRESSOR) 25 MG tablet Take 25 mg by mouth 2 (two) times daily.    [provider]  naproxen sodium (RA NAPROXEN SODIUM) 220 MG tablet Take by mouth. Reported on 01/22/2016    [provider]  Omega-3 Fatty Acids (FISH OIL PO) Take by mouth.    [provider]  ondansetron (ZOFRAN ODT) 4 MG  disintegrating tablet Take 1 tablet (4 mg total) by mouth every 8 (eight) hours as needed for nausea or vomiting. Patient not taking: Reported on 09/01/2019 04/24/18   Carrie Mew, MD  polyethylene glycol powder [facility]) 17 GM/SCOOP powder 1 cap full in a full glass of water, two times a day for 3 days. 05/14/20   Carrie Mew, MD  senna-docusate (SENOKOT-S) 8.6-50 MG tablet Take 2 tablets by mouth 2 (two) times daily. 05/14/20   Carrie Mew, MD  traMADol (ULTRAM) 50 MG tablet Take 50 mg by mouth every 6 (six) hours as needed.    [provider]  vitamin B-12 (CYANOCOBALAMIN) 1000 MCG tablet Take 1,000 mcg by mouth daily.    [provider]    Allergies Patient has no known allergies.  Family History  Problem Relation Age of Onset  . Diabetes Mother   . Ovarian cancer Mother   . Lung cancer Father   . Aneurysm Brother   . Diabetes Brother   . Breast cancer Sister   . Colon cancer Sister   . Diabetes Sister   . Kidney disease Other        nephew  . Bladder Cancer Neg Hx     Social History Social History   Tobacco Use  . Smoking status: Former Smoker    Quit date: 05/26/1995    Years since quitting: 25.2  . Smokeless tobacco: Never Used  Substance Use Topics  . Alcohol use: No  . Drug use: Never    Review of Systems Constitutional: No fever/chills Eyes: No visual changes. ENT: No sore throat. Cardiovascular: Denies chest pain. Respiratory: Denies shortness of breath. Gastrointestinal: Positive for food stuck in her throat.   Genitourinary: Negative for dysuria. Musculoskeletal: Negative for back pain. Skin: Negative for rash. Neurological: Negative for headaches, focal weakness or numbness.  ____________________________________________   PHYSICAL EXAM:  VITAL SIGNS: ED Triage Vitals  Enc Vitals Group     BP 08/19/20 1920 (!) 110/96     Pulse Rate 08/19/20 1915 69     Resp 08/19/20 1915 (!) 22     Temp 08/19/20 1915 98.8  F (37.1 C)     Temp Source 08/19/20 1915 Oral     SpO2 08/19/20 1915 94 %     Weight 08/19/20 1916 155 lb (70.3 kg)     Height 08/19/20 1916 5\' 6"  (1.676 m)     Head Circumference --      Peak Flow --      Pain Score 08/19/20 1916 0   Constitutional: Alert and oriented.  Eyes: Conjunctivae are normal.  ENT      Head: Normocephalic and atraumatic.      Nose: No congestion/rhinnorhea.      Mouth/Throat: Mucous membranes are moist.      Neck: No stridor. Hematological/Lymphatic/Immunilogical: No  cervical lymphadenopathy. Cardiovascular: Normal rate, regular rhythm.  No murmurs, rubs, or gallops.  Respiratory: Normal respiratory effort without tachypnea nor retractions. Breath sounds are clear and equal bilaterally. No wheezes/rales/rhonchi. Gastrointestinal: Soft and non tender. No rebound. No guarding. Frequent belching.  Genitourinary: Deferred Musculoskeletal: Normal range of motion in all extremities. No lower extremity edema. Neurologic:  Normal speech and language. No gross focal neurologic deficits are appreciated.  Skin:  Skin is warm, dry and intact. No rash noted. Psychiatric: Mood and affect are normal. Speech and behavior are normal. Patient exhibits appropriate insight and judgment.  ____________________________________________    LABS (pertinent positives/negatives)  COVID negative CBC wbc 9.0, hgb 13.6, plt 205 BMP na 138, k 4.1, glu 85, cr 1.01  ____________________________________________   EKG  None  ____________________________________________    RADIOLOGY  CXR Small pulmonary vascular congestion  ____________________________________________   PROCEDURES  Procedures  ____________________________________________   INITIAL IMPRESSION / ASSESSMENT AND PLAN / ED COURSE  Pertinent labs & imaging results that were available during my care of the patient were reviewed by me and considered in my medical decision making (see chart for details).    Patient presented to the emergency department today because of concerns for food impaction to the esophagus.  Patient was given glucagon without any relief.  Patient did have one episode of desaturation to the mid 80s here in the emergency department.  Chest x-ray without any concerning findings.  It does appear patient has history of COPD. Discussed with Dr. Alice Reichert with GI who will take patient to endoscopy suite.   ____________________________________________   FINAL CLINICAL IMPRESSION(S) / ED DIAGNOSES  Final diagnoses:  Food impaction of esophagus, initial encounter     Note: This dictation was prepared with Dragon dictation. Any transcriptional errors that result from this process are unintentional     Nance Pear, MD 08/19/20 [phone]

## 2020-08-19 NOTE — ED Notes (Signed)
Pt placed on bed alarm d/t trying to climb out of bed and fall risk

## 2020-08-19 NOTE — ED Notes (Signed)
Report to [facility] with Endo

## 2020-08-19 NOTE — ED Notes (Signed)
Pt continuing to belch

## 2020-08-19 NOTE — ED Notes (Signed)
Pt repeatedly taking off monitors, not able to be redirected

## 2020-08-19 NOTE — Anesthesia Preprocedure Evaluation (Deleted)
Anesthesia Evaluation  Patient identified by MRN, date of birth, ID band Patient awake    Reviewed: Allergy & Precautions, NPO status , Patient's Chart, lab work & pertinent test results  Airway Mallampati: III       Dental   Pulmonary COPD, former smoker,    Pulmonary exam normal        Cardiovascular hypertension, Normal cardiovascular exam+ dysrhythmias Atrial Fibrillation      Neuro/Psych PSYCHIATRIC DISORDERS Depression negative neurological ROS     GI/Hepatic Neg liver ROS,   Endo/Other  negative endocrine ROS  Renal/GU negative Renal ROS  negative genitourinary   Musculoskeletal  (+) Arthritis , Osteoarthritis,    Abdominal Normal abdominal exam  (+)   Peds negative pediatric ROS (+)  Hematology negative hematology ROS (+)   Anesthesia Other Findings   Reproductive/Obstetrics                             Anesthesia Physical Anesthesia Plan  ASA: III and emergent  Anesthesia Plan: General   Post-op Pain Management:    Induction: Intravenous, Rapid sequence and Cricoid pressure planned  PONV Risk Score and Plan:   Airway Management Planned: Oral ETT  Additional Equipment:   Intra-op Plan:   Post-operative Plan: Extubation in OR  Informed Consent: I have reviewed the patients History and Physical, chart, labs and discussed the procedure including the risks, benefits and alternatives for the proposed anesthesia with the patient or authorized representative who has indicated his/her understanding and acceptance.     Dental advisory given  Plan Discussed with: CRNA and Surgeon  Anesthesia Plan Comments:         Anesthesia Quick Evaluation

## 2020-08-19 NOTE — ED Notes (Signed)
Pt desat to 85%, placed on 3 L [place]. Dr Archie Balboa notified

## 2020-08-19 NOTE — ED Triage Notes (Addendum)
Pt to ED via [facility] from [facility] for chief complaint choking on ribs approx 4159 on ribs, heimlich performed by staff, reports pt is having difficulty swallowing water and has been coughing since.  Ems placed pt on 4L [place], pt 94% [place] Denies pain . States "i'm having trouble breathing" Pt belching frequently RR even and unlabored.  disoreiented to place, time and situation

## 2020-08-20 DIAGNOSIS — R1319 Other dysphagia: Secondary | ICD-10-CM | POA: Diagnosis not present

## 2020-08-20 DIAGNOSIS — T17920A Food in respiratory tract, part unspecified causing asphyxiation, initial encounter: Secondary | ICD-10-CM | POA: Diagnosis not present

## 2020-08-20 DIAGNOSIS — R0902 Hypoxemia: Secondary | ICD-10-CM | POA: Diagnosis not present

## 2020-08-20 DIAGNOSIS — T18128A Food in esophagus causing other injury, initial encounter: Secondary | ICD-10-CM | POA: Diagnosis not present

## 2020-08-20 NOTE — Discharge Instructions (Signed)
Although you had a food bolus impaction, it passed on its own without requiring a procedure.  I recommend you consider a soft food diet in the future.  Please follow up with your regular doctors at the next available opportunity.

## 2020-08-20 NOTE — Consult Note (Signed)
[facility] GI Inpatient Consult Note   Kathline Magic, M.D.  Reason for Consult: Dysphagia, acute esophageal food impaction   Attending Requesting Consult: Nance Pear, MD   History of Present Illness: Victoria Esparza is a 86 y.o. female sent to the emergency room by staff from the nursing home complaining of dysphagia since eating last night. Patient reportedly unable to swallow secretions. Says eating ribs last evening. Patient reportedly evaluated the emergency room and given glucagon with continued issues with dysphagia. Since no obvious resolution in symptoms, Dr. Archie Balboa called me to come to the hospital to evaluate the patient. When I arrived to the hospital I went in the patient's room and she appeared to be undisturbed and pleasantly confused. Denied any pain or difficulty swallowing. The nurse and I gave the patient a glass of water which she swallowed without difficulty. She denies any issues with chest discomfort or regurgitation and nursing observation revealed patient had no further issues with spitting up secretions or water. Patient denies any symptoms of abdominal pain or overt bleeding although she is still confused.   Past Medical History:  Past Medical History:  Diagnosis Date  . A-fib ([place])   . Arthritis   . Cognitive dysfunction   . Colon adenoma   . COPD (chronic obstructive pulmonary disease) ([place])   . Depression   . Familial tremor   . HLD (hyperlipidemia)   . Hypertension   . Osteoarthritis   . Osteoporosis   . Ovarian cyst   . Psoriatic arthropathy ([place])   . Rotator cuff syndrome   . Sigmoid diverticulitis     Problem List: Patient Active Problem List   Diagnosis Date Noted  . Psoriasis with arthropathy ([place]) 01/05/2016  . Benign essential HTN 09/08/2015  . Paroxysmal atrial fibrillation ([place]) 08/31/2015  . OP (osteoporosis) 04/20/2015    Past Surgical History: Past Surgical History:  Procedure Laterality Date  . ABDOMINAL  HYSTERECTOMY    . COLONOSCOPY      Allergies: No Known Allergies  Home Medications: (Not in a hospital admission)    Family History: family history includes Aneurysm in her brother; Breast cancer in her sister; Colon cancer in her sister; Diabetes in her brother, mother, and sister; Kidney disease in an other family member; Lung cancer in her father; Ovarian cancer in her mother.   GI Family History: Unknown  Social History:   reports that she quit smoking about 25 years ago. She has never used smokeless tobacco. She reports that she does not drink alcohol and does not use drugs. The patient denies ETOH, tobacco, or drug use.    Review of Systems: Review of Systems - Negative except HPI Largely unobtainable given poor memory of patient for recent and remote medical history.  Physical Examination: BP 138/81   Pulse 69   Temp 98.8 F (37.1 C) (Oral)   Resp 16   Ht 5\' 6"  (1.676 m)   Wt 70.3 kg   SpO2 91%   BMI 25.02 kg/m  Physical Exam Vitals reviewed. Exam conducted with a chaperone present.  Constitutional:      General: She is not in acute distress.    Appearance: She is not ill-appearing or diaphoretic.  HENT:     Head: Normocephalic and atraumatic.     Nose: Nose normal.  Cardiovascular:     Rate and Rhythm: Normal rate.     Pulses: Normal pulses.  Pulmonary:     Effort: Pulmonary effort is normal.  Breath sounds: Normal breath sounds.  Abdominal:     General: Abdomen is flat. There is no distension.     Palpations: Abdomen is soft. There is no mass.     Tenderness: There is no abdominal tenderness. There is no guarding or rebound.     Hernia: No hernia is present.  Skin:    General: Skin is warm.  Neurological:     Mental Status: She is alert. Mental status is at baseline. She is confused.     Sensory: Sensation is intact.     Motor: Motor function is intact.     Data: Lab Results  Component Value Date   WBC 9.0 08/19/2020   HGB 13.6 08/19/2020    HCT 42.9 08/19/2020   MCV 93.9 08/19/2020   PLT 205 08/19/2020   Recent Labs  Lab 08/19/20 1918  HGB 13.6   Lab Results  Component Value Date   NA 138 08/19/2020   K 4.1 08/19/2020   CL 100 08/19/2020   CO2 29 08/19/2020   BUN 17 08/19/2020   CREATININE 1.01 (H) 08/19/2020   Lab Results  Component Value Date   ALT 13 08/10/2019   AST 27 08/10/2019   ALKPHOS 49 08/10/2019   BILITOT 0.6 08/10/2019   No results for input(s): APTT, INR, PTT in the last 168 hours. CBC Latest Ref Rng & Units 08/19/2020 09/01/2019 08/10/2019  WBC 4.0 - 10.5 K/uL 9.0 7.0 7.1  Hemoglobin 12.0 - 15.0 g/dL 13.6 12.8 13.7  Hematocrit 36 - 46 % 42.9 41.0 42.6  Platelets 150 - 400 K/uL 205 168 218    STUDIES: DG Chest Portable 1 View  Result Date: 08/19/2020 CLINICAL DATA:  Hypoxia EXAM: PORTABLE CHEST 1 VIEW COMPARISON:  September 01, 2019 FINDINGS: The heart size and mediastinal contours are within normal limits. Mild prominence of the central pulmonary vasculature is seen. No large airspace consolidation or pleural effusion. No acute osseous abnormality. Aortic knob calcifications are seen. IMPRESSION: Small pulmonary vascular congestion. Electronically Signed   By: Prudencio Pair M.D.   On: 08/19/2020 21:32   @IMAGES @  Assessment: 1. Presumed esophageal food impaction with dysphagia, clinically resolved. Differential diagnosis includes incomplete mastication, esophageal stricture, esophageal malignancy, Schatzki ring, esophageal motility disorder.   2..  Advanced age with comorbidities.  COVID-19 status: Tested negative   Recommendations:  1.  Patient will be discharged back to the nursing home to advance diet from liquids to soft as tolerated. 2.  I called the patient's daughter, Stephan Minister, and notified her that we would not proceed with endoscopy as the foreign body appeared to have cleared spontaneously.  Patient's daughter acknowledged our notification and appeared fine with the plan to  discharge patient back to her skilled nursing facility. 3.  Follow-up with GI in the interim as needed.  Outpatient barium swallow study may be required for further evaluation.   Thank you for the consult. Please call with questions or concerns.  Olean Ree, "Lanny Hurst MD [facility] [place], [place] [address] [phone]  08/20/2020 12:04 AM

## 2020-08-20 NOTE — ED Provider Notes (Signed)
----------------------------------------- °  12:00 AM on 08/20/2020 -----------------------------------------  I spoke in person with Dr. Alice Reichert.  He evaluated the patient and she seems to have passed the food bolus.  He and the ED nurses observe the patient drink more than a cup of water without any difficulty.  The patient states that she has no pain and does not feel anything blocking her throat any longer.  The patient appears appropriate for discharge.  Dr. Alice Reichert is updating the patient's daughter and she will be transported back to [facility].  The daughter said that she was unavailable to come pick the patient up so she will apparently require EMS transport.   Hinda Kehr, MD 08/20/20 0002

## 2020-08-25 DIAGNOSIS — J449 Chronic obstructive pulmonary disease, unspecified: Secondary | ICD-10-CM | POA: Diagnosis not present

## 2020-08-25 DIAGNOSIS — M069 Rheumatoid arthritis, unspecified: Secondary | ICD-10-CM | POA: Diagnosis not present

## 2020-08-25 DIAGNOSIS — T18128D Food in esophagus causing other injury, subsequent encounter: Secondary | ICD-10-CM | POA: Diagnosis not present

## 2020-08-25 DIAGNOSIS — F0390 Unspecified dementia without behavioral disturbance: Secondary | ICD-10-CM | POA: Diagnosis not present

## 2020-08-25 DIAGNOSIS — I10 Essential (primary) hypertension: Secondary | ICD-10-CM | POA: Diagnosis not present

## 2020-08-25 DIAGNOSIS — Z7982 Long term (current) use of aspirin: Secondary | ICD-10-CM | POA: Diagnosis not present

## 2020-09-11 DIAGNOSIS — L84 Corns and callosities: Secondary | ICD-10-CM | POA: Diagnosis not present

## 2020-09-11 DIAGNOSIS — G629 Polyneuropathy, unspecified: Secondary | ICD-10-CM | POA: Diagnosis not present

## 2020-09-11 DIAGNOSIS — B351 Tinea unguium: Secondary | ICD-10-CM | POA: Diagnosis not present

## 2020-09-11 DIAGNOSIS — I739 Peripheral vascular disease, unspecified: Secondary | ICD-10-CM | POA: Diagnosis not present

## 2020-09-17 DIAGNOSIS — Z7982 Long term (current) use of aspirin: Secondary | ICD-10-CM | POA: Diagnosis not present

## 2020-09-17 DIAGNOSIS — F0390 Unspecified dementia without behavioral disturbance: Secondary | ICD-10-CM | POA: Diagnosis not present

## 2020-09-17 DIAGNOSIS — I10 Essential (primary) hypertension: Secondary | ICD-10-CM | POA: Diagnosis not present

## 2020-09-17 DIAGNOSIS — F5101 Primary insomnia: Secondary | ICD-10-CM | POA: Diagnosis not present

## 2020-09-17 DIAGNOSIS — I4891 Unspecified atrial fibrillation: Secondary | ICD-10-CM | POA: Diagnosis not present

## 2020-09-17 DIAGNOSIS — R1310 Dysphagia, unspecified: Secondary | ICD-10-CM | POA: Diagnosis not present

## 2020-09-17 DIAGNOSIS — M069 Rheumatoid arthritis, unspecified: Secondary | ICD-10-CM | POA: Diagnosis not present

## 2020-09-17 DIAGNOSIS — G629 Polyneuropathy, unspecified: Secondary | ICD-10-CM | POA: Diagnosis not present

## 2020-10-05 ENCOUNTER — Emergency Department: Payer: Medicare HMO

## 2020-10-05 ENCOUNTER — Emergency Department
Admission: EM | Admit: 2020-10-05 | Discharge: 2020-10-05 | Disposition: A | Discharge status: Home / Self Care | Payer: Medicare HMO | Attending: Emergency Medicine | Admitting: Emergency Medicine

## 2020-10-05 ENCOUNTER — Other Ambulatory Visit: Payer: Self-pay

## 2020-10-05 ENCOUNTER — Encounter: Payer: Self-pay | Admitting: Emergency Medicine

## 2020-10-05 DIAGNOSIS — S0003XA Contusion of scalp, initial encounter: Secondary | ICD-10-CM | POA: Diagnosis not present

## 2020-10-05 DIAGNOSIS — W19XXXA Unspecified fall, initial encounter: Secondary | ICD-10-CM | POA: Diagnosis not present

## 2020-10-05 DIAGNOSIS — R404 Transient alteration of awareness: Secondary | ICD-10-CM | POA: Diagnosis not present

## 2020-10-05 DIAGNOSIS — E042 Nontoxic multinodular goiter: Secondary | ICD-10-CM | POA: Diagnosis not present

## 2020-10-05 DIAGNOSIS — G309 Alzheimer's disease, unspecified: Secondary | ICD-10-CM | POA: Diagnosis not present

## 2020-10-05 DIAGNOSIS — S199XXA Unspecified injury of neck, initial encounter: Secondary | ICD-10-CM | POA: Diagnosis not present

## 2020-10-05 DIAGNOSIS — Z79899 Other long term (current) drug therapy: Secondary | ICD-10-CM | POA: Insufficient documentation

## 2020-10-05 DIAGNOSIS — F0280 Dementia in other diseases classified elsewhere without behavioral disturbance: Secondary | ICD-10-CM | POA: Insufficient documentation

## 2020-10-05 DIAGNOSIS — Z7982 Long term (current) use of aspirin: Secondary | ICD-10-CM | POA: Diagnosis not present

## 2020-10-05 DIAGNOSIS — I480 Paroxysmal atrial fibrillation: Secondary | ICD-10-CM

## 2020-10-05 DIAGNOSIS — I6782 Cerebral ischemia: Secondary | ICD-10-CM | POA: Diagnosis not present

## 2020-10-05 DIAGNOSIS — M4319 Spondylolisthesis, multiple sites in spine: Secondary | ICD-10-CM | POA: Diagnosis not present

## 2020-10-05 DIAGNOSIS — I10 Essential (primary) hypertension: Secondary | ICD-10-CM | POA: Insufficient documentation

## 2020-10-05 DIAGNOSIS — Z87891 Personal history of nicotine dependence: Secondary | ICD-10-CM | POA: Insufficient documentation

## 2020-10-05 DIAGNOSIS — G319 Degenerative disease of nervous system, unspecified: Secondary | ICD-10-CM | POA: Diagnosis not present

## 2020-10-05 DIAGNOSIS — R0902 Hypoxemia: Secondary | ICD-10-CM | POA: Diagnosis not present

## 2020-10-05 DIAGNOSIS — J449 Chronic obstructive pulmonary disease, unspecified: Secondary | ICD-10-CM | POA: Diagnosis not present

## 2020-10-05 DIAGNOSIS — I6529 Occlusion and stenosis of unspecified carotid artery: Secondary | ICD-10-CM | POA: Diagnosis not present

## 2020-10-05 DIAGNOSIS — M47812 Spondylosis without myelopathy or radiculopathy, cervical region: Secondary | ICD-10-CM | POA: Diagnosis not present

## 2020-10-05 LAB — URINALYSIS, COMPLETE (UACMP) WITH MICROSCOPIC
Bacteria, UA: NONE SEEN
Bilirubin Urine: NEGATIVE
Glucose, UA: NEGATIVE mg/dL
Hgb urine dipstick: NEGATIVE
Ketones, ur: NEGATIVE mg/dL
Leukocytes,Ua: NEGATIVE
Nitrite: NEGATIVE
Protein, ur: NEGATIVE mg/dL
Specific Gravity, Urine: 1.015 (ref 1.005–1.030)
Squamous Epithelial / HPF: NONE SEEN (ref 0–5)
pH: 6 (ref 5.0–8.0)

## 2020-10-05 LAB — CBC WITH DIFFERENTIAL/PLATELET
Abs Immature Granulocytes: 0.02 10*3/uL (ref 0.00–0.07)
Basophils Absolute: 0.1 10*3/uL (ref 0.0–0.1)
Basophils Relative: 1 %
Eosinophils Absolute: 0.2 10*3/uL (ref 0.0–0.5)
Eosinophils Relative: 2 %
HCT: 42 % (ref 36.0–46.0)
Hemoglobin: 13.4 g/dL (ref 12.0–15.0)
Immature Granulocytes: 0 %
Lymphocytes Relative: 32 %
Lymphs Abs: 2.9 10*3/uL (ref 0.7–4.0)
MCH: 30 pg (ref 26.0–34.0)
MCHC: 31.9 g/dL (ref 30.0–36.0)
MCV: 94.2 fL (ref 80.0–100.0)
Monocytes Absolute: 0.8 10*3/uL (ref 0.1–1.0)
Monocytes Relative: 9 %
Neutro Abs: 5 10*3/uL (ref 1.7–7.7)
Neutrophils Relative %: 56 %
Platelets: 156 10*3/uL (ref 150–400)
RBC: 4.46 MIL/uL (ref 3.87–5.11)
RDW: 15.4 % (ref 11.5–15.5)
WBC: 9 10*3/uL (ref 4.0–10.5)
nRBC: 0 % (ref 0.0–0.2)

## 2020-10-05 LAB — BASIC METABOLIC PANEL
Anion gap: 9 (ref 5–15)
BUN: 19 mg/dL (ref 8–23)
CO2: 29 mmol/L (ref 22–32)
Calcium: 9 mg/dL (ref 8.9–10.3)
Chloride: 100 mmol/L (ref 98–111)
Creatinine, Ser: 0.84 mg/dL (ref 0.44–1.00)
GFR, Estimated: >60 mL/min (ref >60)
Glucose, Bld: 89 mg/dL (ref 70–99)
Potassium: 3.9 mmol/L (ref 3.5–5.1)
Sodium: 138 mmol/L (ref 135–145)

## 2020-10-05 NOTE — ED Notes (Signed)
Pt unable to sign d/c r/t hx of dementia

## 2020-10-05 NOTE — ED Notes (Signed)
Spoke with Barbaraann Rondo at [facility] and she stated she will see if the [facility] can come and pick patient up

## 2020-10-05 NOTE — ED Provider Notes (Signed)
[facility] Emergency Department Provider Note   ____________________________________________   First MD Initiated Contact with Patient 10/05/20 1351     (approximate)  I have reviewed the triage vital signs and the nursing notes.   HISTORY  Chief Complaint Fall    HPI Victoria Esparza is a 86 y.o. female with possible history of Alzheimer's dementia, hypertension, hyperlipidemia, atrial fibrillation, COPD, rheumatoid arthritis, and essential tremor who presents to the ED following fall.  History is limited due to patient's dementia.  Patient arrives from [facility] facility, where she had an unwitnessed fall, was found down the ground by staff earlier this morning.  She is reportedly at her baseline mental status, denies any areas of pain but noted to have a small hematoma to the back of her head.  She is not currently anticoagulated due to fall risk.  She denies any complaints at this time, states she feels well.        Past Medical History:  Diagnosis Date  . A-fib ([place])   . Arthritis   . Cognitive dysfunction   . Colon adenoma   . COPD (chronic obstructive pulmonary disease) ([place])   . Depression   . Familial tremor   . HLD (hyperlipidemia)   . Hypertension   . Osteoarthritis   . Osteoporosis   . Ovarian cyst   . Psoriatic arthropathy ([place])   . Rotator cuff syndrome   . Sigmoid diverticulitis     Patient Active Problem List   Diagnosis Date Noted  . Psoriasis with arthropathy ([place]) 01/05/2016  . Benign essential HTN 09/08/2015  . Paroxysmal atrial fibrillation ([place]) 08/31/2015  . OP (osteoporosis) 04/20/2015    Past Surgical History:  Procedure Laterality Date  . ABDOMINAL HYSTERECTOMY    . COLONOSCOPY      Prior to Admission medications   Medication Sig Start Date End Date Taking? Authorizing Provider  albuterol (VENTOLIN HFA) 108 (90 Base) MCG/ACT inhaler Inhale 2 puffs into the lungs every 6 (six) hours as needed  for up to 7 days for wheezing or shortness of breath. 08/10/19 08/17/19  Arta Silence, MD  alendronate (FOSAMAX) 70 MG tablet Take 70 mg by mouth once a week. Reported on 01/22/2016    [provider]  aspirin 325 MG EC tablet Take 325 mg by mouth daily.    [provider]  calcium-vitamin D (SM CALCIUM 500/VITAMIN D3) 500-400 MG-UNIT tablet Take by mouth.    [provider]  cefpodoxime (VANTIN) 100 MG tablet Take 1 tablet (100 mg total) by mouth 2 (two) times daily. Patient not taking: Reported on 09/01/2019 04/24/18   Carrie Mew, MD  cephALEXin (KEFLEX) 500 MG capsule Take 1 capsule (500 mg total) by mouth 2 (two) times daily. Patient not taking: Reported on 08/19/2020 09/01/19   Harvest Dark, MD  citalopram (CELEXA) 10 MG tablet Take 10 mg by mouth daily.    [provider]  docusate sodium (COLACE) 100 MG capsule Take 100 mg by mouth daily. Patient not taking: Reported on 08/19/2020    [provider]  ENBREL SURECLICK 50 MG/ML injection Inject 0.8 mLs into the muscle once a week. 08/21/19   [provider]  estradiol (ESTRACE) 0.1 MG/GM vaginal cream INSERT 1 GRAM VAGINALLY 3 TIMES A WEEK. PEA SIZED AMOUNT PER URETHRA Patient not taking: Reported on 09/01/2019 02/14/17   Hollice Espy, MD  fluticasone [facility]) 50 MCG/ACT nasal spray Place 2 sprays into both nostrils daily. Patient not taking: Reported  on 08/19/2020    [provider]  folic acid (FOLVITE) 1 MG tablet Take 1 mg by mouth daily. Patient not taking: Reported on 08/19/2020    [provider]  gabapentin (NEURONTIN) 300 MG capsule Take 300 mg by mouth daily. 08/17/19   [provider]  Melatonin 5 MG TABS Take 5 mg by mouth at bedtime as needed (sleep).    [provider]  Methotrexate, PF, 25 MG/0.4ML SOAJ Inject 0.8 mLs into the skin once a week. Monday 0800    [provider]  metoprolol tartrate (LOPRESSOR) 25 MG tablet Take  25 mg by mouth 2 (two) times daily.    [provider]  mirtazapine (REMERON) 15 MG tablet Take 15 mg by mouth at bedtime.    [provider]  naproxen sodium (RA NAPROXEN SODIUM) 220 MG tablet Take by mouth. Reported on 01/22/2016    [provider]  Omega-3 Fatty Acids (FISH OIL PO) Take by mouth.    [provider]  ondansetron (ZOFRAN ODT) 4 MG disintegrating tablet Take 1 tablet (4 mg total) by mouth every 8 (eight) hours as needed for nausea or vomiting. Patient not taking: Reported on 09/01/2019 04/24/18   Carrie Mew, MD  polyethylene glycol powder [facility]) 17 GM/SCOOP powder 1 cap full in a full glass of water, two times a day for 3 days. Patient not taking: Reported on 08/19/2020 05/14/20   Carrie Mew, MD  senna-docusate (SENOKOT-S) 8.6-50 MG tablet Take 2 tablets by mouth 2 (two) times daily. 05/14/20   Carrie Mew, MD  sodium chloride (OCEAN) 0.65 % SOLN nasal spray Place 1 spray into both nostrils as needed for congestion.    [provider]  traMADol (ULTRAM) 50 MG tablet Take 50 mg by mouth every 6 (six) hours as needed. Patient not taking: Reported on 08/19/2020    [provider]  vitamin B-12 (CYANOCOBALAMIN) 1000 MCG tablet Take 1,000 mcg by mouth daily.    [provider]    Allergies Patient has no known allergies.  Family History  Problem Relation Age of Onset  . Diabetes Mother   . Ovarian cancer Mother   . Lung cancer Father   . Aneurysm Brother   . Diabetes Brother   . Breast cancer Sister   . Colon cancer Sister   . Diabetes Sister   . Kidney disease Other        nephew  . Bladder Cancer Neg Hx     Social History Social History   Tobacco Use  . Smoking status: Former Smoker    Quit date: 05/26/1995    Years since quitting: 25.3  . Smokeless tobacco: Never Used  Substance Use Topics  . Alcohol use: No  . Drug use: Never    Review of Systems  Constitutional: No  fever/chills.  Positive for fall. Eyes: No visual changes. ENT: No sore throat. Cardiovascular: Denies chest pain. Respiratory: Denies shortness of breath. Gastrointestinal: No abdominal pain.  No nausea, no vomiting.  No diarrhea.  No constipation. Genitourinary: Negative for dysuria. Musculoskeletal: Negative for back pain. Skin: Negative for rash. Neurological: Negative for headaches, focal weakness or numbness.  ____________________________________________   PHYSICAL EXAM:  VITAL SIGNS: ED Triage Vitals  Enc Vitals Group     BP 10/05/20 1013 (!) 165/83     Pulse Rate 10/05/20 1013 92     Resp 10/05/20 1013 18     Temp 10/05/20 1013 98.2 F (36.8 C)     Temp  Source 10/05/20 1013 Oral     SpO2 10/05/20 1013 96 %     Weight 10/05/20 1015 154 lb 15.7 oz (70.3 kg)     Height 10/05/20 1015 5\' 6"  (6.237 m)     Head Circumference --      Peak Flow --      Pain Score --      Pain Loc --      Pain Edu? --      Excl. in [place]? --     Constitutional: Alert and oriented to person, but not place or time. Eyes: Conjunctivae are normal. Head: Small hematoma to posterior scalp. Nose: No congestion/rhinnorhea. Mouth/Throat: Mucous membranes are moist. Neck: Normal ROM, no midline cervical spine tenderness. Cardiovascular: Normal rate, irregularly irregular rhythm. Grossly normal heart sounds. Respiratory: Normal respiratory effort.  No retractions. Lungs CTAB. Gastrointestinal: Soft and nontender. No distention. Genitourinary: deferred Musculoskeletal: No lower extremity tenderness nor edema.  No upper extremity bony tenderness.  Pelvis stable with no tenderness. Neurologic:  Normal speech and language. No gross focal neurologic deficits are appreciated. Skin:  Skin is warm, dry and intact. No rash noted. Psychiatric: Mood and affect are normal. Speech and behavior are normal.  ____________________________________________   LABS (all labs ordered are listed, but only abnormal  results are displayed)  Labs Reviewed  URINALYSIS, COMPLETE (UACMP) WITH MICROSCOPIC - Abnormal; Notable for the following components:      Result Value   Color, Urine YELLOW (*)    APPearance CLEAR (*)    All other components within normal limits  BASIC METABOLIC PANEL  CBC WITH DIFFERENTIAL/PLATELET   ____________________________________________  EKG  ED ECG REPORT I, Blake Divine, the attending physician, personally viewed and interpreted this ECG.   Date: 10/05/2020  EKG Time: 13:36  Rate: 78  Rhythm: atrial fibrillation, rate 78  Axis: Normal  Intervals:none  ST&T Change: None   PROCEDURES  Procedure(s) performed (including Critical Care):  Procedures   ____________________________________________   INITIAL IMPRESSION / ASSESSMENT AND PLAN / ED COURSE       86 year old female with possible history of Alzheimer's dementia, paroxysmal A. fib not currently anticoagulated, hypertension, hyperlipidemia, COPD, rheumatoid arthritis, and essential tremor who presents to the ED following unwitnessed fall at her nursing facility.  Patient is awake and alert, at her reported neurologic baseline with no focal deficits.  CT head and C-spine are negative for acute process.  She has a small hematoma to her posterior scalp but no laceration.  EKG shows atrial fibrillation but no other acute changes, patient does not remember falling and we will screen labs as well as UA.  Lab work unremarkable, renal function electrolytes within normal limits.  UA shows no evidence of infection.  Patient remains at her baseline mental status and there is no evidence of traumatic injury at this time.  She is appropriate for discharge home to nursing facility with PCP follow-up.  I attempted to reach patient's daughter by phone, however she was unavailable.      ____________________________________________   FINAL CLINICAL IMPRESSION(S) / ED DIAGNOSES  Final diagnoses:  Fall, initial  encounter  Paroxysmal atrial fibrillation ([place])  Alzheimer's dementia without behavioral disturbance, unspecified timing of dementia onset ([place])  Hematoma of scalp, initial encounter     ED Discharge Orders    None       Note:  This document was prepared using Dragon voice recognition software and may include unintentional dictation errors.   Blake Divine, MD 10/05/20 1452

## 2020-10-05 NOTE — ED Triage Notes (Signed)
Pt in via EMS from [facility]. EMS reports pt with unwitnessed fall, pt was found in the BR. EMS reports pt with hematoma on back right side of head, at baseline metal status, denies pain. Pt with alzheimers, daughter present, 95% RA, 80 HR, 157/78, 131 FSBS

## 2020-10-05 NOTE — ED Triage Notes (Signed)
incontinent urine in lobby.  Patient changed and skin care given.  Strong smelling urine noted.

## 2020-10-06 DIAGNOSIS — Z7982 Long term (current) use of aspirin: Secondary | ICD-10-CM | POA: Diagnosis not present

## 2020-10-06 DIAGNOSIS — Z09 Encounter for follow-up examination after completed treatment for conditions other than malignant neoplasm: Secondary | ICD-10-CM | POA: Diagnosis not present

## 2020-10-06 DIAGNOSIS — Z9181 History of falling: Secondary | ICD-10-CM | POA: Diagnosis not present

## 2020-10-06 DIAGNOSIS — R269 Unspecified abnormalities of gait and mobility: Secondary | ICD-10-CM | POA: Diagnosis not present

## 2020-10-06 DIAGNOSIS — J449 Chronic obstructive pulmonary disease, unspecified: Secondary | ICD-10-CM | POA: Diagnosis not present

## 2020-10-06 DIAGNOSIS — I4891 Unspecified atrial fibrillation: Secondary | ICD-10-CM | POA: Diagnosis not present

## 2020-10-20 DIAGNOSIS — R5381 Other malaise: Secondary | ICD-10-CM | POA: Diagnosis not present

## 2020-10-20 DIAGNOSIS — I4891 Unspecified atrial fibrillation: Secondary | ICD-10-CM | POA: Diagnosis not present

## 2020-10-20 DIAGNOSIS — G629 Polyneuropathy, unspecified: Secondary | ICD-10-CM | POA: Diagnosis not present

## 2020-10-20 DIAGNOSIS — F0280 Dementia in other diseases classified elsewhere without behavioral disturbance: Secondary | ICD-10-CM | POA: Diagnosis not present

## 2020-10-20 DIAGNOSIS — G309 Alzheimer's disease, unspecified: Secondary | ICD-10-CM | POA: Diagnosis not present

## 2020-10-20 DIAGNOSIS — M069 Rheumatoid arthritis, unspecified: Secondary | ICD-10-CM | POA: Diagnosis not present

## 2020-10-20 DIAGNOSIS — R296 Repeated falls: Secondary | ICD-10-CM | POA: Diagnosis not present

## 2020-10-29 DIAGNOSIS — R269 Unspecified abnormalities of gait and mobility: Secondary | ICD-10-CM | POA: Diagnosis not present

## 2020-10-29 DIAGNOSIS — M069 Rheumatoid arthritis, unspecified: Secondary | ICD-10-CM | POA: Diagnosis not present

## 2020-10-29 DIAGNOSIS — R296 Repeated falls: Secondary | ICD-10-CM | POA: Diagnosis not present

## 2020-10-29 DIAGNOSIS — G309 Alzheimer's disease, unspecified: Secondary | ICD-10-CM | POA: Diagnosis not present

## 2020-11-17 DIAGNOSIS — M069 Rheumatoid arthritis, unspecified: Secondary | ICD-10-CM | POA: Diagnosis not present

## 2020-11-17 DIAGNOSIS — Z7982 Long term (current) use of aspirin: Secondary | ICD-10-CM | POA: Diagnosis not present

## 2020-11-17 DIAGNOSIS — G8929 Other chronic pain: Secondary | ICD-10-CM | POA: Diagnosis not present

## 2020-11-17 DIAGNOSIS — J449 Chronic obstructive pulmonary disease, unspecified: Secondary | ICD-10-CM | POA: Diagnosis not present

## 2020-11-17 DIAGNOSIS — G250 Essential tremor: Secondary | ICD-10-CM | POA: Diagnosis not present

## 2020-11-17 DIAGNOSIS — G894 Chronic pain syndrome: Secondary | ICD-10-CM | POA: Diagnosis not present

## 2020-11-17 DIAGNOSIS — F0390 Unspecified dementia without behavioral disturbance: Secondary | ICD-10-CM | POA: Diagnosis not present

## 2020-11-17 DIAGNOSIS — I4891 Unspecified atrial fibrillation: Secondary | ICD-10-CM | POA: Diagnosis not present

## 2020-11-25 ENCOUNTER — Emergency Department
Admission: EM | Admit: 2020-11-25 | Discharge: 2020-11-25 | Disposition: A | Discharge status: Home / Self Care | Payer: Medicare HMO | Attending: Emergency Medicine | Admitting: Emergency Medicine

## 2020-11-25 ENCOUNTER — Emergency Department: Payer: Medicare HMO

## 2020-11-25 ENCOUNTER — Other Ambulatory Visit: Payer: Self-pay

## 2020-11-25 ENCOUNTER — Encounter: Payer: Self-pay | Admitting: Emergency Medicine

## 2020-11-25 DIAGNOSIS — F0390 Unspecified dementia without behavioral disturbance: Secondary | ICD-10-CM | POA: Insufficient documentation

## 2020-11-25 DIAGNOSIS — J449 Chronic obstructive pulmonary disease, unspecified: Secondary | ICD-10-CM | POA: Insufficient documentation

## 2020-11-25 DIAGNOSIS — Z79899 Other long term (current) drug therapy: Secondary | ICD-10-CM | POA: Diagnosis not present

## 2020-11-25 DIAGNOSIS — Z7982 Long term (current) use of aspirin: Secondary | ICD-10-CM | POA: Diagnosis not present

## 2020-11-25 DIAGNOSIS — K1379 Other lesions of oral mucosa: Secondary | ICD-10-CM

## 2020-11-25 DIAGNOSIS — Z87891 Personal history of nicotine dependence: Secondary | ICD-10-CM | POA: Insufficient documentation

## 2020-11-25 DIAGNOSIS — R041 Hemorrhage from throat: Secondary | ICD-10-CM | POA: Diagnosis not present

## 2020-11-25 DIAGNOSIS — I10 Essential (primary) hypertension: Secondary | ICD-10-CM | POA: Diagnosis not present

## 2020-11-25 LAB — TYPE AND SCREEN
ABO/RH(D): O POS
Antibody Screen: NEGATIVE

## 2020-11-25 LAB — CBC WITH DIFFERENTIAL/PLATELET
Abs Immature Granulocytes: 0.03 10*3/uL (ref 0.00–0.07)
Basophils Absolute: 0.1 10*3/uL (ref 0.0–0.1)
Basophils Relative: 1 %
Eosinophils Absolute: 0.2 10*3/uL (ref 0.0–0.5)
Eosinophils Relative: 2 %
HCT: 45.9 % (ref 36.0–46.0)
Hemoglobin: 14.3 g/dL (ref 12.0–15.0)
Immature Granulocytes: 0 %
Lymphocytes Relative: 23 %
Lymphs Abs: 2.8 10*3/uL (ref 0.7–4.0)
MCH: 29.7 pg (ref 26.0–34.0)
MCHC: 31.2 g/dL (ref 30.0–36.0)
MCV: 95.2 fL (ref 80.0–100.0)
Monocytes Absolute: 0.9 10*3/uL (ref 0.1–1.0)
Monocytes Relative: 7 %
Neutro Abs: 7.8 10*3/uL — ABNORMAL HIGH (ref 1.7–7.7)
Neutrophils Relative %: 67 %
Platelets: 152 10*3/uL (ref 150–400)
RBC: 4.82 MIL/uL (ref 3.87–5.11)
RDW: 14.6 % (ref 11.5–15.5)
WBC: 11.8 10*3/uL — ABNORMAL HIGH (ref 4.0–10.5)
nRBC: 0 % (ref 0.0–0.2)

## 2020-11-25 LAB — PROTIME-INR
INR: 1.1 (ref 0.8–1.2)
Prothrombin Time: 13.5 seconds (ref 11.4–15.2)

## 2020-11-25 NOTE — ED Notes (Signed)
Pt returned from CT °

## 2020-11-25 NOTE — ED Notes (Signed)
Pt assisted to the bathroom as a 1-person assist.  Pt tolerated this well at this time.

## 2020-11-25 NOTE — ED Notes (Signed)
ED Provider at bedside. 

## 2020-11-25 NOTE — ED Triage Notes (Signed)
Pt to ED via [facility] from [facility] for oral bleeding. Per EMS staff reports finding pt this morning in bed with blood around her mouth. Pt is not on blood thinner. Pt denies fall but does have hx/o dementia. Pt SpO2 86% on arrival, pt placed on 3 liters via [place].

## 2020-11-25 NOTE — ED Provider Notes (Signed)
[facility] Emergency Department Provider Note   ____________________________________________   First MD Initiated Contact with Patient 11/25/20 [phone]     (approximate)  I have reviewed the triage vital signs and the nursing notes.   HISTORY  Chief Complaint Oral Bleeding    HPI SAMARA STANKOWSKI is a 86 y.o. female with a past medical history of dementia, A. fib, COPD, and hypertension who presents from her long-term care facility with EMS after she was found to have some oral bleeding.  Per EMS, staff reports finding patient in bed this morning with blood around her mouth.  Patient is not on a blood thinner.  EMS state the patient was also found with oxygen saturation of 86 on their arrival and placed on 3 L via nasal cannula with solid improvement.  Further history and review of systems unable to assess at this time given patient's mental status         Past Medical History:  Diagnosis Date  . A-fib ([place])   . Arthritis   . Cognitive dysfunction   . Colon adenoma   . COPD (chronic obstructive pulmonary disease) ([place])   . Depression   . Familial tremor   . HLD (hyperlipidemia)   . Hypertension   . Osteoarthritis   . Osteoporosis   . Ovarian cyst   . Psoriatic arthropathy ([place])   . Rotator cuff syndrome   . Sigmoid diverticulitis     Patient Active Problem List   Diagnosis Date Noted  . Psoriasis with arthropathy ([place]) 01/05/2016  . Benign essential HTN 09/08/2015  . Paroxysmal atrial fibrillation ([place]) 08/31/2015  . OP (osteoporosis) 04/20/2015    Past Surgical History:  Procedure Laterality Date  . ABDOMINAL HYSTERECTOMY    . COLONOSCOPY      Prior to Admission medications   Medication Sig Start Date End Date Taking? Authorizing Provider  albuterol (VENTOLIN HFA) 108 (90 Base) MCG/ACT inhaler Inhale 2 puffs into the lungs every 6 (six) hours as needed for up to 7 days for wheezing or shortness of breath. 08/10/19 08/17/19  Arta Silence, MD  alendronate (FOSAMAX) 70 MG tablet Take 70 mg by mouth once a week. Reported on 01/22/2016    [provider]  aspirin 325 MG EC tablet Take 325 mg by mouth daily.    [provider]  calcium-vitamin D (SM CALCIUM 500/VITAMIN D3) 500-400 MG-UNIT tablet Take by mouth.    [provider]  cefpodoxime (VANTIN) 100 MG tablet Take 1 tablet (100 mg total) by mouth 2 (two) times daily. Patient not taking: Reported on 09/01/2019 04/24/18   Carrie Mew, MD  cephALEXin (KEFLEX) 500 MG capsule Take 1 capsule (500 mg total) by mouth 2 (two) times daily. Patient not taking: Reported on 08/19/2020 09/01/19   Harvest Dark, MD  citalopram (CELEXA) 10 MG tablet Take 10 mg by mouth daily.    [provider]  docusate sodium (COLACE) 100 MG capsule Take 100 mg by mouth daily. Patient not taking: Reported on 08/19/2020    [provider]  ENBREL SURECLICK 50 MG/ML injection Inject 0.8 mLs into the muscle once a week. 08/21/19   [provider]  estradiol (ESTRACE) 0.1 MG/GM vaginal cream INSERT 1 GRAM VAGINALLY 3 TIMES A WEEK. PEA SIZED AMOUNT PER URETHRA Patient not taking: Reported on 09/01/2019 02/14/17   Hollice Espy, MD  fluticasone [facility]) 50 MCG/ACT nasal spray Place 2 sprays into both nostrils daily. Patient not taking: Reported on 08/19/2020  [provider]  folic acid (FOLVITE) 1 MG tablet Take 1 mg by mouth daily. Patient not taking: Reported on 08/19/2020    [provider]  gabapentin (NEURONTIN) 300 MG capsule Take 300 mg by mouth daily. 08/17/19   [provider]  Melatonin 5 MG TABS Take 5 mg by mouth at bedtime as needed (sleep).    [provider]  Methotrexate, PF, 25 MG/0.4ML SOAJ Inject 0.8 mLs into the skin once a week. Monday 0800    [provider]  metoprolol tartrate (LOPRESSOR) 25 MG tablet Take 25 mg by mouth 2 (two) times daily.    [provider]  mirtazapine  (REMERON) 15 MG tablet Take 15 mg by mouth at bedtime.    [provider]  naproxen sodium (RA NAPROXEN SODIUM) 220 MG tablet Take by mouth. Reported on 01/22/2016    [provider]  Omega-3 Fatty Acids (FISH OIL PO) Take by mouth.    [provider]  ondansetron (ZOFRAN ODT) 4 MG disintegrating tablet Take 1 tablet (4 mg total) by mouth every 8 (eight) hours as needed for nausea or vomiting. Patient not taking: Reported on 09/01/2019 04/24/18   Carrie Mew, MD  polyethylene glycol powder [facility]) 17 GM/SCOOP powder 1 cap full in a full glass of water, two times a day for 3 days. Patient not taking: Reported on 08/19/2020 05/14/20   Carrie Mew, MD  senna-docusate (SENOKOT-S) 8.6-50 MG tablet Take 2 tablets by mouth 2 (two) times daily. 05/14/20   Carrie Mew, MD  sodium chloride (OCEAN) 0.65 % SOLN nasal spray Place 1 spray into both nostrils as needed for congestion.    [provider]  traMADol (ULTRAM) 50 MG tablet Take 50 mg by mouth every 6 (six) hours as needed. Patient not taking: Reported on 08/19/2020    [provider]  vitamin B-12 (CYANOCOBALAMIN) 1000 MCG tablet Take 1,000 mcg by mouth daily.    [provider]    Allergies Patient has no known allergies.  Family History  Problem Relation Age of Onset  . Diabetes Mother   . Ovarian cancer Mother   . Lung cancer Father   . Aneurysm Brother   . Diabetes Brother   . Breast cancer Sister   . Colon cancer Sister   . Diabetes Sister   . Kidney disease Other        nephew  . Bladder Cancer Neg Hx     Social History Social History   Tobacco Use  . Smoking status: Former Smoker    Quit date: 05/26/1995    Years since quitting: 25.5  . Smokeless tobacco: Never Used  Substance Use Topics  . Alcohol use: No  . Drug use: Never    Review of Systems Unable to assess  ____________________________________________   PHYSICAL EXAM:  VITAL SIGNS: ED  Triage Vitals  Enc Vitals Group     BP 11/25/20 0833 (!) 168/95     Pulse Rate 11/25/20 0831 71     Resp 11/25/20 0831 16     Temp 11/25/20 0831 97.8 F (36.6 C)     Temp Source 11/25/20 0831 Oral     SpO2 11/25/20 0831 (!) 86 %     Weight --      Height --      Head Circumference --      Peak Flow --      Pain Score 11/25/20 0833 0     Pain Loc --  Pain Edu? --      Excl. in [place]? --    Constitutional: Alert and oriented only to self. Well appearing and in no acute distress. Eyes: Conjunctivae are normal. PERRL. Head: Atraumatic. Nose: No congestion/rhinnorhea. Mouth/Throat: Mucous membranes are moist.  Blood inside the anterior oral Cavity with upper dentures and mandibular teeth partially edentulous and a large blood clot to the right aspect of her lower incisors Neck: No stridor Cardiovascular: Grossly normal heart sounds.  Good peripheral circulation. Respiratory: Normal respiratory effort.  No retractions. Gastrointestinal: Soft and nontender. No distention. Musculoskeletal: No obvious deformities Neurologic:  Normal speech and language. No gross focal neurologic deficits are appreciated. Skin:  Skin is warm and dry. No rash noted. Psychiatric: Mood and affect are normal. Speech and behavior are normal.  ____________________________________________   LABS (all labs ordered are listed, but only abnormal results are displayed)  Labs Reviewed  CBC WITH DIFFERENTIAL/PLATELET - Abnormal; Notable for the following components:      Result Value   WBC 11.8 (*)    Neutro Abs 7.8 (*)    All other components within normal limits  PROTIME-INR  TYPE AND SCREEN    RADIOLOGY  ED MD interpretation: Single view portable chest x-ray shows no evidence of acute abnormalities.  CT without contrast of the head shows no evidence of acute abnormalities including no intracranial hemorrhage, edema, mass-effect, or calvarial fracture  CT of the maxillofacial structures show no  evidence of acute fracture or dislocation  CT of the cervical spine shows no evidence of acute fractures, dislocations, or significant spinal disruption  Official radiology report(s): CT Head Wo Contrast  Result Date: 11/25/2020 CLINICAL DATA:  Unwitnessed fall EXAM: CT HEAD WITHOUT CONTRAST TECHNIQUE: Contiguous axial images were obtained from the base of the skull through the vertex without intravenous contrast. COMPARISON:  10/05/2020 FINDINGS: Brain: There is atrophy and chronic small vessel disease changes. No acute intracranial abnormality. Specifically, no hemorrhage, hydrocephalus, mass lesion, acute infarction, or significant intracranial injury. Vascular: No hyperdense vessel or unexpected calcification. Skull: No acute calvarial abnormality. Sinuses/Orbits: Visualized paranasal sinuses and mastoids clear. Orbital soft tissues unremarkable. Other: None IMPRESSION: Atrophy, chronic microvascular disease. No acute intracranial abnormality. Electronically Signed   By: Rolm Baptise M.D.   On: 11/25/2020 10:38   CT Cervical Spine Wo Contrast  Result Date: 11/25/2020 CLINICAL DATA:  Fall EXAM: CT CERVICAL SPINE WITHOUT CONTRAST TECHNIQUE: Multidetector CT imaging of the cervical spine was performed without intravenous contrast. Multiplanar CT image reconstructions were also generated. COMPARISON:  None. FINDINGS: Alignment: Degenerative anterolisthesis of C3 on C4, 3 mm. Degenerative anterolisthesis of C7 on T1, 2 mm. Skull base and vertebrae: No acute fracture. No primary bone lesion or focal pathologic process. Soft tissues and spinal canal: No prevertebral fluid or swelling. No visible canal hematoma. Disc levels:  Diffuse degenerative disc and facet disease. Upper chest: Biapical scarring. Other: 1.7 cm low-density nodule in the right thyroid lobe. In the setting of significant comorbidities or limited life expectancy, no follow-up recommended (ref: J Am Coll Radiol. 2015 Feb;12(2): 143-50).  IMPRESSION: Degenerative disc and facet disease throughout the cervical spine. No acute bony abnormality. Electronically Signed   By: Rolm Baptise M.D.   On: 11/25/2020 10:41   DG Chest Port 1 View  Result Date: 11/25/2020 CLINICAL DATA:  Shortness of breath EXAM: PORTABLE CHEST 1 VIEW COMPARISON:  08/19/2020 FINDINGS: Heart size is mildly enlarged, unchanged. Atherosclerotic calcification of the aortic knob. No focal airspace consolidation, pleural effusion, or pneumothorax.  No acute osseous findings. IMPRESSION: No active disease. Electronically Signed   By: Davina Poke D.O.   On: 11/25/2020 09:14   CT Maxillofacial WO CM  Result Date: 11/25/2020 CLINICAL DATA:  Fall, bleeding from mouth EXAM: CT MAXILLOFACIAL WITHOUT CONTRAST TECHNIQUE: Multidetector CT imaging of the maxillofacial structures was performed. Multiplanar CT image reconstructions were also generated. COMPARISON:  None. FINDINGS: Osseous: No fracture or mandibular dislocation. No destructive process. Orbits: Negative. No traumatic or inflammatory finding. Sinuses: Clear Soft tissues: Negative Limited intracranial: See head CT report. IMPRESSION: No evidence of facial or orbital fracture. Electronically Signed   By: Rolm Baptise M.D.   On: 11/25/2020 10:40    ____________________________________________   PROCEDURES  Procedure(s) performed (including Critical Care):  Procedures   ____________________________________________   INITIAL IMPRESSION / ASSESSMENT AND PLAN / ED COURSE  As part of my medical decision making, I reviewed the following data within the [place] notes reviewed and incorporated, Labs reviewed, Old chart reviewed, Radiograph reviewed and Notes from prior ED visits reviewed and incorporated        Patient is a 86 year old female that presents with the above-stated past medical history.  Physical exam shows bleeding to the right lateral aspect of patient's lower incisors.   Differential diagnosis includes but is not limited to: Thrombocytopenia related bleeding, blood dyscrasia, DIC, TTP Bleeding resolved spontaneously.  Laboratory evaluation did not show any evidence of elevated INR or thrombocytopenia.  Patient's daughter at bedside during reevaluation states that this is happened to the patient before and they are currently in the process of attempting to remove patient's lower teeth via the dental surgeon.  1112 Reassessment: The patient has been reexamined and is ready to be discharged.  All diagnostic results have been reviewed and discussed with the patient/family.  Care plan has been outlined and the patient/family understands all current diagnoses, results, and treatment plans.  There are no new complaints, changes, or physical findings at this time.  All questions have been addressed and answered.  All medications, if any, that were given while in the emergency department or any that are being prescribed have been reviewed with the patient/family.  All side effects and adverse reactions have been explained.  Patient was instructed to, and agrees to follow-up with their primary care physician as well as return to the emergency department if any new or worsening symptoms develop.  Dispo: Discharge home with home care     ____________________________________________   FINAL CLINICAL IMPRESSION(S) / ED DIAGNOSES  Final diagnoses:  Oral bleeding     ED Discharge Orders    None       Note:  This document was prepared using Dragon voice recognition software and may include unintentional dictation errors.   Naaman Plummer, MD 11/25/20 [phone]

## 2020-11-25 NOTE — ED Notes (Signed)
Pt in CT.

## 2020-11-25 NOTE — ED Notes (Signed)
Patients daughter reports she can take patient back to facility, no transport needed.

## 2021-01-22 ENCOUNTER — Emergency Department
Admission: EM | Admit: 2021-01-22 | Discharge: 2021-01-22 | Disposition: A | Discharge status: Home / Self Care | Payer: Medicare HMO | Attending: Emergency Medicine | Admitting: Emergency Medicine

## 2021-01-22 ENCOUNTER — Encounter: Payer: Self-pay | Admitting: Emergency Medicine

## 2021-01-22 ENCOUNTER — Emergency Department: Payer: Medicare HMO

## 2021-01-22 DIAGNOSIS — W19XXXA Unspecified fall, initial encounter: Secondary | ICD-10-CM | POA: Insufficient documentation

## 2021-01-22 DIAGNOSIS — Z7951 Long term (current) use of inhaled steroids: Secondary | ICD-10-CM | POA: Diagnosis not present

## 2021-01-22 DIAGNOSIS — Z87891 Personal history of nicotine dependence: Secondary | ICD-10-CM | POA: Diagnosis not present

## 2021-01-22 DIAGNOSIS — J449 Chronic obstructive pulmonary disease, unspecified: Secondary | ICD-10-CM | POA: Insufficient documentation

## 2021-01-22 DIAGNOSIS — Z79899 Other long term (current) drug therapy: Secondary | ICD-10-CM | POA: Insufficient documentation

## 2021-01-22 DIAGNOSIS — Y9301 Activity, walking, marching and hiking: Secondary | ICD-10-CM | POA: Diagnosis not present

## 2021-01-22 DIAGNOSIS — Z7982 Long term (current) use of aspirin: Secondary | ICD-10-CM | POA: Diagnosis not present

## 2021-01-22 DIAGNOSIS — I10 Essential (primary) hypertension: Secondary | ICD-10-CM | POA: Diagnosis not present

## 2021-01-22 DIAGNOSIS — S0990XA Unspecified injury of head, initial encounter: Secondary | ICD-10-CM | POA: Insufficient documentation

## 2021-01-22 NOTE — ED Triage Notes (Signed)
Patient from [facility] via [facility] with complaints of unwitnessed fall   Per EMS pt is ambulatory on scene, VS WNL, and pt's room mate reports pt didn't hit head or LOC, pt hx of dementia  Pt oriented to self and situation, pt denies pain on palpation to head, neck and bony prominences (shoulders, elbows, wrists, hips, knees, and ankles); no obvious injuries or deformities

## 2021-01-22 NOTE — ED Notes (Signed)
Pt was found on the floor at snf. Pt stated legs gave out. Pt denies pain at this time.

## 2021-01-22 NOTE — ED Provider Notes (Signed)
[facility]-EMERGENCY DEPARTMENT  ____________________________________________  Time seen: Approximately 8:37 PM  I have reviewed the triage vital signs and the nursing notes.   HISTORY  Chief Complaint Fall   Historian Patient    HPI Victoria Esparza is a 87 y.o. female presents to the emergency department after patient had a witnessed fall.  Patient's roommate states that patient cannot bear head or lose consciousness.  Patient denies any pain.  She typically walks with a walker and has been able to ambulate since injury occurred.  She has no abrasions or lacerations.  Patient is requesting to go home.   Past Medical History:  Diagnosis Date  . A-fib ([place])   . Arthritis   . Cognitive dysfunction   . Colon adenoma   . COPD (chronic obstructive pulmonary disease) ([place])   . Depression   . Familial tremor   . HLD (hyperlipidemia)   . Hypertension   . Osteoarthritis   . Osteoporosis   . Ovarian cyst   . Psoriatic arthropathy ([place])   . Rotator cuff syndrome   . Sigmoid diverticulitis      Immunizations up to date:  Yes.     Past Medical History:  Diagnosis Date  . A-fib ([place])   . Arthritis   . Cognitive dysfunction   . Colon adenoma   . COPD (chronic obstructive pulmonary disease) ([place])   . Depression   . Familial tremor   . HLD (hyperlipidemia)   . Hypertension   . Osteoarthritis   . Osteoporosis   . Ovarian cyst   . Psoriatic arthropathy ([place])   . Rotator cuff syndrome   . Sigmoid diverticulitis     Patient Active Problem List   Diagnosis Date Noted  . Psoriasis with arthropathy ([place]) 01/05/2016  . Benign essential HTN 09/08/2015  . Paroxysmal atrial fibrillation ([place]) 08/31/2015  . OP (osteoporosis) 04/20/2015    Past Surgical History:  Procedure Laterality Date  . ABDOMINAL HYSTERECTOMY    . COLONOSCOPY      Prior to Admission medications   Medication Sig Start Date End Date Taking? Authorizing Provider  albuterol (VENTOLIN HFA) 108 (90  Base) MCG/ACT inhaler Inhale 2 puffs into the lungs every 6 (six) hours as needed for up to 7 days for wheezing or shortness of breath. 08/10/19 08/17/19  Arta Silence, MD  alendronate (FOSAMAX) 70 MG tablet Take 70 mg by mouth once a week. Reported on 01/22/2016    [provider]  aspirin 325 MG EC tablet Take 325 mg by mouth daily.    [provider]  calcium-vitamin D (SM CALCIUM 500/VITAMIN D3) 500-400 MG-UNIT tablet Take by mouth.    [provider]  cefpodoxime (VANTIN) 100 MG tablet Take 1 tablet (100 mg total) by mouth 2 (two) times daily. Patient not taking: Reported on 09/01/2019 04/24/18   Carrie Mew, MD  cephALEXin (KEFLEX) 500 MG capsule Take 1 capsule (500 mg total) by mouth 2 (two) times daily. Patient not taking: Reported on 08/19/2020 09/01/19   Harvest Dark, MD  citalopram (CELEXA) 10 MG tablet Take 10 mg by mouth daily.    [provider]  docusate sodium (COLACE) 100 MG capsule Take 100 mg by mouth daily. Patient not taking: Reported on 08/19/2020    [provider]  ENBREL SURECLICK 50 MG/ML injection Inject 0.8 mLs into the muscle once a week. 08/21/19   [provider]  estradiol (ESTRACE) 0.1 MG/GM vaginal cream INSERT 1 GRAM VAGINALLY 3 TIMES A WEEK. PEA SIZED AMOUNT  PER URETHRA Patient not taking: Reported on 09/01/2019 02/14/17   Hollice Espy, MD  fluticasone [facility]) 50 MCG/ACT nasal spray Place 2 sprays into both nostrils daily. Patient not taking: Reported on 08/19/2020    [provider]  folic acid (FOLVITE) 1 MG tablet Take 1 mg by mouth daily. Patient not taking: Reported on 08/19/2020    [provider]  gabapentin (NEURONTIN) 300 MG capsule Take 300 mg by mouth daily. 08/17/19   [provider]  Melatonin 5 MG TABS Take 5 mg by mouth at bedtime as needed (sleep).    [provider]  Methotrexate, PF, 25 MG/0.4ML SOAJ Inject 0.8 mLs into the skin once a week. Monday  0800    [provider]  metoprolol tartrate (LOPRESSOR) 25 MG tablet Take 25 mg by mouth 2 (two) times daily.    [provider]  mirtazapine (REMERON) 15 MG tablet Take 15 mg by mouth at bedtime.    [provider]  naproxen sodium (RA NAPROXEN SODIUM) 220 MG tablet Take by mouth. Reported on 01/22/2016    [provider]  Omega-3 Fatty Acids (FISH OIL PO) Take by mouth.    [provider]  ondansetron (ZOFRAN ODT) 4 MG disintegrating tablet Take 1 tablet (4 mg total) by mouth every 8 (eight) hours as needed for nausea or vomiting. Patient not taking: Reported on 09/01/2019 04/24/18   Carrie Mew, MD  polyethylene glycol powder [facility]) 17 GM/SCOOP powder 1 cap full in a full glass of water, two times a day for 3 days. Patient not taking: Reported on 08/19/2020 05/14/20   Carrie Mew, MD  senna-docusate (SENOKOT-S) 8.6-50 MG tablet Take 2 tablets by mouth 2 (two) times daily. 05/14/20   Carrie Mew, MD  sodium chloride (OCEAN) 0.65 % SOLN nasal spray Place 1 spray into both nostrils as needed for congestion.    [provider]  traMADol (ULTRAM) 50 MG tablet Take 50 mg by mouth every 6 (six) hours as needed. Patient not taking: Reported on 08/19/2020    [provider]  vitamin B-12 (CYANOCOBALAMIN) 1000 MCG tablet Take 1,000 mcg by mouth daily.    [provider]    Allergies Patient has no known allergies.  Family History  Problem Relation Age of Onset  . Diabetes Mother   . Ovarian cancer Mother   . Lung cancer Father   . Aneurysm Brother   . Diabetes Brother   . Breast cancer Sister   . Colon cancer Sister   . Diabetes Sister   . Kidney disease Other        nephew  . Bladder Cancer Neg Hx     Social History Social History   Tobacco Use  . Smoking status: Former Smoker    Quit date: 05/26/1995    Years since quitting: 25.6  . Smokeless tobacco: Never Used  Substance Use Topics  .  Alcohol use: No  . Drug use: Never     Review of Systems  Constitutional: No fever/chills Eyes:  No discharge ENT: No upper respiratory complaints. Respiratory: no cough. No SOB/ use of accessory muscles to breath Gastrointestinal:   No nausea, no vomiting.  No diarrhea.  No constipation. Musculoskeletal: Negative for musculoskeletal pain. Skin: Negative for rash, abrasions, lacerations, ecchymosis.    ____________________________________________   PHYSICAL EXAM:  VITAL SIGNS: ED Triage Vitals  Enc Vitals Group     BP 01/22/21 1916 122/63     Pulse Rate 01/22/21 1916 65  Resp 01/22/21 1916 18     Temp 01/22/21 1916 98 F (36.7 C)     Temp Source 01/22/21 1916 Oral     SpO2 01/22/21 1916 99 %     Weight 01/22/21 1917 140 lb (63.5 kg)     Height 01/22/21 1917 5\' 5"  (1.651 m)     Head Circumference --      Peak Flow --      Pain Score 01/22/21 1917 0     Pain Loc --      Pain Edu? --      Excl. in [place]? --      Constitutional: Alert and oriented. Well appearing and in no acute distress. Eyes: Conjunctivae are normal. PERRL. EOMI. Head: Atraumatic. ENT:      Nose: No congestion/rhinnorhea.      Mouth/Throat: Mucous membranes are moist.  Neck: No stridor. FROM.  Cardiovascular: Normal rate, regular rhythm. Normal S1 and S2.  Good peripheral circulation. Respiratory: Normal respiratory effort without tachypnea or retractions. Lungs CTAB. Good air entry to the bases with no decreased or absent breath sounds Gastrointestinal: Bowel sounds x 4 quadrants. Soft and nontender to palpation. No guarding or rigidity. No distention. Musculoskeletal: Full range of motion to all extremities. No obvious deformities noted.  No pain with internal and external rotation of the bilateral hips. Neurologic:  Normal for age. No gross focal neurologic deficits are appreciated.  Skin:  Skin is warm, dry and intact. No rash noted. Psychiatric: Mood and affect are normal for age. Speech and  behavior are normal.   ____________________________________________   LABS (all labs ordered are listed, but only abnormal results are displayed)  Labs Reviewed - No data to display ____________________________________________  EKG   ____________________________________________  RADIOLOGY Unk Pinto, personally viewed and evaluated these images (plain radiographs) as part of my medical decision making, as well as reviewing the written report by the radiologist.  CT Head Wo Contrast  Result Date: 01/22/2021 CLINICAL DATA:  Unwitnessed fall. EXAM: CT HEAD WITHOUT CONTRAST CT CERVICAL SPINE WITHOUT CONTRAST TECHNIQUE: Multidetector CT imaging of the head and cervical spine was performed following the standard protocol without intravenous contrast. Multiplanar CT image reconstructions of the cervical spine were also generated. COMPARISON:  CT head C-spine 11/25/2020 FINDINGS: CT HEAD FINDINGS Brain: Cerebral ventricle sizes are concordant with the degree of cerebral volume loss. Patchy and confluent areas of decreased attenuation are noted throughout the deep and periventricular white matter of the cerebral hemispheres bilaterally, compatible with chronic microvascular ischemic disease. No evidence of large-territorial acute infarction. No parenchymal hemorrhage. No mass lesion. No extra-axial collection. No mass effect or midline shift. No hydrocephalus. Basilar cisterns are patent. Vascular: No hyperdense vessel. Skull: No acute fracture or focal lesion. Sinuses/Orbits: Paranasal sinuses and mastoid air cells are clear. Bilateral lens replacement. Otherwise the orbits are unremarkable. Other: None. CT CERVICAL SPINE FINDINGS Alignment: Stable grade 1 anterolisthesis of C3 on C4. Stable grade 1 anterolisthesis of C7 on T1. Skull base and vertebrae: Multilevel in degenerative changes of the spine worse at the C4 through C5 levels. No acute fracture. No aggressive appearing focal osseous lesion  or focal pathologic process. Soft tissues and spinal canal: No prevertebral fluid or swelling. No visible canal hematoma. Disc levels: Multilevel intervertebral disc space narrowing worse at the C4-C5 and C5-C6 levels. Upper chest: Biapical paraseptal emphysematous changes. Biapical pleural/pulmonary scarring. Debris within the esophagus. Other: Atherosclerotic plaque of the carotid arteries. Redemonstration of a heterogeneous and enlarged right thyroid  gland with associated coarse calcification as well as a 1.9 cm hypodense nodule. The left thyroid gland is nonenlarged but is noted to be heterogeneous. IMPRESSION: 1. No acute intracranial abnormality. 2. No acute displaced fracture or traumatic listhesis of the cervical spine. 3. A 1.9 cm right thyroid gland nodule. In the setting of significant comorbidities or limited life expectancy, no follow-up recommended (ref: J Am Coll Radiol. 2015 Feb;12(2): 143-50). 4.  Emphysema (ICD10-J43.9). Electronically Signed   By: Iven Finn M.D.   On: 01/22/2021 20:11   CT Cervical Spine Wo Contrast  Result Date: 01/22/2021 CLINICAL DATA:  Unwitnessed fall. EXAM: CT HEAD WITHOUT CONTRAST CT CERVICAL SPINE WITHOUT CONTRAST TECHNIQUE: Multidetector CT imaging of the head and cervical spine was performed following the standard protocol without intravenous contrast. Multiplanar CT image reconstructions of the cervical spine were also generated. COMPARISON:  CT head C-spine 11/25/2020 FINDINGS: CT HEAD FINDINGS Brain: Cerebral ventricle sizes are concordant with the degree of cerebral volume loss. Patchy and confluent areas of decreased attenuation are noted throughout the deep and periventricular white matter of the cerebral hemispheres bilaterally, compatible with chronic microvascular ischemic disease. No evidence of large-territorial acute infarction. No parenchymal hemorrhage. No mass lesion. No extra-axial collection. No mass effect or midline shift. No hydrocephalus.  Basilar cisterns are patent. Vascular: No hyperdense vessel. Skull: No acute fracture or focal lesion. Sinuses/Orbits: Paranasal sinuses and mastoid air cells are clear. Bilateral lens replacement. Otherwise the orbits are unremarkable. Other: None. CT CERVICAL SPINE FINDINGS Alignment: Stable grade 1 anterolisthesis of C3 on C4. Stable grade 1 anterolisthesis of C7 on T1. Skull base and vertebrae: Multilevel in degenerative changes of the spine worse at the C4 through C5 levels. No acute fracture. No aggressive appearing focal osseous lesion or focal pathologic process. Soft tissues and spinal canal: No prevertebral fluid or swelling. No visible canal hematoma. Disc levels: Multilevel intervertebral disc space narrowing worse at the C4-C5 and C5-C6 levels. Upper chest: Biapical paraseptal emphysematous changes. Biapical pleural/pulmonary scarring. Debris within the esophagus. Other: Atherosclerotic plaque of the carotid arteries. Redemonstration of a heterogeneous and enlarged right thyroid gland with associated coarse calcification as well as a 1.9 cm hypodense nodule. The left thyroid gland is nonenlarged but is noted to be heterogeneous. IMPRESSION: 1. No acute intracranial abnormality. 2. No acute displaced fracture or traumatic listhesis of the cervical spine. 3. A 1.9 cm right thyroid gland nodule. In the setting of significant comorbidities or limited life expectancy, no follow-up recommended (ref: J Am Coll Radiol. 2015 Feb;12(2): 143-50). 4.  Emphysema (ICD10-J43.9). Electronically Signed   By: Iven Finn M.D.   On: 01/22/2021 20:11    ____________________________________________    PROCEDURES  Procedure(s) performed:     Procedures     Medications - No data to display   ____________________________________________   INITIAL IMPRESSION / ASSESSMENT AND PLAN / ED COURSE  Pertinent labs & imaging results that were available during my care of the patient were reviewed by me and  considered in my medical decision making (see chart for details).    Assessment and Plan:  Fall 87 year old female presents to the emergency department after a mechanical, nonsyncopal fall that was witnessed by her roommate.  Vital signs were reassuring at triage and patient had no neuro deficits on exam.  CT head and CT cervical spine revealed no evidence of intracranial bleed, skull fracture or C-spine fracture.     ____________________________________________  FINAL CLINICAL IMPRESSION(S) / ED DIAGNOSES  Final diagnoses:  Fall, initial  encounter      NEW MEDICATIONS STARTED DURING THIS VISIT:  ED Discharge Orders    None          This chart was dictated using voice recognition software/Dragon. Despite best efforts to proofread, errors can occur which can change the meaning. Any change was purely unintentional.     Karren Cobble 01/22/21 2044    Nance Pear, MD 01/22/21 [phone]

## 2021-03-30 ENCOUNTER — Other Ambulatory Visit: Payer: Self-pay

## 2021-03-30 ENCOUNTER — Emergency Department: Payer: Medicare HMO

## 2021-03-30 ENCOUNTER — Encounter: Payer: Self-pay | Admitting: Emergency Medicine

## 2021-03-30 ENCOUNTER — Inpatient Hospital Stay
Admission: EM | Admit: 2021-03-30 | Discharge: 2021-04-06 | DRG: 521 | Disposition: A | Discharge status: Skilled Nursing Facility | Payer: Medicare HMO | Attending: Internal Medicine | Admitting: Internal Medicine

## 2021-03-30 DIAGNOSIS — D72829 Elevated white blood cell count, unspecified: Secondary | ICD-10-CM | POA: Diagnosis present

## 2021-03-30 DIAGNOSIS — Y92129 Unspecified place in nursing home as the place of occurrence of the external cause: Secondary | ICD-10-CM | POA: Diagnosis not present

## 2021-03-30 DIAGNOSIS — J9602 Acute respiratory failure with hypercapnia: Secondary | ICD-10-CM | POA: Diagnosis not present

## 2021-03-30 DIAGNOSIS — I9581 Postprocedural hypotension: Secondary | ICD-10-CM | POA: Diagnosis not present

## 2021-03-30 DIAGNOSIS — M810 Age-related osteoporosis without current pathological fracture: Secondary | ICD-10-CM | POA: Diagnosis present

## 2021-03-30 DIAGNOSIS — S72011A Unspecified intracapsular fracture of right femur, initial encounter for closed fracture: Principal | ICD-10-CM | POA: Diagnosis present

## 2021-03-30 DIAGNOSIS — F32A Depression, unspecified: Secondary | ICD-10-CM | POA: Diagnosis present

## 2021-03-30 DIAGNOSIS — S72001A Fracture of unspecified part of neck of right femur, initial encounter for closed fracture: Secondary | ICD-10-CM | POA: Diagnosis not present

## 2021-03-30 DIAGNOSIS — J449 Chronic obstructive pulmonary disease, unspecified: Principal | ICD-10-CM | POA: Diagnosis present

## 2021-03-30 DIAGNOSIS — R52 Pain, unspecified: Secondary | ICD-10-CM

## 2021-03-30 DIAGNOSIS — Z7982 Long term (current) use of aspirin: Secondary | ICD-10-CM | POA: Diagnosis not present

## 2021-03-30 DIAGNOSIS — Z87891 Personal history of nicotine dependence: Secondary | ICD-10-CM

## 2021-03-30 DIAGNOSIS — F0390 Unspecified dementia without behavioral disturbance: Secondary | ICD-10-CM | POA: Diagnosis present

## 2021-03-30 DIAGNOSIS — L4050 Arthropathic psoriasis, unspecified: Secondary | ICD-10-CM | POA: Diagnosis present

## 2021-03-30 DIAGNOSIS — W1830XA Fall on same level, unspecified, initial encounter: Secondary | ICD-10-CM | POA: Diagnosis present

## 2021-03-30 DIAGNOSIS — I10 Essential (primary) hypertension: Secondary | ICD-10-CM

## 2021-03-30 DIAGNOSIS — Z79818 Long term (current) use of other agents affecting estrogen receptors and estrogen levels: Secondary | ICD-10-CM

## 2021-03-30 DIAGNOSIS — Z7983 Long term (current) use of bisphosphonates: Secondary | ICD-10-CM

## 2021-03-30 DIAGNOSIS — R571 Hypovolemic shock: Secondary | ICD-10-CM | POA: Diagnosis not present

## 2021-03-30 DIAGNOSIS — A419 Sepsis, unspecified organism: Secondary | ICD-10-CM

## 2021-03-30 DIAGNOSIS — J9601 Acute respiratory failure with hypoxia: Secondary | ICD-10-CM | POA: Diagnosis not present

## 2021-03-30 DIAGNOSIS — D62 Acute posthemorrhagic anemia: Secondary | ICD-10-CM | POA: Diagnosis not present

## 2021-03-30 DIAGNOSIS — I480 Paroxysmal atrial fibrillation: Secondary | ICD-10-CM

## 2021-03-30 DIAGNOSIS — Z6822 Body mass index (BMI) 22.0-22.9, adult: Secondary | ICD-10-CM | POA: Diagnosis not present

## 2021-03-30 DIAGNOSIS — Z20822 Contact with and (suspected) exposure to covid-19: Secondary | ICD-10-CM | POA: Diagnosis present

## 2021-03-30 DIAGNOSIS — R4189 Other symptoms and signs involving cognitive functions and awareness: Secondary | ICD-10-CM

## 2021-03-30 DIAGNOSIS — Z79899 Other long term (current) drug therapy: Secondary | ICD-10-CM

## 2021-03-30 DIAGNOSIS — R64 Cachexia: Secondary | ICD-10-CM | POA: Diagnosis present

## 2021-03-30 DIAGNOSIS — K5790 Diverticulosis of intestine, part unspecified, without perforation or abscess without bleeding: Secondary | ICD-10-CM | POA: Diagnosis present

## 2021-03-30 DIAGNOSIS — E785 Hyperlipidemia, unspecified: Secondary | ICD-10-CM | POA: Diagnosis present

## 2021-03-30 DIAGNOSIS — Z9889 Other specified postprocedural states: Secondary | ICD-10-CM

## 2021-03-30 LAB — COMPREHENSIVE METABOLIC PANEL
ALT: 13 U/L (ref 0–44)
AST: 22 U/L (ref 15–41)
Albumin: 3.3 g/dL — ABNORMAL LOW (ref 3.5–5.0)
Alkaline Phosphatase: 61 U/L (ref 38–126)
Anion gap: 9 (ref 5–15)
BUN: 17 mg/dL (ref 8–23)
CO2: 27 mmol/L (ref 22–32)
Calcium: 9.2 mg/dL (ref 8.9–10.3)
Chloride: 99 mmol/L (ref 98–111)
Creatinine, Ser: 0.84 mg/dL (ref 0.44–1.00)
GFR, Estimated: >60 mL/min (ref >60)
Glucose, Bld: 95 mg/dL (ref 70–99)
Potassium: 4.3 mmol/L (ref 3.5–5.1)
Sodium: 135 mmol/L (ref 135–145)
Total Bilirubin: 0.6 mg/dL (ref 0.3–1.2)
Total Protein: 6.8 g/dL (ref 6.5–8.1)

## 2021-03-30 LAB — CBC WITH DIFFERENTIAL/PLATELET
Abs Immature Granulocytes: 0.16 10*3/uL — ABNORMAL HIGH (ref 0.00–0.07)
Basophils Absolute: 0.1 10*3/uL (ref 0.0–0.1)
Basophils Relative: 0 %
Eosinophils Absolute: 0.1 10*3/uL (ref 0.0–0.5)
Eosinophils Relative: 1 %
HCT: 40.1 % (ref 36.0–46.0)
Hemoglobin: 13 g/dL (ref 12.0–15.0)
Immature Granulocytes: 1 %
Lymphocytes Relative: 11 %
Lymphs Abs: 2.3 10*3/uL (ref 0.7–4.0)
MCH: 29.5 pg (ref 26.0–34.0)
MCHC: 32.4 g/dL (ref 30.0–36.0)
MCV: 90.9 fL (ref 80.0–100.0)
Monocytes Absolute: 1.3 10*3/uL — ABNORMAL HIGH (ref 0.1–1.0)
Monocytes Relative: 6 %
Neutro Abs: 16.7 10*3/uL — ABNORMAL HIGH (ref 1.7–7.7)
Neutrophils Relative %: 81 %
Platelets: 161 10*3/uL (ref 150–400)
RBC: 4.41 MIL/uL (ref 3.87–5.11)
RDW: 16.1 % — ABNORMAL HIGH (ref 11.5–15.5)
WBC: 20.7 10*3/uL — ABNORMAL HIGH (ref 4.0–10.5)
nRBC: 0 % (ref 0.0–0.2)

## 2021-03-30 LAB — RESP PANEL BY RT-PCR (FLU A&B, COVID) ARPGX2
Influenza A by PCR: NEGATIVE
Influenza B by PCR: NEGATIVE
SARS Coronavirus 2 by RT PCR: NEGATIVE

## 2021-03-30 LAB — URINALYSIS, COMPLETE (UACMP) WITH MICROSCOPIC
Bacteria, UA: NONE SEEN
Bilirubin Urine: NEGATIVE
Glucose, UA: NEGATIVE mg/dL
Hgb urine dipstick: NEGATIVE
Ketones, ur: 5 mg/dL — AB
Leukocytes,Ua: NEGATIVE
Nitrite: NEGATIVE
Protein, ur: NEGATIVE mg/dL
Specific Gravity, Urine: 1.02 (ref 1.005–1.030)
pH: 7 (ref 5.0–8.0)

## 2021-03-30 MED ORDER — METOPROLOL TARTRATE 25 MG PO TABS: 25.0000 mg | ORAL_TABLET | Freq: Two times a day (BID) | ORAL | Status: DC

## 2021-03-30 MED ORDER — CALCIUM CARBONATE-VITAMIN D 500-200 MG-UNIT PO TABS
1.0000 | ORAL_TABLET | Freq: Every day | ORAL | Status: DC
  Administered 2021-03-31 – 2021-04-06 (×7): 1 via ORAL
  Filled 2021-03-30 (×7): qty 1

## 2021-03-30 MED ORDER — MELATONIN 5 MG PO TABS: 5.0000 mg | ORAL_TABLET | Freq: Every evening | ORAL | Status: DC | PRN

## 2021-03-30 MED ORDER — POLYETHYLENE GLYCOL 3350 17 G PO PACK
17.0000 g | PACK | Freq: Every day | ORAL | Status: DC | PRN
  Administered 2021-04-05 – 2021-04-06 (×2): 17 g via ORAL
  Filled 2021-03-30 (×2): qty 1

## 2021-03-30 MED ORDER — METOPROLOL TARTRATE 25 MG PO TABS
25.0000 mg | ORAL_TABLET | Freq: Two times a day (BID) | ORAL | Status: DC
  Administered 2021-03-31 (×2): 25 mg via ORAL
  Filled 2021-03-30 (×2): qty 1

## 2021-03-30 MED ORDER — ALBUTEROL SULFATE HFA 108 (90 BASE) MCG/ACT IN AERS
2.0000 | INHALATION_SPRAY | Freq: Four times a day (QID) | RESPIRATORY_TRACT | Status: DC | PRN
  Filled 2021-03-30: qty 6.7

## 2021-03-30 MED ORDER — SALINE SPRAY 0.65 % NA SOLN
1.0000 | NASAL | Status: DC | PRN
  Filled 2021-03-30: qty 44

## 2021-03-30 MED ORDER — CITALOPRAM HYDROBROMIDE 10 MG PO TABS
10.0000 mg | ORAL_TABLET | Freq: Every day | ORAL | Status: DC
  Administered 2021-03-31 – 2021-04-06 (×7): 10 mg via ORAL
  Filled 2021-03-30 (×4): qty 1
  Filled 2021-03-30: qty 0.5
  Filled 2021-03-30 (×4): qty 1

## 2021-03-30 MED ORDER — MIRTAZAPINE 15 MG PO TABS
15.0000 mg | ORAL_TABLET | Freq: Every day | ORAL | Status: DC
  Administered 2021-03-31 – 2021-04-05 (×6): 15 mg via ORAL
  Filled 2021-03-30 (×6): qty 1

## 2021-03-30 MED ORDER — SENNOSIDES-DOCUSATE SODIUM 8.6-50 MG PO TABS
2.0000 | ORAL_TABLET | Freq: Two times a day (BID) | ORAL | Status: DC
  Administered 2021-03-31 – 2021-04-06 (×13): 2 via ORAL
  Filled 2021-03-30 (×13): qty 2

## 2021-03-30 MED ORDER — HYDROCODONE-ACETAMINOPHEN 5-325 MG PO TABS
1.0000 | ORAL_TABLET | Freq: Four times a day (QID) | ORAL | Status: DC | PRN
  Administered 2021-03-31: 1 via ORAL
  Filled 2021-03-30: qty 1

## 2021-03-30 MED ORDER — GABAPENTIN 300 MG PO CAPS
300.0000 mg | ORAL_CAPSULE | Freq: Every day | ORAL | Status: DC
  Administered 2021-03-31: 300 mg via ORAL
  Filled 2021-03-30: qty 1

## 2021-03-30 MED ORDER — NAPROXEN 250 MG PO TABS
250.0000 mg | ORAL_TABLET | Freq: Every day | ORAL | Status: DC | PRN
  Administered 2021-03-31: 250 mg via ORAL
  Filled 2021-03-30 (×2): qty 1

## 2021-03-30 NOTE — ED Notes (Signed)
Pt's brief checked, pt dry at this time. Pt changed 2 hours ago, pt with wet brief.

## 2021-03-30 NOTE — ED Triage Notes (Signed)
Presents via EMS form [facility] s/p fall Per EMS she had a witnessed fall  Landed on right side   Having pain to right elbow and leg per EMS  On arrival to ED denies any pain

## 2021-03-30 NOTE — ED Notes (Signed)
Patient placed in hospital gown and purewick applied at this time.

## 2021-03-30 NOTE — ED Provider Notes (Signed)
[facility]-EMERGENCY DEPARTMENT  ____________________________________________  Time seen: Approximately 5:51 PM  I have reviewed the triage vital signs and the nursing notes.   HISTORY  Chief Complaint Fall   Historian Patient     HPI Victoria Esparza is a 87 y.o. female presents to the emergency department after patient had a mechanical fall at [facility].  Patient reports that she hit the back of her head.  She states that she initially had some right elbow discomfort but is since improved.  She is also complaining of right upper leg pain.  She has not attempted to bear weight since injury occurred.  No chest pain, chest tightness or abdominal pain.  No lacerations or abrasions.    Past Medical History:  Diagnosis Date  . A-fib ([place])   . Arthritis   . Cognitive dysfunction   . Colon adenoma   . COPD (chronic obstructive pulmonary disease) ([place])   . Depression   . Familial tremor   . HLD (hyperlipidemia)   . Hypertension   . Osteoarthritis   . Osteoporosis   . Ovarian cyst   . Psoriatic arthropathy ([place])   . Rotator cuff syndrome   . Sigmoid diverticulitis      Immunizations up to date:  Yes.     Past Medical History:  Diagnosis Date  . A-fib ([place])   . Arthritis   . Cognitive dysfunction   . Colon adenoma   . COPD (chronic obstructive pulmonary disease) ([place])   . Depression   . Familial tremor   . HLD (hyperlipidemia)   . Hypertension   . Osteoarthritis   . Osteoporosis   . Ovarian cyst   . Psoriatic arthropathy ([place])   . Rotator cuff syndrome   . Sigmoid diverticulitis     Patient Active Problem List   Diagnosis Date Noted  . Closed displaced fracture of right femoral neck ([place]) 03/30/2021  . Leukocytosis 03/30/2021  . Cognitive impairment 06/01/2019  . Psoriasis with arthropathy ([place]) 01/05/2016  . Benign essential HTN 09/08/2015  . Paroxysmal atrial fibrillation ([place]) 08/31/2015  . OP (osteoporosis) 04/20/2015    Past Surgical History:   Procedure Laterality Date  . ABDOMINAL HYSTERECTOMY    . COLONOSCOPY      Prior to Admission medications   Medication Sig Start Date End Date Taking? Authorizing Provider  albuterol (VENTOLIN HFA) 108 (90 Base) MCG/ACT inhaler Inhale 2 puffs into the lungs every 6 (six) hours as needed for up to 7 days for wheezing or shortness of breath. 08/10/19 08/17/19  Arta Silence, MD  alendronate (FOSAMAX) 70 MG tablet Take 70 mg by mouth once a week. Reported on 01/22/2016    [provider]  aspirin 325 MG EC tablet Take 325 mg by mouth daily.    [provider]  calcium-vitamin D (SM CALCIUM 500/VITAMIN D3) 500-400 MG-UNIT tablet Take by mouth.    [provider]  citalopram (CELEXA) 10 MG tablet Take 10 mg by mouth daily.    [provider]  docusate sodium (COLACE) 100 MG capsule Take 100 mg by mouth daily. Patient not taking: Reported on 08/19/2020    [provider]  ENBREL SURECLICK 50 MG/ML injection Inject 0.8 mLs into the muscle once a week. 08/21/19   [provider]  estradiol (ESTRACE) 0.1 MG/GM vaginal cream INSERT 1 GRAM VAGINALLY 3 TIMES A WEEK. PEA SIZED AMOUNT PER URETHRA Patient not taking: Reported on 09/01/2019 02/14/17   Hollice Espy, MD  gabapentin (NEURONTIN) 300 MG capsule Take  300 mg by mouth daily. 08/17/19   [provider]  Melatonin 5 MG TABS Take 5 mg by mouth at bedtime as needed (sleep).    [provider]  Methotrexate, PF, 25 MG/0.4ML SOAJ Inject 0.8 mLs into the skin once a week. Monday 0800    [provider]  metoprolol tartrate (LOPRESSOR) 25 MG tablet Take 25 mg by mouth 2 (two) times daily.    [provider]  mirtazapine (REMERON) 15 MG tablet Take 15 mg by mouth at bedtime.    [provider]  naproxen sodium (RA NAPROXEN SODIUM) 220 MG tablet Take by mouth. Reported on 01/22/2016    [provider]  Omega-3 Fatty Acids (FISH OIL PO) Take by mouth.     [provider]  ondansetron (ZOFRAN ODT) 4 MG disintegrating tablet Take 1 tablet (4 mg total) by mouth every 8 (eight) hours as needed for nausea or vomiting. Patient not taking: Reported on 09/01/2019 04/24/18   Carrie Mew, MD  senna-docusate (SENOKOT-S) 8.6-50 MG tablet Take 2 tablets by mouth 2 (two) times daily. 05/14/20   Carrie Mew, MD  sodium chloride (OCEAN) 0.65 % SOLN nasal spray Place 1 spray into both nostrils as needed for congestion.    [provider]  traMADol (ULTRAM) 50 MG tablet Take 50 mg by mouth every 6 (six) hours as needed. Patient not taking: Reported on 08/19/2020    [provider]    Allergies Patient has no known allergies.  Family History  Problem Relation Age of Onset  . Diabetes Mother   . Ovarian cancer Mother   . Lung cancer Father   . Aneurysm Brother   . Diabetes Brother   . Breast cancer Sister   . Colon cancer Sister   . Diabetes Sister   . Kidney disease Other        nephew  . Bladder Cancer Neg Hx     Social History Social History   Tobacco Use  . Smoking status: Former Smoker    Quit date: 05/26/1995    Years since quitting: 25.8  . Smokeless tobacco: Never Used  Substance Use Topics  . Alcohol use: No  . Drug use: Never     Review of Systems  Constitutional: No fever/chills Eyes:  No discharge ENT: No upper respiratory complaints. Respiratory: no cough. No SOB/ use of accessory muscles to breath Gastrointestinal:   No nausea, no vomiting.  No diarrhea.  No constipation. Musculoskeletal: Patient has right upper thigh pain.  Skin: Negative for rash, abrasions, lacerations, ecchymosis.    ____________________________________________   PHYSICAL EXAM:  VITAL SIGNS: ED Triage Vitals  Enc Vitals Group     BP 03/30/21 1647 (!) 133/50     Pulse Rate 03/30/21 1647 72     Resp 03/30/21 1647 18     Temp 03/30/21 1647 98.7 F (37.1 C)     Temp Source 03/30/21 1647 Oral     SpO2 03/30/21  1647 96 %     Weight 03/30/21 1644 141 lb 1.5 oz (64 kg)     Height 03/30/21 1644 5\' 6"  (1.676 m)     Head Circumference --      Peak Flow --      Pain Score 03/30/21 1644 0     Pain Loc --      Pain Edu? --      Excl. in [place]? --      Constitutional: Alert and oriented. Well appearing and in no acute  distress. Eyes: Conjunctivae are normal. PERRL. EOMI. Head: Atraumatic. ENT:      Nose: No congestion/rhinnorhea.      Mouth/Throat: Mucous membranes are moist.  Neck: No stridor.  FROM. No midline c spine tenderness to palpation.  Hematological/Lymphatic/Immunilogical: No cervical lymphadenopathy.  Cardiovascular: Normal rate, regular rhythm. Normal S1 and S2.  Good peripheral circulation. Respiratory: Normal respiratory effort without tachypnea or retractions. Lungs CTAB. Good air entry to the bases with no decreased or absent breath sounds Gastrointestinal: Bowel sounds x 4 quadrants. Soft and nontender to palpation. No guarding or rigidity. No distention. Musculoskeletal: Patient was unable to perform full range of motion at the right hip due to pain.  Palpable dorsalis pedis pulse, right.  Capillary refill less than 2 seconds on the right. Neurologic:  Normal for age. No gross focal neurologic deficits are appreciated.  Skin:  Skin is warm, dry and intact. No rash noted. Psychiatric: Mood and affect are normal for age. Speech and behavior are normal.   ____________________________________________   LABS (all labs ordered are listed, but only abnormal results are displayed)  Labs Reviewed  CBC WITH DIFFERENTIAL/PLATELET - Abnormal; Notable for the following components:      Result Value   WBC 20.7 (*)    RDW 16.1 (*)    Neutro Abs 16.7 (*)    Monocytes Absolute 1.3 (*)    Abs Immature Granulocytes 0.16 (*)    All other components within normal limits  COMPREHENSIVE METABOLIC PANEL - Abnormal; Notable for the following components:   Albumin 3.3 (*)    All other components  within normal limits  RESP PANEL BY RT-PCR (FLU A&B, COVID) ARPGX2  URINALYSIS, COMPLETE (UACMP) WITH MICROSCOPIC  CBC  BASIC METABOLIC PANEL  TYPE AND SCREEN   ____________________________________________  EKG   ____________________________________________  RADIOLOGY Unk Pinto, personally viewed and evaluated these images (plain radiographs) as part of my medical decision making, as well as reviewing the written report by the radiologist.    DG Chest 1 View  Result Date: 03/30/2021 CLINICAL DATA:  Fall. EXAM: CHEST  1 VIEW COMPARISON:  November 25, 2020. FINDINGS: The heart size and mediastinal contours are within normal limits. Both lungs are clear. The visualized skeletal structures are unremarkable. IMPRESSION: No active disease. Electronically Signed   By: Marijo Conception M.D.   On: 03/30/2021 18:41   CT Head Wo Contrast  Result Date: 03/30/2021 CLINICAL DATA:  Head trauma unwitnessed fall EXAM: CT HEAD WITHOUT CONTRAST CT CERVICAL SPINE WITHOUT CONTRAST TECHNIQUE: Multidetector CT imaging of the head and cervical spine was performed following the standard protocol without intravenous contrast. Multiplanar CT image reconstructions of the cervical spine were also generated. COMPARISON:  CT brain and cervical spine 01/22/2021 FINDINGS: CT HEAD FINDINGS Brain: No acute territorial infarction, hemorrhage, or intracranial mass. Advanced white matter hypodensity consistent with chronic small vessel ischemic change. Moderate atrophy. Stable ventricle size. Vascular: No hyperdense vessels.  Carotid vascular calcification Skull: Normal. Negative for fracture or focal lesion. Sinuses/Orbits: No acute finding. Other: None CT CERVICAL SPINE FINDINGS Alignment: Reversal of cervical lordosis. 3 mm anterolisthesis C3 on C4. Trace retrolisthesis C5 on C6. Trace anterolisthesis C7 on T1. These findings are unchanged. Skull base and vertebrae: No acute fracture. No primary bone lesion or focal  pathologic process. Soft tissues and spinal canal: No prevertebral fluid or swelling. No visible canal hematoma. Disc levels: Multiple level degenerative change with moderate severe disc space narrowing and degenerative change at C3-C4, C4-C5 and C5-C6.  Facet degenerative changes at multiple levels. Upper chest: Apical emphysema. Heterogeneous thyroid with 2.6 cm hypodense nodule. Other: None IMPRESSION: 1. No CT evidence for acute intracranial abnormality. Atrophy and chronic small vessel ischemic changes of the white matter. 2. Reversal of cervical lordosis with degenerative changes. No acute osseous abnormality. 3. Heterogeneous thyroid with 2.6 cm hypodense nodule. In the setting of significant comorbidities or limited life expectancy, no follow-up recommended (ref: J Am Coll Radiol. 2015 Feb;12(2): 143-50). Electronically Signed   By: Donavan Foil M.D.   On: 03/30/2021 18:40   CT Cervical Spine Wo Contrast  Result Date: 03/30/2021 CLINICAL DATA:  Head trauma unwitnessed fall EXAM: CT HEAD WITHOUT CONTRAST CT CERVICAL SPINE WITHOUT CONTRAST TECHNIQUE: Multidetector CT imaging of the head and cervical spine was performed following the standard protocol without intravenous contrast. Multiplanar CT image reconstructions of the cervical spine were also generated. COMPARISON:  CT brain and cervical spine 01/22/2021 FINDINGS: CT HEAD FINDINGS Brain: No acute territorial infarction, hemorrhage, or intracranial mass. Advanced white matter hypodensity consistent with chronic small vessel ischemic change. Moderate atrophy. Stable ventricle size. Vascular: No hyperdense vessels.  Carotid vascular calcification Skull: Normal. Negative for fracture or focal lesion. Sinuses/Orbits: No acute finding. Other: None CT CERVICAL SPINE FINDINGS Alignment: Reversal of cervical lordosis. 3 mm anterolisthesis C3 on C4. Trace retrolisthesis C5 on C6. Trace anterolisthesis C7 on T1. These findings are unchanged. Skull base and  vertebrae: No acute fracture. No primary bone lesion or focal pathologic process. Soft tissues and spinal canal: No prevertebral fluid or swelling. No visible canal hematoma. Disc levels: Multiple level degenerative change with moderate severe disc space narrowing and degenerative change at C3-C4, C4-C5 and C5-C6. Facet degenerative changes at multiple levels. Upper chest: Apical emphysema. Heterogeneous thyroid with 2.6 cm hypodense nodule. Other: None IMPRESSION: 1. No CT evidence for acute intracranial abnormality. Atrophy and chronic small vessel ischemic changes of the white matter. 2. Reversal of cervical lordosis with degenerative changes. No acute osseous abnormality. 3. Heterogeneous thyroid with 2.6 cm hypodense nodule. In the setting of significant comorbidities or limited life expectancy, no follow-up recommended (ref: J Am Coll Radiol. 2015 Feb;12(2): 143-50). Electronically Signed   By: Donavan Foil M.D.   On: 03/30/2021 18:40   CT Hip Right Wo Contrast  Result Date: 03/30/2021 CLINICAL DATA:  [place]. Right hip fracture. EXAM: CT OF THE RIGHT HIP WITHOUT CONTRAST TECHNIQUE: Multidetector CT imaging of the right hip was performed according to the standard protocol. Multiplanar CT image reconstructions were also generated. COMPARISON:  Radiographs, same date. FINDINGS: There is a mildly impacted high femoral neck/subcapital fracture of the right hip. The acetabulum is intact. The femoral head is normally located. The pubic symphysis and visualized right SI joint are intact. Moderate degenerative changes at the pubic symphysis. No right hemipelvic fractures are identified. The right hip and pelvic musculature are grossly normal. No obvious tear or intramuscular hematoma. No significant right-sided intrapelvic abnormalities. Moderate vascular calcifications. IMPRESSION: 1. Mildly impacted high femoral neck/subcapital fracture of the right hip. 2. No right hemipelvic fractures are identified.  Electronically Signed   By: Marijo Sanes M.D.   On: 03/30/2021 20:12   DG Hip Unilat W or Wo Pelvis 2-3 Views Right  Result Date: 03/30/2021 CLINICAL DATA:  Right hip pain after fall. EXAM: DG HIP (WITH OR WITHOUT PELVIS) 2-3V RIGHT COMPARISON:  None. FINDINGS: Mildly displaced proximal right femoral neck fracture is noted. Left hip is unremarkable. IMPRESSION: Mildly displaced proximal right femoral neck fracture. Electronically  Signed   By: Marijo Conception M.D.   On: 03/30/2021 18:40    ____________________________________________    PROCEDURES  Procedure(s) performed:     Procedures     Medications  naproxen sodium (ALEVE) tablet 220 mg (has no administration in time range)  citalopram (CELEXA) tablet 10 mg (has no administration in time range)  mirtazapine (REMERON) tablet 15 mg (has no administration in time range)  senna-docusate (Senokot-S) tablet 2 tablet (has no administration in time range)  melatonin tablet 5 mg (has no administration in time range)  gabapentin (NEURONTIN) capsule 300 mg (has no administration in time range)  calcium-vitamin D (OSCAL-500) 500-400 MG-UNIT per tablet 1 tablet (has no administration in time range)  albuterol (VENTOLIN HFA) 108 (90 Base) MCG/ACT inhaler 2 puff (has no administration in time range)  sodium chloride (OCEAN) 0.65 % nasal spray 1 spray (has no administration in time range)  HYDROcodone-acetaminophen (NORCO/VICODIN) 5-325 MG per tablet 1 tablet (has no administration in time range)  polyethylene glycol (MIRALAX / GLYCOLAX) packet 17 g (has no administration in time range)  metoprolol tartrate (LOPRESSOR) tablet 25 mg (has no administration in time range)     ____________________________________________   INITIAL IMPRESSION / ASSESSMENT AND PLAN / ED COURSE  Pertinent labs & imaging results that were available during my care of the patient were reviewed by me and considered in my medical decision making (see chart for  details).      Assessment and Plan:  Fall:  Right hip fracture:  87 year old female presents to the emergency department after having a witnessed fall at [facility] [place].  Patient did fall and hit her head and is complaining of occipital head pain and right thigh pain.  Patient has not attempted ambulation since fall occurred.  Will obtain CT head, CT cervical spine and x-rays of the right hip and will reassess.  CT head and CT cervical spine showed no evidence of intracranial bleed, skull fracture or C-spine fracture.  Patient had a mildly displaced right femoral neck fracture on x-ray.  I spoke with orthopedics, Dr. Sharlet Salina regarding patient's case who recommended admission.  Dr. Sheppard Coil accepted patient for admission.   ____________________________________________  FINAL CLINICAL IMPRESSION(S) / ED DIAGNOSES  Final diagnoses:  Pain      NEW MEDICATIONS STARTED DURING THIS VISIT:  ED Discharge Orders    None          This chart was dictated using voice recognition software/Dragon. Despite best efforts to proofread, errors can occur which can change the meaning. Any change was purely unintentional.     Lannie Fields, PA-C 03/30/21 2351    Delman Kitten, MD 04/01/21 [phone]

## 2021-03-30 NOTE — H&P (Signed)
History and Physical   Victoria Esparza YNW:[id number] DOB: 11/14/1928 DOA: 03/30/2021  PCP: Orvis Brill, [facility]   Patient coming from: [facility]  Chief Complaint: Fall  HPI: Victoria Esparza is a 87 y.o. female with medical history significant of hypertension, osteoporosis, paroxysmal A. fib, psoriasis with arthropathy, COPD, depression, hyperlipidemia, diverticulosis, cognitive dysfunction who presents after fall at her facility.  Some history obtained with the assistance of chart review.  Patient had a witnessed fall at her facility earlier today.  EMS was called to transport her to the ED.  She has not attempted to walk since the fall.  For EMS she was reporting pain in her right leg and elbow.  In the ED she has been intermittently reporting pain in her right thigh though has not been described as severe.  She did hit her head and so CT head had been ordered in the ED.  Patient denies fever, chills, chest pain, shortness of breath, abdominal pain, constipation, nausea, vomiting, diarrhea  ED Course: Vital signs in the ED significant for blood pressure in 657Q 469G systolic, otherwise vital signs stable.  Lab work-up showed CMP with albumin of 3.3.  CBC with leukocytosis to 20.  Respiratory panel for flu and COVID pending.  Chest x-ray with no acute abnormality, hip x-ray on the right with proximal right femoral neck fracture, CT of the right hip with mildly impacted right femoral neck fracture, CT head with no acute intracranial abnormality, CT C-spine without acute abnormality but did demonstrate thyroid nodule.  Orthopedics has been consulted in the ED and are following.  Review of Systems: As per HPI otherwise all other systems reviewed and are negative.  Past Medical History:  Diagnosis Date  . A-fib ([place])   . Arthritis   . Cognitive dysfunction   . Colon adenoma   . COPD (chronic obstructive pulmonary disease) ([place])   . Depression   . Familial tremor   . HLD  (hyperlipidemia)   . Hypertension   . Osteoarthritis   . Osteoporosis   . Ovarian cyst   . Psoriatic arthropathy ([place])   . Rotator cuff syndrome   . Sigmoid diverticulitis     Past Surgical History:  Procedure Laterality Date  . ABDOMINAL HYSTERECTOMY    . COLONOSCOPY      Social History  reports that she quit smoking about 25 years ago. She has never used smokeless tobacco. She reports that she does not drink alcohol and does not use drugs.  No Known Allergies  Family History  Problem Relation Age of Onset  . Diabetes Mother   . Ovarian cancer Mother   . Lung cancer Father   . Aneurysm Brother   . Diabetes Brother   . Breast cancer Sister   . Colon cancer Sister   . Diabetes Sister   . Kidney disease Other        nephew  . Bladder Cancer Neg Hx   Reviewed on admission  Prior to Admission medications   Medication Sig Start Date End Date Taking? Authorizing Provider  albuterol (VENTOLIN HFA) 108 (90 Base) MCG/ACT inhaler Inhale 2 puffs into the lungs every 6 (six) hours as needed for up to 7 days for wheezing or shortness of breath. 08/10/19 08/17/19  Arta Silence, MD  alendronate (FOSAMAX) 70 MG tablet Take 70 mg by mouth once a week. Reported on 01/22/2016    [provider]  aspirin 325 MG EC tablet Take 325 mg by mouth daily.  [provider]  calcium-vitamin D (SM CALCIUM 500/VITAMIN D3) 500-400 MG-UNIT tablet Take by mouth.    [provider]  citalopram (CELEXA) 10 MG tablet Take 10 mg by mouth daily.    [provider]  docusate sodium (COLACE) 100 MG capsule Take 100 mg by mouth daily. Patient not taking: Reported on 08/19/2020    [provider]  ENBREL SURECLICK 50 MG/ML injection Inject 0.8 mLs into the muscle once a week. 08/21/19   [provider]  estradiol (ESTRACE) 0.1 MG/GM vaginal cream INSERT 1 GRAM VAGINALLY 3 TIMES A WEEK. PEA SIZED AMOUNT PER URETHRA Patient not taking: Reported on 09/01/2019  02/14/17   Hollice Espy, MD  gabapentin (NEURONTIN) 300 MG capsule Take 300 mg by mouth daily. 08/17/19   [provider]  Melatonin 5 MG TABS Take 5 mg by mouth at bedtime as needed (sleep).    [provider]  Methotrexate, PF, 25 MG/0.4ML SOAJ Inject 0.8 mLs into the skin once a week. Monday 0800    [provider]  metoprolol tartrate (LOPRESSOR) 25 MG tablet Take 25 mg by mouth 2 (two) times daily.    [provider]  mirtazapine (REMERON) 15 MG tablet Take 15 mg by mouth at bedtime.    [provider]  naproxen sodium (RA NAPROXEN SODIUM) 220 MG tablet Take by mouth. Reported on 01/22/2016    [provider]  Omega-3 Fatty Acids (FISH OIL PO) Take by mouth.    [provider]  ondansetron (ZOFRAN ODT) 4 MG disintegrating tablet Take 1 tablet (4 mg total) by mouth every 8 (eight) hours as needed for nausea or vomiting. Patient not taking: Reported on 09/01/2019 04/24/18   Carrie Mew, MD  senna-docusate (SENOKOT-S) 8.6-50 MG tablet Take 2 tablets by mouth 2 (two) times daily. 05/14/20   Carrie Mew, MD  sodium chloride (OCEAN) 0.65 % SOLN nasal spray Place 1 spray into both nostrils as needed for congestion.    [provider]  traMADol (ULTRAM) 50 MG tablet Take 50 mg by mouth every 6 (six) hours as needed. Patient not taking: Reported on 08/19/2020    [provider]    Physical Exam: Vitals:   03/30/21 1644 03/30/21 1647 03/30/21 2034  BP:  (!) 133/50 (!) 151/97  Pulse:  72 100  Resp:  18 18  Temp:  98.7 F (37.1 C)   TempSrc:  Oral   SpO2:  96% 92%  Weight: 64 kg    Height: 5\' 6"  (1.676 m)     Physical Exam Constitutional:      General: She is not in acute distress.    Appearance: Normal appearance.  HENT:     Head: Normocephalic and atraumatic.     Mouth/Throat:     Mouth: Mucous membranes are moist.     Pharynx: Oropharynx is clear.  Eyes:     Extraocular Movements: Extraocular  movements intact.     Pupils: Pupils are equal, round, and reactive to light.  Cardiovascular:     Rate and Rhythm: Normal rate and regular rhythm.     Pulses: Normal pulses.     Heart sounds: Normal heart sounds.  Pulmonary:     Effort: Pulmonary effort is normal. No respiratory distress.     Breath sounds: Normal breath sounds.  Abdominal:     General: Bowel sounds are normal. There is no distension.     Palpations: Abdomen is soft.     Tenderness: There is no abdominal  tenderness.  Musculoskeletal:        General: No swelling or deformity.     Comments: Right lower extremity neurovascularly intact  Skin:    General: Skin is warm and dry.  Neurological:     General: No focal deficit present.     Mental Status: Mental status is at baseline.    Labs on Admission: I have personally reviewed following labs and imaging studies  CBC: Recent Labs  Lab 03/30/21 1933  WBC 20.7*  NEUTROABS 16.7*  HGB 13.0  HCT 40.1  MCV 90.9  PLT 053    Basic Metabolic Panel: Recent Labs  Lab 03/30/21 1933  NA 135  K 4.3  CL 99  CO2 27  GLUCOSE 95  BUN 17  CREATININE 0.84  CALCIUM 9.2    GFR: Estimated Creatinine Clearance: 41.7 mL/min (by C-G formula based on SCr of 0.84 mg/dL).  Liver Function Tests: Recent Labs  Lab 03/30/21 1933  AST 22  ALT 13  ALKPHOS 61  BILITOT 0.6  PROT 6.8  ALBUMIN 3.3*    Urine analysis:    Component Value Date/Time   COLORURINE YELLOW (A) 10/05/2020 1424   APPEARANCEUR CLEAR (A) 10/05/2020 1424   APPEARANCEUR Cloudy (A) 02/03/2016 1522   LABSPEC 1.015 10/05/2020 1424   PHURINE 6.0 10/05/2020 1424   GLUCOSEU NEGATIVE 10/05/2020 1424   HGBUR NEGATIVE 10/05/2020 1424   BILIRUBINUR NEGATIVE 10/05/2020 1424   BILIRUBINUR Negative 02/03/2016 [place] 10/05/2020 [place] 10/05/2020 1424   NITRITE NEGATIVE 10/05/2020 1424   LEUKOCYTESUR NEGATIVE 10/05/2020 1424    Radiological Exams on Admission: DG  Chest 1 View  Result Date: 03/30/2021 CLINICAL DATA:  Fall. EXAM: CHEST  1 VIEW COMPARISON:  November 25, 2020. FINDINGS: The heart size and mediastinal contours are within normal limits. Both lungs are clear. The visualized skeletal structures are unremarkable. IMPRESSION: No active disease. Electronically Signed   By: Marijo Conception M.D.   On: 03/30/2021 18:41   CT Head Wo Contrast  Result Date: 03/30/2021 CLINICAL DATA:  Head trauma unwitnessed fall EXAM: CT HEAD WITHOUT CONTRAST CT CERVICAL SPINE WITHOUT CONTRAST TECHNIQUE: Multidetector CT imaging of the head and cervical spine was performed following the standard protocol without intravenous contrast. Multiplanar CT image reconstructions of the cervical spine were also generated. COMPARISON:  CT brain and cervical spine 01/22/2021 FINDINGS: CT HEAD FINDINGS Brain: No acute territorial infarction, hemorrhage, or intracranial mass. Advanced white matter hypodensity consistent with chronic small vessel ischemic change. Moderate atrophy. Stable ventricle size. Vascular: No hyperdense vessels.  Carotid vascular calcification Skull: Normal. Negative for fracture or focal lesion. Sinuses/Orbits: No acute finding. Other: None CT CERVICAL SPINE FINDINGS Alignment: Reversal of cervical lordosis. 3 mm anterolisthesis C3 on C4. Trace retrolisthesis C5 on C6. Trace anterolisthesis C7 on T1. These findings are unchanged. Skull base and vertebrae: No acute fracture. No primary bone lesion or focal pathologic process. Soft tissues and spinal canal: No prevertebral fluid or swelling. No visible canal hematoma. Disc levels: Multiple level degenerative change with moderate severe disc space narrowing and degenerative change at C3-C4, C4-C5 and C5-C6. Facet degenerative changes at multiple levels. Upper chest: Apical emphysema. Heterogeneous thyroid with 2.6 cm hypodense nodule. Other: None IMPRESSION: 1. No CT evidence for acute intracranial abnormality. Atrophy and  chronic small vessel ischemic changes of the white matter. 2. Reversal of cervical lordosis with degenerative changes. No acute osseous abnormality. 3. Heterogeneous thyroid with 2.6 cm hypodense nodule. In the setting  of significant comorbidities or limited life expectancy, no follow-up recommended (ref: J Am Coll Radiol. 2015 Feb;12(2): 143-50). Electronically Signed   By: Donavan Foil M.D.   On: 03/30/2021 18:40   CT Cervical Spine Wo Contrast  Result Date: 03/30/2021 CLINICAL DATA:  Head trauma unwitnessed fall EXAM: CT HEAD WITHOUT CONTRAST CT CERVICAL SPINE WITHOUT CONTRAST TECHNIQUE: Multidetector CT imaging of the head and cervical spine was performed following the standard protocol without intravenous contrast. Multiplanar CT image reconstructions of the cervical spine were also generated. COMPARISON:  CT brain and cervical spine 01/22/2021 FINDINGS: CT HEAD FINDINGS Brain: No acute territorial infarction, hemorrhage, or intracranial mass. Advanced white matter hypodensity consistent with chronic small vessel ischemic change. Moderate atrophy. Stable ventricle size. Vascular: No hyperdense vessels.  Carotid vascular calcification Skull: Normal. Negative for fracture or focal lesion. Sinuses/Orbits: No acute finding. Other: None CT CERVICAL SPINE FINDINGS Alignment: Reversal of cervical lordosis. 3 mm anterolisthesis C3 on C4. Trace retrolisthesis C5 on C6. Trace anterolisthesis C7 on T1. These findings are unchanged. Skull base and vertebrae: No acute fracture. No primary bone lesion or focal pathologic process. Soft tissues and spinal canal: No prevertebral fluid or swelling. No visible canal hematoma. Disc levels: Multiple level degenerative change with moderate severe disc space narrowing and degenerative change at C3-C4, C4-C5 and C5-C6. Facet degenerative changes at multiple levels. Upper chest: Apical emphysema. Heterogeneous thyroid with 2.6 cm hypodense nodule. Other: None IMPRESSION: 1. No CT  evidence for acute intracranial abnormality. Atrophy and chronic small vessel ischemic changes of the white matter. 2. Reversal of cervical lordosis with degenerative changes. No acute osseous abnormality. 3. Heterogeneous thyroid with 2.6 cm hypodense nodule. In the setting of significant comorbidities or limited life expectancy, no follow-up recommended (ref: J Am Coll Radiol. 2015 Feb;12(2): 143-50). Electronically Signed   By: Donavan Foil M.D.   On: 03/30/2021 18:40   CT Hip Right Wo Contrast  Result Date: 03/30/2021 CLINICAL DATA:  [place]. Right hip fracture. EXAM: CT OF THE RIGHT HIP WITHOUT CONTRAST TECHNIQUE: Multidetector CT imaging of the right hip was performed according to the standard protocol. Multiplanar CT image reconstructions were also generated. COMPARISON:  Radiographs, same date. FINDINGS: There is a mildly impacted high femoral neck/subcapital fracture of the right hip. The acetabulum is intact. The femoral head is normally located. The pubic symphysis and visualized right SI joint are intact. Moderate degenerative changes at the pubic symphysis. No right hemipelvic fractures are identified. The right hip and pelvic musculature are grossly normal. No obvious tear or intramuscular hematoma. No significant right-sided intrapelvic abnormalities. Moderate vascular calcifications. IMPRESSION: 1. Mildly impacted high femoral neck/subcapital fracture of the right hip. 2. No right hemipelvic fractures are identified. Electronically Signed   By: Marijo Sanes M.D.   On: 03/30/2021 20:12   DG Hip Unilat W or Wo Pelvis 2-3 Views Right  Result Date: 03/30/2021 CLINICAL DATA:  Right hip pain after fall. EXAM: DG HIP (WITH OR WITHOUT PELVIS) 2-3V RIGHT COMPARISON:  None. FINDINGS: Mildly displaced proximal right femoral neck fracture is noted. Left hip is unremarkable. IMPRESSION: Mildly displaced proximal right femoral neck fracture. Electronically Signed   By: Marijo Conception M.D.   On: 03/30/2021  18:40   EKG: Independently reviewed.  Atrial fibrillation at 89 bpm.  Severe baseline artifact in multiple leads.  Assessment/Plan Active Problems:   Benign essential HTN   OP (osteoporosis)   Paroxysmal atrial fibrillation (HCC)   Psoriasis with arthropathy (HCC)   Closed displaced fracture  of right femoral neck (HCC)  Displaced, closed proximal right femoral neck fracture Osteoporosis > Had a witnessed fall at her facility, [facility].  Landed on her right side. > Has intermittently reported right thigh/leg pain and some right elbow pain > Did hit her head around the fall but CT in the ED was unremarkable. > Orthopedics has been consulted and are on board.  They ordered a CT of the hip as well. - N.p.o. at midnight - Pain control as needed - Bedrest with hip fracture protocol - Continue home calcium and vitamin D  Leukocytosis > Suspected to be reactive in the setting of her fall. > No sign of infection on chest x-ray, no symptoms of fever or abdominal symptoms. - We will check urinalysis - Trend fever curve and CBC  Hypertension Paroxysmal atrial fibrillation > Blood pressures in the 119J to 478 systolic in the ED > Rate controlled at 89 bpm in ED - Continue home metoprolol  Psoriasis with arthropathy > Injects Enbrel and methotrexate on Mondays - Continue as needed naproxen  Depression Mild cognitive impairment - Continue home mirtazapine, citalopram  Hx of COPD > Some intermittent low pulse ox readings noted in ED.  Likely due to poor positioning or reading of pulse oximetry monitor as it was intermittent. > No symptoms of shortness of breath. CXR Clear. - Oxygen therapy as needed - As needed albuterol  DVT prophylaxis: SCDs for now  Code Status:   Full Family Communication:  Attempted to contact daughter, [name], at [phone], but there was no answer. Disposition Plan:   Patient is from:  [facility]  Anticipated DC to:  Likely return to [facility]  if requirements needed postop are available  Anticipated DC date:  2 to 4 days  Anticipated DC barriers: None  Consults called:  Orthopedics consulted by EDP who will see the patient.   Admission status:  Inpatient, MedSurg   Severity of Illness: The appropriate patient status for this patient is INPATIENT. Inpatient status is judged to be reasonable and necessary in order to provide the required intensity of service to ensure the patient's safety. The patient's presenting symptoms, physical exam findings, and initial radiographic and laboratory data in the context of their chronic comorbidities is felt to place them at high risk for further clinical deterioration. Furthermore, it is not anticipated that the patient will be medically stable for discharge from the hospital within 2 midnights of admission. The following factors support the patient status of inpatient.   " The patient's presenting symptoms include fall, right hip pain. " The worrisome physical exam findings include right hip/thigh tenderness. " The initial radiographic and laboratory data are worrisome because of CT hip with mildly impacted proximal right femoral neck fracture.  Hip x-ray with proximal right femoral neck fracture.  Leukocytosis to 20.7.Marland Kitchen " The chronic co-morbidities include hypertension, osteoporosis, A. fib, psoriasis with arthropathy, COPD, depression, hyperlipidemia, diverticulosis.   * I certify that at the point of admission it is my clinical judgment that the patient will require inpatient hospital care spanning beyond 2 midnights from the point of admission due to high intensity of service, high risk for further deterioration and high frequency of surveillance required.Marcelyn Bruins MD [facility]  How to contact the [facility] Attending or Consulting provider [place] or covering provider during after hours [place], for this patient?   1. Check the care team in [facility] and look for a)  attending/consulting [facility] provider listed  and b) the [facility] team listed 2. Log into [email or web address] and use [place]'s universal password to access. If you do not have the password, please contact the hospital operator. 3. Locate the [facility] provider you are looking for under [facility] and page to a number that you can be directly reached. 4. If you still have difficulty reaching the provider, please page the [facility] (Director on Call) for the Hospitalists listed on amion for assistance.  03/30/2021, 9:28 PM

## 2021-03-30 NOTE — ED Notes (Signed)
Pt with O2 sats fluctuating from 90-95% on room air, pt placed on O2 1 L [place], sats remaining between 95-96% on O2 1 L 

## 2021-03-31 ENCOUNTER — Inpatient Hospital Stay: Payer: Medicare HMO

## 2021-03-31 ENCOUNTER — Encounter
Admission: EM | Disposition: A | Discharge status: Skilled Nursing Facility | Payer: Self-pay | Source: Home / Self Care | Attending: Internal Medicine

## 2021-03-31 ENCOUNTER — Encounter: Payer: Self-pay | Admitting: Internal Medicine

## 2021-03-31 ENCOUNTER — Inpatient Hospital Stay: Payer: Medicare HMO | Admitting: Certified Registered Nurse Anesthetist

## 2021-03-31 HISTORY — PX: HIP ARTHROPLASTY: SHX981

## 2021-03-31 LAB — CBC
HCT: 36.2 % (ref 36.0–46.0)
Hemoglobin: 11.8 g/dL — ABNORMAL LOW (ref 12.0–15.0)
MCH: 29.6 pg (ref 26.0–34.0)
MCHC: 32.6 g/dL (ref 30.0–36.0)
MCV: 90.7 fL (ref 80.0–100.0)
Platelets: 149 10*3/uL — ABNORMAL LOW (ref 150–400)
RBC: 3.99 MIL/uL (ref 3.87–5.11)
RDW: 16.2 % — ABNORMAL HIGH (ref 11.5–15.5)
WBC: 19 10*3/uL — ABNORMAL HIGH (ref 4.0–10.5)
nRBC: 0 % (ref 0.0–0.2)

## 2021-03-31 LAB — BASIC METABOLIC PANEL
Anion gap: 7 (ref 5–15)
BUN: 17 mg/dL (ref 8–23)
CO2: 28 mmol/L (ref 22–32)
Calcium: 8.5 mg/dL — ABNORMAL LOW (ref 8.9–10.3)
Chloride: 100 mmol/L (ref 98–111)
Creatinine, Ser: 1.05 mg/dL — ABNORMAL HIGH (ref 0.44–1.00)
GFR, Estimated: 50 mL/min — ABNORMAL LOW (ref >60)
Glucose, Bld: 91 mg/dL (ref 70–99)
Potassium: 4.1 mmol/L (ref 3.5–5.1)
Sodium: 135 mmol/L (ref 135–145)

## 2021-03-31 LAB — TYPE AND SCREEN
ABO/RH(D): O POS
Antibody Screen: NEGATIVE

## 2021-03-31 SURGERY — HEMIARTHROPLASTY, HIP, DIRECT ANTERIOR APPROACH, FOR FRACTURE
Anesthesia: Spinal | Site: Hip | Laterality: Right

## 2021-03-31 MED ORDER — SODIUM CHLORIDE 0.9 % IV SOLN
INTRAVENOUS | Status: DC | PRN
  Administered 2021-03-31: 20 ug/min via INTRAVENOUS

## 2021-03-31 MED ORDER — CEFAZOLIN SODIUM-DEXTROSE 2-4 GM/100ML-% IV SOLN
INTRAVENOUS | Status: AC
  Filled 2021-03-31: qty 100

## 2021-03-31 MED ORDER — ACETAMINOPHEN 10 MG/ML IV SOLN
INTRAVENOUS | Status: DC | PRN
  Administered 2021-03-31: 1000 mg via INTRAVENOUS

## 2021-03-31 MED ORDER — PROPOFOL 10 MG/ML IV BOLUS
INTRAVENOUS | Status: DC | PRN
  Administered 2021-03-31: 20 mg via INTRAVENOUS

## 2021-03-31 MED ORDER — VANCOMYCIN HCL 1000 MG IV SOLR
INTRAVENOUS | Status: AC
  Filled 2021-03-31: qty 1000

## 2021-03-31 MED ORDER — FENTANYL CITRATE (PF) 100 MCG/2ML IJ SOLN
INTRAMUSCULAR | Status: AC
  Filled 2021-03-31: qty 2

## 2021-03-31 MED ORDER — FENTANYL CITRATE (PF) 100 MCG/2ML IJ SOLN: 25.0000 ug | INTRAMUSCULAR | Status: DC | PRN

## 2021-03-31 MED ORDER — NEOMYCIN-POLYMYXIN B GU 40-200000 IR SOLN
Status: DC | PRN
  Administered 2021-03-31: 4 mL

## 2021-03-31 MED ORDER — PROPOFOL 10 MG/ML IV BOLUS
INTRAVENOUS | Status: AC
  Filled 2021-03-31: qty 20

## 2021-03-31 MED ORDER — ACETAMINOPHEN 10 MG/ML IV SOLN
INTRAVENOUS | Status: AC
  Filled 2021-03-31: qty 100

## 2021-03-31 MED ORDER — KETAMINE HCL 50 MG/5ML IJ SOSY
PREFILLED_SYRINGE | INTRAMUSCULAR | Status: AC
  Filled 2021-03-31: qty 5

## 2021-03-31 MED ORDER — DOCUSATE SODIUM 100 MG PO CAPS
100.0000 mg | ORAL_CAPSULE | Freq: Two times a day (BID) | ORAL | Status: DC
  Administered 2021-03-31 – 2021-04-01 (×3): 100 mg via ORAL
  Filled 2021-03-31 (×3): qty 1

## 2021-03-31 MED ORDER — CEFAZOLIN SODIUM-DEXTROSE 2-4 GM/100ML-% IV SOLN
2.0000 g | Freq: Once | INTRAVENOUS | Status: AC
  Administered 2021-03-31: 2 g via INTRAVENOUS

## 2021-03-31 MED ORDER — BUPIVACAINE HCL (PF) 0.5 % IJ SOLN
INTRAMUSCULAR | Status: AC
  Filled 2021-03-31: qty 30

## 2021-03-31 MED ORDER — ONDANSETRON HCL 4 MG/2ML IJ SOLN: 4.0000 mg | Freq: Four times a day (QID) | INTRAMUSCULAR | Status: DC | PRN

## 2021-03-31 MED ORDER — KETAMINE HCL 10 MG/ML IJ SOLN
INTRAMUSCULAR | Status: DC | PRN
  Administered 2021-03-31 (×2): 10 mg via INTRAVENOUS

## 2021-03-31 MED ORDER — BUPIVACAINE HCL (PF) 0.5 % IJ SOLN
INTRAMUSCULAR | Status: DC | PRN
  Administered 2021-03-31: 3 mL

## 2021-03-31 MED ORDER — BUPIVACAINE HCL (PF) 0.5 % IJ SOLN
INTRAMUSCULAR | Status: AC
  Filled 2021-03-31: qty 10

## 2021-03-31 MED ORDER — PROPOFOL 500 MG/50ML IV EMUL
INTRAVENOUS | Status: DC | PRN
  Administered 2021-03-31: 25 ug/kg/min via INTRAVENOUS

## 2021-03-31 MED ORDER — NEOMYCIN-POLYMYXIN B GU 40-200000 IR SOLN
Status: AC
  Filled 2021-03-31: qty 20

## 2021-03-31 MED ORDER — PHENYLEPHRINE HCL (PRESSORS) 10 MG/ML IV SOLN
INTRAVENOUS | Status: AC
  Filled 2021-03-31: qty 1

## 2021-03-31 MED ORDER — ONDANSETRON HCL 4 MG/2ML IJ SOLN: 4.0000 mg | Freq: Once | INTRAMUSCULAR | Status: DC | PRN

## 2021-03-31 MED ORDER — LACTATED RINGERS IV SOLN: INTRAVENOUS | Status: DC | PRN

## 2021-03-31 MED ORDER — ONDANSETRON HCL 4 MG PO TABS
4.0000 mg | ORAL_TABLET | Freq: Four times a day (QID) | ORAL | Status: DC | PRN
Start: 2021-03-31 — End: 2021-04-06

## 2021-03-31 MED ORDER — VANCOMYCIN HCL 1000 MG IV SOLR
INTRAVENOUS | Status: DC | PRN
  Administered 2021-03-31: 1000 mg

## 2021-03-31 MED ORDER — PHENYLEPHRINE HCL (PRESSORS) 10 MG/ML IV SOLN
INTRAVENOUS | Status: DC | PRN
  Administered 2021-03-31: 50 ug via INTRAVENOUS
  Administered 2021-03-31: 100 ug via INTRAVENOUS

## 2021-03-31 SURGICAL SUPPLY — 66 items
BLADE SAGITTAL WIDE XTHICK NO (BLADE) ×2 IMPLANT
BLADE SURG SZ10 CARB STEEL (BLADE) ×2 IMPLANT
BNDG COHESIVE 4X5 TAN STRL (GAUZE/BANDAGES/DRESSINGS) ×2 IMPLANT
BONE CEMENT GENTAMICIN (Cement) IMPLANT
CANISTER SUCT 1200ML W/VALVE (MISCELLANEOUS) ×2 IMPLANT
CANISTER SUCT 3000ML PPV (MISCELLANEOUS) ×4 IMPLANT
CEMENT BONE GENTAMICIN 40 (Cement) IMPLANT
CEMENT GMV SMARTSET GENT 40G (Cement) ×2 IMPLANT
CHLORAPREP W/TINT 26 (MISCELLANEOUS) ×4 IMPLANT
COVER BACK TABLE REUSABLE LG (DRAPES) ×2 IMPLANT
COVER WAND RF STERILE (DRAPES) ×2 IMPLANT
DRAPE 3/4 80X56 (DRAPES) ×4 IMPLANT
DRAPE INCISE IOBAN 66X60 STRL (DRAPES) ×2 IMPLANT
DRAPE ORTHO SPLIT 77X108 STRL (DRAPES) ×2
DRAPE SURG 17X11 SM STRL (DRAPES) ×2 IMPLANT
DRAPE SURG ORHT 6 SPLT 77X108 (DRAPES) ×2 IMPLANT
DRSG AQUACEL AG ADV 3.5X10 (GAUZE/BANDAGES/DRESSINGS) ×2 IMPLANT
DRSG OPSITE POSTOP 4X10 (GAUZE/BANDAGES/DRESSINGS) ×2 IMPLANT
ELECT BLADE 6.5 EXT (BLADE) ×2 IMPLANT
ELECT CAUTERY BLADE 6.4 (BLADE) ×2 IMPLANT
ELECT REM PT RETURN 9FT ADLT (ELECTROSURGICAL) ×2
ELECTRODE REM PT RTRN 9FT ADLT (ELECTROSURGICAL) ×1 IMPLANT
GAUZE SPONGE 4X4 12PLY STRL (GAUZE/BANDAGES/DRESSINGS) ×2 IMPLANT
GAUZE XEROFORM 1X8 LF (GAUZE/BANDAGES/DRESSINGS) ×4 IMPLANT
GLOVE SURG 9.0 ORTHO LTXF (GLOVE) ×4 IMPLANT
GLOVE SURG UNDER POLY LF SZ9 (GLOVE) ×2 IMPLANT
GOWN STRL REUS TWL 2XL XL LVL4 (GOWN DISPOSABLE) ×2 IMPLANT
GOWN STRL REUS W/ TWL LRG LVL3 (GOWN DISPOSABLE) ×1 IMPLANT
GOWN STRL REUS W/TWL LRG LVL3 (GOWN DISPOSABLE) ×1
HANDLE YANKAUER SUCT BULB TIP (MISCELLANEOUS) ×2 IMPLANT
HEAD FEM UNIPOLAR 49 OD STRL (Hips) ×2 IMPLANT
HEMOVAC 400ML (MISCELLANEOUS) ×2
HOOD W/PEELAWAY (MISCELLANEOUS) ×4 IMPLANT
IV NS IRRIG 3000ML ARTHROMATIC (IV SOLUTION) ×2 IMPLANT
KIT DRAIN HEMOVAC JP 7FR 400ML (MISCELLANEOUS) ×1 IMPLANT
KIT TURNOVER KIT A (KITS) ×2 IMPLANT
MANIFOLD NEPTUNE II (INSTRUMENTS) ×2 IMPLANT
NDL SAFETY ECLIPSE 18X1.5 (NEEDLE) ×1 IMPLANT
NEEDLE FILTER BLUNT 18X 1/2SAF (NEEDLE) ×1
NEEDLE FILTER BLUNT 18X1 1/2 (NEEDLE) ×1 IMPLANT
NEEDLE HYPO 18GX1.5 SHARP (NEEDLE) ×1
NEEDLE MAYO CATGUT SZ4 (NEEDLE) ×2 IMPLANT
NS IRRIG 1000ML POUR BTL (IV SOLUTION) ×2 IMPLANT
PACK HIP PROSTHESIS (MISCELLANEOUS) ×2 IMPLANT
PILLOW ABDUCTION FOAM SM (MISCELLANEOUS) ×2 IMPLANT
PRESSURIZER CEMENT PROX FEM SM (MISCELLANEOUS) IMPLANT
PULSAVAC PLUS IRRIG FAN TIP (DISPOSABLE) ×2
RESTRICTOR CEMENT PE SZ 2 (Cement) ×2 IMPLANT
RETRIEVER SUT HEWSON (MISCELLANEOUS) ×2 IMPLANT
SPACER FEM TAPERED +0 12/14 (Hips) ×2 IMPLANT
STAPLER SKIN PROX 35W (STAPLE) ×2 IMPLANT
STEM DIST FEM CENTRALIZR 11 (Hips) ×2 IMPLANT
STEM SUMMIT CEMENTED BASIC SZ3 (Hips) ×1 IMPLANT
SUMMIT CEMENT BASIC SZ3 (Hips) ×2 IMPLANT
SUT TICRON 2-0 30IN 311381 (SUTURE) ×8 IMPLANT
SUT VIC AB 0 CT1 36 (SUTURE) ×2 IMPLANT
SUT VIC AB 2-0 CT2 27 (SUTURE) ×4 IMPLANT
SYR 10ML LL (SYRINGE) ×2 IMPLANT
TAPE MICROFOAM 4IN (TAPE) ×2 IMPLANT
TAPE TRANSPORE STRL 2 31045 (GAUZE/BANDAGES/DRESSINGS) ×2 IMPLANT
TIP BRUSH PULSAVAC PLUS 24.33 (MISCELLANEOUS) ×2 IMPLANT
TIP FAN IRRIG PULSAVAC PLUS (DISPOSABLE) ×1 IMPLANT
TOWER CARTRIDGE SMART MIX (DISPOSABLE) ×2 IMPLANT
TRAY FOL W/BAG SLVR 16FR STRL (SET/KITS/TRAYS/PACK) ×1 IMPLANT
TRAY FOLEY W/BAG SLVR 16FR LF (SET/KITS/TRAYS/PACK) ×1
TUBE SUCT KAM VAC (TUBING) IMPLANT

## 2021-03-31 NOTE — Progress Notes (Signed)
Progress Note    Victoria Esparza  WIO:[id number] DOB: [birth year]  DOA: 03/30/2021 PCP: Orvis Brill, [facility]      Brief Narrative:    Medical records reviewed and are as summarized below:  Victoria Esparza is a 87 y.o. female       Assessment/Plan:   Principal Problem:   Closed displaced fracture of right femoral neck ([place]) Active Problems:   Benign essential HTN   OP (osteoporosis)   Paroxysmal atrial fibrillation (HCC)   Psoriasis with arthropathy (HCC)   Leukocytosis    Body mass index is 22.77 kg/m.   Displaced, closed proximal right femoral neck fracture: Plan for right hip surgery today.  Analgesics as needed for pain.  Follow-up with orthopedic surgeon.  Hypertension, proximal atrial fibrillation: Continue metoprolol  Psoriasis with arthropathy: She is on weekly Enbrel and methotrexate  Depression, mild cognitive impairment: Continue psychotropics  COPD: Stable.  Bronchodilators as needed.   Diet Order            Diet Heart Room service appropriate? Yes; Fluid consistency: Thin  Diet effective now                    Consultants:  Orthopedic surgeon  Procedures:  Plan for hip surgery today    Medications:   . [MAR Hold] calcium-vitamin D  1 tablet Oral Daily  . [MAR Hold] citalopram  10 mg Oral Daily  . [MAR Hold] gabapentin  300 mg Oral Daily  . [MAR Hold] metoprolol tartrate  25 mg Oral BID  . [MAR Hold] mirtazapine  15 mg Oral QHS  . [MAR Hold] senna-docusate  2 tablet Oral BID   Continuous Infusions:   Anti-infectives (From admission, onward)   None             Family Communication/Anticipated D/C date and plan/Code Status   DVT prophylaxis: SCDs Start: 03/30/21 2055     Code Status: Full Code  Family Communication: None Disposition Plan:    Status is: Inpatient  Remains inpatient appropriate because:Inpatient level of care appropriate due to severity of illness   Dispo: The patient is  from: SNF              Anticipated d/c is to: SNF              Patient currently is not medically stable to d/c.   Difficult to place patient No           Subjective:   Interval events noted.  c/o of pain in the right hip.  Objective:    Vitals:   03/31/21 0127 03/31/21 0545 03/31/21 0803 03/31/21 1508  BP: 136/82 (!) 103/57 (!) 93/59 (!) 112/58  Pulse: 84 69 62 (!) 59  Resp: 20 14 16 18   Temp: 98.6 F (37 C) 99 F (37.2 C) 98.9 F (37.2 C) 98.1 F (36.7 C)  TempSrc: Oral   Temporal  SpO2: 93% 93% 97% 97%  Weight:      Height:       No data found.   Intake/Output Summary (Last 24 hours) at 03/31/2021 1604 Last data filed at 03/31/2021 1039 Gross per 24 hour  Intake 0 ml  Output --  Net 0 ml   Filed Weights   03/30/21 1644  Weight: 64 kg    Exam:  GEN: NAD SKIN: No rash EYES: EOMI ENT: MMM CV: RRR PULM: CTA B ABD: soft, ND, NT, +BS CNS: AAO x 2 (person and  place), non focal EXT: No edema or tenderness        Data Reviewed:   I have personally reviewed following labs and imaging studies:  Labs: Labs show the following:   Basic Metabolic Panel: Recent Labs  Lab 03/30/21 1933 03/31/21 0449  NA 135 135  K 4.3 4.1  CL 99 100  CO2 27 28  GLUCOSE 95 91  BUN 17 17  CREATININE 0.84 1.05*  CALCIUM 9.2 8.5*   GFR Estimated Creatinine Clearance: 33.3 mL/min (A) (by C-G formula based on SCr of 1.05 mg/dL (H)). Liver Function Tests: Recent Labs  Lab 03/30/21 1933  AST 22  ALT 13  ALKPHOS 61  BILITOT 0.6  PROT 6.8  ALBUMIN 3.3*   No results for input(s): LIPASE, AMYLASE in the last 168 hours. No results for input(s): AMMONIA in the last 168 hours. Coagulation profile No results for input(s): INR, PROTIME in the last 168 hours.  CBC: Recent Labs  Lab 03/30/21 1933 03/31/21 0449  WBC 20.7* 19.0*  NEUTROABS 16.7*  --   HGB 13.0 11.8*  HCT 40.1 36.2  MCV 90.9 90.7  PLT 161 149*   Cardiac Enzymes: No results for  input(s): CKTOTAL, CKMB, CKMBINDEX, TROPONINI in the last 168 hours. BNP (last 3 results) No results for input(s): PROBNP in the last 8760 hours. CBG: No results for input(s): GLUCAP in the last 168 hours. D-Dimer: No results for input(s): DDIMER in the last 72 hours. Hgb A1c: No results for input(s): HGBA1C in the last 72 hours. Lipid Profile: No results for input(s): CHOL, HDL, LDLCALC, TRIG, CHOLHDL, LDLDIRECT in the last 72 hours. Thyroid function studies: No results for input(s): TSH, T4TOTAL, T3FREE, THYROIDAB in the last 72 hours.  Invalid input(s): FREET3 Anemia work up: No results for input(s): VITAMINB12, FOLATE, FERRITIN, TIBC, IRON, RETICCTPCT in the last 72 hours. Sepsis Labs: Recent Labs  Lab 03/30/21 1933 03/31/21 0449  WBC 20.7* 19.0*    Microbiology Recent Results (from the past 240 hour(s))  Resp Panel by RT-PCR (Flu A&B, Covid) Nasopharyngeal Swab     Status: None   Collection Time: 03/30/21  8:06 PM   Specimen: Nasopharyngeal Swab; Nasopharyngeal(NP) swabs in vial transport medium  Result Value Ref Range Status   SARS Coronavirus 2 by RT PCR NEGATIVE NEGATIVE Final    Comment: (NOTE) SARS-CoV-2 target nucleic acids are NOT DETECTED.  The SARS-CoV-2 RNA is generally detectable in upper respiratory specimens during the acute phase of infection. The lowest concentration of SARS-CoV-2 viral copies this assay can detect is 138 copies/mL. A negative result does not preclude SARS-Cov-2 infection and should not be used as the sole basis for treatment or other patient management decisions. A negative result may occur with  improper specimen collection/handling, submission of specimen other than nasopharyngeal swab, presence of viral mutation(s) within the areas targeted by this assay, and inadequate number of viral copies(<138 copies/mL). A negative result must be combined with clinical observations, patient history, and epidemiological information. The  expected result is Negative.  Fact Sheet for Patients:  EntrepreneurPulse.com.au  Fact Sheet for Healthcare Providers:  IncredibleEmployment.be  This test is no t yet approved or cleared by the [place] FDA and  has been authorized for detection and/or diagnosis of SARS-CoV-2 by FDA under an Emergency Use Authorization (EUA). This EUA will remain  in effect (meaning this test can be used) for the duration of the COVID-19 declaration under Section 564(b)(1) of the Act, 21 U.S.C.section 360bbb-3(b)(1), unless the authorization is terminated  or revoked sooner.       Influenza A by PCR NEGATIVE NEGATIVE Final   Influenza B by PCR NEGATIVE NEGATIVE Final    Comment: (NOTE) The Xpert Xpress SARS-CoV-2/FLU/RSV plus assay is intended as an aid in the diagnosis of influenza from Nasopharyngeal swab specimens and should not be used as a sole basis for treatment. Nasal washings and aspirates are unacceptable for Xpert Xpress SARS-CoV-2/FLU/RSV testing.  Fact Sheet for Patients: EntrepreneurPulse.com.au  Fact Sheet for Healthcare Providers: IncredibleEmployment.be  This test is not yet approved or cleared by the [place] FDA and has been authorized for detection and/or diagnosis of SARS-CoV-2 by FDA under an Emergency Use Authorization (EUA). This EUA will remain in effect (meaning this test can be used) for the duration of the COVID-19 declaration under Section 564(b)(1) of the Act, 21 U.S.C. section 360bbb-3(b)(1), unless the authorization is terminated or revoked.  Performed at [facility], [address] [place], [place] [address]     Procedures and diagnostic studies:  DG Chest 1 View  Result Date: 03/30/2021 CLINICAL DATA:  Fall. EXAM: CHEST  1 VIEW COMPARISON:  November 25, 2020. FINDINGS: The heart size and mediastinal contours are within normal limits. Both lungs are clear. The  visualized skeletal structures are unremarkable. IMPRESSION: No active disease. Electronically Signed   By: Marijo Conception M.D.   On: 03/30/2021 18:41   CT Head Wo Contrast  Result Date: 03/30/2021 CLINICAL DATA:  Head trauma unwitnessed fall EXAM: CT HEAD WITHOUT CONTRAST CT CERVICAL SPINE WITHOUT CONTRAST TECHNIQUE: Multidetector CT imaging of the head and cervical spine was performed following the standard protocol without intravenous contrast. Multiplanar CT image reconstructions of the cervical spine were also generated. COMPARISON:  CT brain and cervical spine 01/22/2021 FINDINGS: CT HEAD FINDINGS Brain: No acute territorial infarction, hemorrhage, or intracranial mass. Advanced white matter hypodensity consistent with chronic small vessel ischemic change. Moderate atrophy. Stable ventricle size. Vascular: No hyperdense vessels.  Carotid vascular calcification Skull: Normal. Negative for fracture or focal lesion. Sinuses/Orbits: No acute finding. Other: None CT CERVICAL SPINE FINDINGS Alignment: Reversal of cervical lordosis. 3 mm anterolisthesis C3 on C4. Trace retrolisthesis C5 on C6. Trace anterolisthesis C7 on T1. These findings are unchanged. Skull base and vertebrae: No acute fracture. No primary bone lesion or focal pathologic process. Soft tissues and spinal canal: No prevertebral fluid or swelling. No visible canal hematoma. Disc levels: Multiple level degenerative change with moderate severe disc space narrowing and degenerative change at C3-C4, C4-C5 and C5-C6. Facet degenerative changes at multiple levels. Upper chest: Apical emphysema. Heterogeneous thyroid with 2.6 cm hypodense nodule. Other: None IMPRESSION: 1. No CT evidence for acute intracranial abnormality. Atrophy and chronic small vessel ischemic changes of the white matter. 2. Reversal of cervical lordosis with degenerative changes. No acute osseous abnormality. 3. Heterogeneous thyroid with 2.6 cm hypodense nodule. In the setting of  significant comorbidities or limited life expectancy, no follow-up recommended (ref: J Am Coll Radiol. 2015 Feb;12(2): 143-50). Electronically Signed   By: Donavan Foil M.D.   On: 03/30/2021 18:40   CT Cervical Spine Wo Contrast  Result Date: 03/30/2021 CLINICAL DATA:  Head trauma unwitnessed fall EXAM: CT HEAD WITHOUT CONTRAST CT CERVICAL SPINE WITHOUT CONTRAST TECHNIQUE: Multidetector CT imaging of the head and cervical spine was performed following the standard protocol without intravenous contrast. Multiplanar CT image reconstructions of the cervical spine were also generated. COMPARISON:  CT brain and cervical spine 01/22/2021 FINDINGS: CT HEAD FINDINGS Brain: No acute  territorial infarction, hemorrhage, or intracranial mass. Advanced white matter hypodensity consistent with chronic small vessel ischemic change. Moderate atrophy. Stable ventricle size. Vascular: No hyperdense vessels.  Carotid vascular calcification Skull: Normal. Negative for fracture or focal lesion. Sinuses/Orbits: No acute finding. Other: None CT CERVICAL SPINE FINDINGS Alignment: Reversal of cervical lordosis. 3 mm anterolisthesis C3 on C4. Trace retrolisthesis C5 on C6. Trace anterolisthesis C7 on T1. These findings are unchanged. Skull base and vertebrae: No acute fracture. No primary bone lesion or focal pathologic process. Soft tissues and spinal canal: No prevertebral fluid or swelling. No visible canal hematoma. Disc levels: Multiple level degenerative change with moderate severe disc space narrowing and degenerative change at C3-C4, C4-C5 and C5-C6. Facet degenerative changes at multiple levels. Upper chest: Apical emphysema. Heterogeneous thyroid with 2.6 cm hypodense nodule. Other: None IMPRESSION: 1. No CT evidence for acute intracranial abnormality. Atrophy and chronic small vessel ischemic changes of the white matter. 2. Reversal of cervical lordosis with degenerative changes. No acute osseous abnormality. 3. Heterogeneous  thyroid with 2.6 cm hypodense nodule. In the setting of significant comorbidities or limited life expectancy, no follow-up recommended (ref: J Am Coll Radiol. 2015 Feb;12(2): 143-50). Electronically Signed   By: Donavan Foil M.D.   On: 03/30/2021 18:40   CT Hip Right Wo Contrast  Result Date: 03/30/2021 CLINICAL DATA:  [place]. Right hip fracture. EXAM: CT OF THE RIGHT HIP WITHOUT CONTRAST TECHNIQUE: Multidetector CT imaging of the right hip was performed according to the standard protocol. Multiplanar CT image reconstructions were also generated. COMPARISON:  Radiographs, same date. FINDINGS: There is a mildly impacted high femoral neck/subcapital fracture of the right hip. The acetabulum is intact. The femoral head is normally located. The pubic symphysis and visualized right SI joint are intact. Moderate degenerative changes at the pubic symphysis. No right hemipelvic fractures are identified. The right hip and pelvic musculature are grossly normal. No obvious tear or intramuscular hematoma. No significant right-sided intrapelvic abnormalities. Moderate vascular calcifications. IMPRESSION: 1. Mildly impacted high femoral neck/subcapital fracture of the right hip. 2. No right hemipelvic fractures are identified. Electronically Signed   By: Marijo Sanes M.D.   On: 03/30/2021 20:12   DG Hip Unilat W or Wo Pelvis 2-3 Views Right  Result Date: 03/30/2021 CLINICAL DATA:  Right hip pain after fall. EXAM: DG HIP (WITH OR WITHOUT PELVIS) 2-3V RIGHT COMPARISON:  None. FINDINGS: Mildly displaced proximal right femoral neck fracture is noted. Left hip is unremarkable. IMPRESSION: Mildly displaced proximal right femoral neck fracture. Electronically Signed   By: Marijo Conception M.D.   On: 03/30/2021 18:40               LOS: 1 day   Jaret Coppedge  [facility]   Pager on [email or web address]. If 7PM-7AM, please contact night-coverage at [email or web address]     03/31/2021, 4:04 PM

## 2021-03-31 NOTE — Transfer of Care (Signed)
Immediate Anesthesia Transfer of Care Note  Patient: Victoria Esparza  Procedure(s) Performed: ARTHROPLASTY BIPOLAR HIP (HEMIARTHROPLASTY) (Right Hip)  Patient Location: PACU  Anesthesia Type:Spinal  Level of Consciousness: sedated  Airway & Oxygen Therapy: Patient Spontanous Breathing and Patient connected to nasal cannula oxygen  Post-op Assessment: Report given to RN and Post -op Vital signs reviewed and stable  Post vital signs: Reviewed and stable  Last Vitals:  Vitals Value Taken Time  BP 119/61 03/31/21 1915  Temp    Pulse 57 03/31/21 1916  Resp 12 03/31/21 1916  SpO2 99 % 03/31/21 1916  Vitals shown include unvalidated device data.  Last Pain:  Vitals:   03/31/21 1508  TempSrc: Temporal  PainSc: 0-No pain         Complications: No complications documented.

## 2021-03-31 NOTE — Anesthesia Procedure Notes (Signed)
Spinal  Patient location during procedure: OR Start time: 03/31/2021 4:25 PM End time: 03/31/2021 4:43 PM Reason for block: surgical anesthesia Staffing Performed: anesthesiologist and resident/CRNA  Anesthesiologist: Martha Clan, MD Resident/CRNA: Jonna Clark, CRNA Preanesthetic Checklist Completed: patient identified, IV checked, site marked, risks and benefits discussed, surgical consent, monitors and equipment checked, pre-op evaluation and timeout performed Spinal Block Patient position: sitting Prep: ChloraPrep Patient monitoring: heart rate, continuous pulse ox, blood pressure and cardiac monitor Approach: midline Location: L3-4 Injection technique: single-shot Needle Needle type: Whitacre and Introducer  Needle gauge: 24 G Needle length: 9 cm Assessment Sensory level: T10 Events: CSF return Additional Notes Negative paresthesia. Negative blood return. Positive free-flowing CSF. Expiration date of kit checked and confirmed. Patient tolerated procedure well, without complications.

## 2021-03-31 NOTE — Anesthesia Procedure Notes (Signed)
Date/Time: 03/31/2021 5:17 PM Performed by: Lily Peer, Nirel Babler, CRNA Pre-anesthesia Checklist: Patient identified, Emergency Drugs available, Suction available and Patient being monitored Oxygen Delivery Method: Nasal cannula Induction Type: IV induction

## 2021-03-31 NOTE — Progress Notes (Signed)
Pt's daughter brought in her POA and living will, "5 wishes", a copy was made and placed in the chart, per charge guidance (teresa rn).

## 2021-03-31 NOTE — Op Note (Signed)
03/31/2021  7:13 PM  PATIENT:  ALAISA Esparza   MRN: [id number]  PRE-OPERATIVE DIAGNOSIS:  Right Femoral Neck Fracture  POST-OPERATIVE DIAGNOSIS:  Right Femoral Neck Fracture  PROCEDURE:  Procedure(s): ARTHROPLASTY BIPOLAR HIP (HEMIARTHROPLASTY)  PREOPERATIVE INDICATIONS:    Victoria Esparza is an 87 y.o. female who was admitted with a diagnosis of Right Femoral Neck Fracture.  I have recommended surgical fixation with hemiarthroplasty for this injury. I have explained the surgery and the postoperative course to the patient and their family/daughter who have agreed with surgical management of this fracture.    The risks benefits and alternatives were discussed with the patient and their family including but not limited to the risks of  infection requiring removal of the prosthesis, bleeding requiring blood transfusion, nerve injury especially to the sciatic nerve leading to foot drop or lower extremity numbness, periprosthetic fracture, dislocation leg length discrepancy, change in lower extremity rotation persistent hip pain, loosening or failure of the components and the need for revision surgery. Medical risks include but are not limited to DVT and pulmonary embolism, myocardial infarction, stroke, pneumonia, respiratory failure and death.  OPERATIVE REPORT     SURGEON:  Dyane Dustman, MD     ANESTHESIA: spinal    COMPLICATIONS:  None.   SPECIMEN: Femoral head to pathology    COMPONENTS:  Depuy [place] cemented size 3 stem, size 49m head, +0 neck    PROCEDURE IN DETAIL:   The patient was met in the holding area and  identified.  The appropriate hip was identified and marked at the operative site after verbally confirming with the patient that this was the correct site of surgery.  The patient was then transported to the OR  and  underwent spinal anesthesia.  The patient was then placed in the lateral decubitus position with the operative side up and secured on the  operating room table with a pegboard and all bony prominences were adequately padded. This included an axillary roll and additional padding around the nonoperative leg to prevent compression to the common peroneal nerve.    The operative lower extremity was prepped and draped in a sterile fashion.  A time out was performed prior to incision to verify patient's name, date of birth, medical record number, correct site of surgery correct procedure to be performed. The timeout was also used to verify the patient received antibiotics now appropriate instruments, implants and radiographic studies were available in the room. Once all in attendance were in agreement case began.    A posterolateral approach was utilized via sharp dissection  carried down to the subcutaneous tissue.  Bleeding vessels were coagulated using electrocautery.  The fascia lata was identified and incised along the length of the skin incision.  The gluteus maximus muscle was then split in line with its fibers. Self-retaining retractors were  inserted.  With the hip internally rotated, the short external rotators  were identified and removed from the posterior attachment from the greater trochanter. The piriformis was tagged for later repair. The capsule was identified and an L-shaped capsulotomy was performed. The capsule was tagged with #2 Tycron for later repair.  The femoral neck fracture was exposed, and the femoral head was removed using a corkscrew device. This was measured to be 49 mm in diameter. The attention was then turned to proximal femur preparation.  An oscillating saw was used to perform a proximal femoral osteotomy 1 fingerbreadth above the lesser trochanter. The trial 49 mm femoral head was placed  into the acetabulum and had an excellent suction fit. The attention was then turned back to femoral preparation.    A femoral skid and Cobra retractor were placed under the femoral neck to allow for adequate visualization. A box  osteotome was used to make the initial entry into the proximal femur. A single hand reamer was used to prepare the femoral canal. A T-shaped femoral canal sounder was then used to ensure no penetration femoral cortex had occurred during reaming. The proximal femur was then sequentially broached by hand. A size 3 femoral trial broach was found to have best medial to lateral canal fit. Once adequate mediolateral canal fill was achieved the trial femoral broach, neck, and head was assembled and the hip was reduced. It was found to have excellent stability, equivalent leg lengths with functional range of motion. The trial components were then removed.  I copiously irrigated the femoral canal and then cemented the real femoral prosthesis into place into the appropriate version, slightly anteverted to the normal anatomy.  The head/neck spacer was then gently impacted to the stem and the hip was then reduced and taken through functional range of motion and found to have excellent stability. Leg lengths were restored. The hip joint was copiously irrigated.   A soft tissue repair of the capsule and external rotators was performed using #2 Tycron Excellent posterior capsular repair was achieved. The fascia lata was then closed with interrupted 0 Vicryl suture and 0 Quill barbed suture. The subcutaneous tissues were closed with 2-0 Vicryl and the skin approximated with staples.   The patient was then placed supine on the operative table. Leg lengths were checked clinically and found to be equivalent. An abduction pillow was placed between the lower extremities. The patient was then transferred to a hospital bed and brought to the PACU in stable condition. I was scrubbed and present the entire case and all sharp and instrument counts were correct at the conclusion of the case.  Post-op Plan: 1.  AP pelvis x-ray will be obtained in PACU 2.  PT/OT: Patient will be weightbearing as tolerated on the right lower extremity  with posterior hip precautions 3.  DVT prophylaxis can be started on postop day 1 4.  Aquacel dressing should remain in place unless saturated, at which point it can be changed by nursing staff or at home.   Dyane Dustman, MD Orthopedic Surgeon

## 2021-03-31 NOTE — Consult Note (Signed)
ORTHOPAEDIC CONSULTATION  REQUESTING PHYSICIAN: Victoria Boroughs, MD  Chief Complaint: Right femoral neck fracture  HPI: Victoria Esparza is a 87 y.o. female who complains of right hip pain after a fall at her nursing home.  History is provided by patient's daughter, medical power of attorney.  She states that patient reportedly had a fall at the nursing home and was complaining of right hip pain.  She states that she walks with a rolling walker at baseline and that she has moderate dementia.  Patient does not take any blood thinners besides aspirin.  Past Medical History:  Diagnosis Date  . A-fib ([place])   . Arthritis   . Cognitive dysfunction   . Colon adenoma   . COPD (chronic obstructive pulmonary disease) ([place])   . Depression   . Familial tremor   . HLD (hyperlipidemia)   . Hypertension   . Osteoarthritis   . Osteoporosis   . Ovarian cyst   . Psoriatic arthropathy ([place])   . Rotator cuff syndrome   . Sigmoid diverticulitis    Past Surgical History:  Procedure Laterality Date  . ABDOMINAL HYSTERECTOMY    . COLONOSCOPY     Social History   Socioeconomic History  . Marital status: Divorced    Spouse name: Not on file  . Number of children: Not on file  . Years of education: Not on file  . Highest education level: Not on file  Occupational History  . Not on file  Tobacco Use  . Smoking status: Former Smoker    Quit date: 05/26/1995    Years since quitting: 25.8  . Smokeless tobacco: Never Used  Substance and Sexual Activity  . Alcohol use: No  . Drug use: Never  . Sexual activity: Not Currently  Other Topics Concern  . Not on file  Social History Narrative  . Not on file   Social Determinants of Health   Financial Resource Strain: Not on file  Food Insecurity: Not on file  Transportation Needs: Not on file  Physical Activity: Not on file  Stress: Not on file  Social Connections: Not on file   Family History  Problem Relation Age of Onset  . Diabetes  Mother   . Ovarian cancer Mother   . Lung cancer Father   . Aneurysm Brother   . Diabetes Brother   . Breast cancer Sister   . Colon cancer Sister   . Diabetes Sister   . Kidney disease Other        nephew  . Bladder Cancer Neg Hx    No Known Allergies Prior to Admission medications   Medication Sig Start Date End Date Taking? Authorizing Provider  albuterol (VENTOLIN HFA) 108 (90 Base) MCG/ACT inhaler Inhale 2 puffs into the lungs every 6 (six) hours as needed for up to 7 days for wheezing or shortness of breath. 08/10/19 08/17/19  Arta Silence, MD  alendronate (FOSAMAX) 70 MG tablet Take 70 mg by mouth once a week. Reported on 01/22/2016    [provider]  aspirin 325 MG EC tablet Take 325 mg by mouth daily.    [provider]  calcium-vitamin D (SM CALCIUM 500/VITAMIN D3) 500-400 MG-UNIT tablet Take by mouth.    [provider]  citalopram (CELEXA) 10 MG tablet Take 10 mg by mouth daily.    [provider]  docusate sodium (COLACE) 100 MG capsule Take 100 mg by mouth daily. Patient not taking: Reported on 08/19/2020    [provider]  ENBREL SURECLICK 50 MG/ML injection Inject 0.8 mLs into the muscle once a week. 08/21/19   [provider]  estradiol (ESTRACE) 0.1 MG/GM vaginal cream INSERT 1 GRAM VAGINALLY 3 TIMES A WEEK. PEA SIZED AMOUNT PER URETHRA Patient not taking: Reported on 09/01/2019 02/14/17   Hollice Espy, MD  gabapentin (NEURONTIN) 300 MG capsule Take 300 mg by mouth daily. 08/17/19   [provider]  Melatonin 5 MG TABS Take 5 mg by mouth at bedtime as needed (sleep).    [provider]  Methotrexate, PF, 25 MG/0.4ML SOAJ Inject 0.8 mLs into the skin once a week. Monday 0800    [provider]  metoprolol tartrate (LOPRESSOR) 25 MG tablet Take 25 mg by mouth 2 (two) times daily.    [provider]  mirtazapine (REMERON) 15 MG tablet Take 15 mg by mouth at bedtime.    [provider]  naproxen sodium (RA NAPROXEN SODIUM) 220 MG tablet Take by mouth. Reported on 01/22/2016    [provider]  Omega-3 Fatty Acids (FISH OIL PO) Take by mouth.    [provider]  ondansetron (ZOFRAN ODT) 4 MG disintegrating tablet Take 1 tablet (4 mg total) by mouth every 8 (eight) hours as needed for nausea or vomiting. Patient not taking: Reported on 09/01/2019 04/24/18   Carrie Mew, MD  senna-docusate (SENOKOT-S) 8.6-50 MG tablet Take 2 tablets by mouth 2 (two) times daily. 05/14/20   Carrie Mew, MD  sodium chloride (OCEAN) 0.65 % SOLN nasal spray Place 1 spray into both nostrils as needed for congestion.    [provider]  traMADol (ULTRAM) 50 MG tablet Take 50 mg by mouth every 6 (six) hours as needed. Patient not taking: Reported on 08/19/2020    [provider]   DG Chest 1 View  Result Date: 03/30/2021 CLINICAL DATA:  Fall. EXAM: CHEST  1 VIEW COMPARISON:  November 25, 2020. FINDINGS: The heart size and mediastinal contours are within normal limits. Both lungs are clear. The visualized skeletal structures are unremarkable. IMPRESSION: No active disease. Electronically Signed   By: Marijo Conception M.D.   On: 03/30/2021 18:41   CT Head Wo Contrast  Result Date: 03/30/2021 CLINICAL DATA:  Head trauma unwitnessed fall EXAM: CT HEAD WITHOUT CONTRAST CT CERVICAL SPINE WITHOUT CONTRAST TECHNIQUE: Multidetector CT imaging of the head and cervical spine was performed following the standard protocol without intravenous contrast. Multiplanar CT image reconstructions of the cervical spine were also generated. COMPARISON:  CT brain and cervical spine 01/22/2021 FINDINGS: CT HEAD FINDINGS Brain: No acute territorial infarction, hemorrhage, or intracranial mass. Advanced white matter hypodensity consistent with chronic small vessel ischemic change. Moderate atrophy. Stable ventricle size. Vascular: No hyperdense vessels.  Carotid vascular  calcification Skull: Normal. Negative for fracture or focal lesion. Sinuses/Orbits: No acute finding. Other: None CT CERVICAL SPINE FINDINGS Alignment: Reversal of cervical lordosis. 3 mm anterolisthesis C3 on C4. Trace retrolisthesis C5 on C6. Trace anterolisthesis C7 on T1. These findings are unchanged. Skull base and vertebrae: No acute fracture. No primary bone lesion or focal pathologic process. Soft tissues and spinal canal: No prevertebral fluid or swelling. No visible canal hematoma. Disc levels: Multiple level degenerative change with moderate severe disc space narrowing and degenerative change at C3-C4, C4-C5 and C5-C6. Facet degenerative changes at multiple levels. Upper chest: Apical emphysema. Heterogeneous thyroid with 2.6 cm hypodense nodule. Other: None IMPRESSION: 1. No CT evidence for acute intracranial abnormality. Atrophy and chronic small vessel ischemic changes  of the white matter. 2. Reversal of cervical lordosis with degenerative changes. No acute osseous abnormality. 3. Heterogeneous thyroid with 2.6 cm hypodense nodule. In the setting of significant comorbidities or limited life expectancy, no follow-up recommended (ref: J Am Coll Radiol. 2015 Feb;12(2): 143-50). Electronically Signed   By: Donavan Foil M.D.   On: 03/30/2021 18:40   CT Cervical Spine Wo Contrast  Result Date: 03/30/2021 CLINICAL DATA:  Head trauma unwitnessed fall EXAM: CT HEAD WITHOUT CONTRAST CT CERVICAL SPINE WITHOUT CONTRAST TECHNIQUE: Multidetector CT imaging of the head and cervical spine was performed following the standard protocol without intravenous contrast. Multiplanar CT image reconstructions of the cervical spine were also generated. COMPARISON:  CT brain and cervical spine 01/22/2021 FINDINGS: CT HEAD FINDINGS Brain: No acute territorial infarction, hemorrhage, or intracranial mass. Advanced white matter hypodensity consistent with chronic small vessel ischemic change. Moderate atrophy. Stable ventricle  size. Vascular: No hyperdense vessels.  Carotid vascular calcification Skull: Normal. Negative for fracture or focal lesion. Sinuses/Orbits: No acute finding. Other: None CT CERVICAL SPINE FINDINGS Alignment: Reversal of cervical lordosis. 3 mm anterolisthesis C3 on C4. Trace retrolisthesis C5 on C6. Trace anterolisthesis C7 on T1. These findings are unchanged. Skull base and vertebrae: No acute fracture. No primary bone lesion or focal pathologic process. Soft tissues and spinal canal: No prevertebral fluid or swelling. No visible canal hematoma. Disc levels: Multiple level degenerative change with moderate severe disc space narrowing and degenerative change at C3-C4, C4-C5 and C5-C6. Facet degenerative changes at multiple levels. Upper chest: Apical emphysema. Heterogeneous thyroid with 2.6 cm hypodense nodule. Other: None IMPRESSION: 1. No CT evidence for acute intracranial abnormality. Atrophy and chronic small vessel ischemic changes of the white matter. 2. Reversal of cervical lordosis with degenerative changes. No acute osseous abnormality. 3. Heterogeneous thyroid with 2.6 cm hypodense nodule. In the setting of significant comorbidities or limited life expectancy, no follow-up recommended (ref: J Am Coll Radiol. 2015 Feb;12(2): 143-50). Electronically Signed   By: Donavan Foil M.D.   On: 03/30/2021 18:40   CT Hip Right Wo Contrast  Result Date: 03/30/2021 CLINICAL DATA:  [place]. Right hip fracture. EXAM: CT OF THE RIGHT HIP WITHOUT CONTRAST TECHNIQUE: Multidetector CT imaging of the right hip was performed according to the standard protocol. Multiplanar CT image reconstructions were also generated. COMPARISON:  Radiographs, same date. FINDINGS: There is a mildly impacted high femoral neck/subcapital fracture of the right hip. The acetabulum is intact. The femoral head is normally located. The pubic symphysis and visualized right SI joint are intact. Moderate degenerative changes at the pubic symphysis. No  right hemipelvic fractures are identified. The right hip and pelvic musculature are grossly normal. No obvious tear or intramuscular hematoma. No significant right-sided intrapelvic abnormalities. Moderate vascular calcifications. IMPRESSION: 1. Mildly impacted high femoral neck/subcapital fracture of the right hip. 2. No right hemipelvic fractures are identified. Electronically Signed   By: Marijo Sanes M.D.   On: 03/30/2021 20:12   DG Hip Unilat W or Wo Pelvis 2-3 Views Right  Result Date: 03/30/2021 CLINICAL DATA:  Right hip pain after fall. EXAM: DG HIP (WITH OR WITHOUT PELVIS) 2-3V RIGHT COMPARISON:  None. FINDINGS: Mildly displaced proximal right femoral neck fracture is noted. Left hip is unremarkable. IMPRESSION: Mildly displaced proximal right femoral neck fracture. Electronically Signed   By: Marijo Conception M.D.   On: 03/30/2021 18:40    Positive ROS: All other systems have been reviewed and were otherwise negative with the exception of those mentioned  in the HPI and as above.  Physical Exam: General: Alert, oriented x1 Cardiovascular: No pedal edema Respiratory: No cyanosis, no use of accessory musculature GI: No organomegaly, abdomen is soft and non-tender Skin: No lesions in the area of chief complaint Neurologic: Sensation intact distally Psychiatric: Patient is competent for consent with normal mood and affect Lymphatic: No axillary or cervical lymphadenopathy  MUSCULOSKELETAL: Mild tenderness to palpation about the right hip, right lower extremity is grossly neurovascular intact  Assessment: 87 year old female with dementia who sustained a mildly displaced, impacted right femoral neck fracture  Plan: Long discussion was had with the patient's daughter, medical power of attorney regarding risks and benefits of operative and nonoperative treatment options.  Risks of surgery discussed include but not limited to bleeding, infection, neurovascular damage, reoperation, risk of  anesthesia, blood clot, etc.  Following discussion, patient's daughter expressed wishes to proceed with surgery.  We will plan for right hip hemiarthroplasty.    Renee Harder, MD  03/31/2021 12:56 PM

## 2021-03-31 NOTE — Plan of Care (Signed)

## 2021-03-31 NOTE — Anesthesia Preprocedure Evaluation (Deleted)
Anesthesia Evaluation    Airway       Dental   Pulmonary former smoker,          Cardiovascular hypertension,     Neuro/Psych    GI/Hepatic   Endo/Other    Renal/GU      Musculoskeletal   Abdominal   Peds  Hematology   Anesthesia Other Findings   Reproductive/Obstetrics                          Anesthesia Physical Anesthesia Plan Anesthesia Quick Evaluation  

## 2021-03-31 NOTE — Anesthesia Preprocedure Evaluation (Signed)
Anesthesia Evaluation  Patient identified by MRN, date of birth, ID band Patient awake    Reviewed: Allergy & Precautions, H&P , NPO status , Patient's Chart, lab work & pertinent test results, reviewed documented beta blocker date and time   History of Anesthesia Complications Negative for: history of anesthetic complications  Airway Mallampati: II  TM Distance: >3 FB Neck ROM: full    Dental  (+) Missing, Dental Advidsory Given, Edentulous Upper, Poor Dentition   Pulmonary neg shortness of breath, neg sleep apnea, COPD, neg recent URI, former smoker,    Pulmonary exam normal breath sounds clear to auscultation       Cardiovascular Exercise Tolerance: Good hypertension, (-) angina(-) Past MI and (-) Cardiac Stents + dysrhythmias Atrial Fibrillation (-) Valvular Problems/Murmurs Rhythm:regular Rate:Normal     Neuro/Psych PSYCHIATRIC DISORDERS Depression Dementia negative neurological ROS     GI/Hepatic negative GI ROS, Neg liver ROS,   Endo/Other  negative endocrine ROS  Renal/GU negative Renal ROS  negative genitourinary   Musculoskeletal   Abdominal   Peds  Hematology negative hematology ROS (+)   Anesthesia Other Findings Past Medical History: No date: A-fib ([facility]) No date: Arthritis No date: Cognitive dysfunction No date: Colon adenoma No date: COPD (chronic obstructive pulmonary disease) ([facility]) No date: Depression No date: Familial tremor No date: HLD (hyperlipidemia) No date: Hypertension No date: Osteoarthritis No date: Osteoporosis No date: Ovarian cyst No date: Psoriatic arthropathy ([facility]) No date: Rotator cuff syndrome No date: Sigmoid diverticulitis   Reproductive/Obstetrics negative OB ROS                             Anesthesia Physical Anesthesia Plan  ASA: III  Anesthesia Plan: Spinal   Post-op Pain Management:    Induction: Intravenous  PONV Risk Score and  Plan: 2 and TIVA and Propofol infusion  Airway Management Planned: Natural Airway and Simple Face Mask  Additional Equipment:   Intra-op Plan:   Post-operative Plan:   Informed Consent: I have reviewed the patients History and Physical, chart, labs and discussed the procedure including the risks, benefits and alternatives for the proposed anesthesia with the patient or authorized representative who has indicated his/her understanding and acceptance.     Dental Advisory Given  Plan Discussed with: Anesthesiologist, CRNA and Surgeon  Anesthesia Plan Comments:         Anesthesia Quick Evaluation

## 2021-04-01 ENCOUNTER — Encounter: Payer: Self-pay | Admitting: Orthopaedic Surgery

## 2021-04-01 ENCOUNTER — Inpatient Hospital Stay: Payer: Medicare HMO

## 2021-04-01 DIAGNOSIS — S72001A Fracture of unspecified part of neck of right femur, initial encounter for closed fracture: Secondary | ICD-10-CM | POA: Diagnosis not present

## 2021-04-01 DIAGNOSIS — R571 Hypovolemic shock: Secondary | ICD-10-CM

## 2021-04-01 DIAGNOSIS — D62 Acute posthemorrhagic anemia: Secondary | ICD-10-CM

## 2021-04-01 LAB — CBC WITH DIFFERENTIAL/PLATELET
Abs Immature Granulocytes: 0.14 10*3/uL — ABNORMAL HIGH (ref 0.00–0.07)
Basophils Absolute: 0.1 10*3/uL (ref 0.0–0.1)
Basophils Relative: 1 %
Eosinophils Absolute: 0.8 10*3/uL — ABNORMAL HIGH (ref 0.0–0.5)
Eosinophils Relative: 5 %
HCT: 30.5 % — ABNORMAL LOW (ref 36.0–46.0)
Hemoglobin: 9.6 g/dL — ABNORMAL LOW (ref 12.0–15.0)
Immature Granulocytes: 1 %
Lymphocytes Relative: 13 %
Lymphs Abs: 2.3 10*3/uL (ref 0.7–4.0)
MCH: 29.7 pg (ref 26.0–34.0)
MCHC: 31.5 g/dL (ref 30.0–36.0)
MCV: 94.4 fL (ref 80.0–100.0)
Monocytes Absolute: 1.6 10*3/uL — ABNORMAL HIGH (ref 0.1–1.0)
Monocytes Relative: 9 %
Neutro Abs: 13.2 10*3/uL — ABNORMAL HIGH (ref 1.7–7.7)
Neutrophils Relative %: 71 %
Platelets: 121 10*3/uL — ABNORMAL LOW (ref 150–400)
RBC: 3.23 MIL/uL — ABNORMAL LOW (ref 3.87–5.11)
RDW: 16.2 % — ABNORMAL HIGH (ref 11.5–15.5)
WBC: 18.2 10*3/uL — ABNORMAL HIGH (ref 4.0–10.5)
nRBC: 0 % (ref 0.0–0.2)

## 2021-04-01 LAB — BASIC METABOLIC PANEL
Anion gap: 6 (ref 5–15)
BUN: 23 mg/dL (ref 8–23)
CO2: 27 mmol/L (ref 22–32)
Calcium: 7.9 mg/dL — ABNORMAL LOW (ref 8.9–10.3)
Chloride: 104 mmol/L (ref 98–111)
Creatinine, Ser: 0.87 mg/dL (ref 0.44–1.00)
GFR, Estimated: >60 mL/min (ref >60)
Glucose, Bld: 75 mg/dL (ref 70–99)
Potassium: 4.1 mmol/L (ref 3.5–5.1)
Sodium: 137 mmol/L (ref 135–145)

## 2021-04-01 LAB — URINALYSIS, COMPLETE (UACMP) WITH MICROSCOPIC
Bacteria, UA: NONE SEEN
Bilirubin Urine: NEGATIVE
Glucose, UA: 50 mg/dL — AB
Ketones, ur: 5 mg/dL — AB
Nitrite: NEGATIVE
Protein, ur: NEGATIVE mg/dL
Specific Gravity, Urine: 1.023 (ref 1.005–1.030)
pH: 5 (ref 5.0–8.0)

## 2021-04-01 LAB — BLOOD GAS, ARTERIAL
Acid-base deficit: 2.5 mmol/L — ABNORMAL HIGH (ref 0.0–2.0)
Bicarbonate: 25.1 mmol/L (ref 20.0–28.0)
FIO2: 0.28
O2 Saturation: 95.1 %
Patient temperature: 37
pCO2 arterial: 56 mmHg — ABNORMAL HIGH (ref 32.0–48.0)
pH, Arterial: 7.26 — ABNORMAL LOW (ref 7.350–7.450)
pO2, Arterial: 87 mmHg (ref 83.0–108.0)

## 2021-04-01 LAB — PROCALCITONIN: Procalcitonin: <0.1 ng/mL

## 2021-04-01 LAB — GLUCOSE, CAPILLARY
Glucose-Capillary: 145 mg/dL — ABNORMAL HIGH (ref 70–99)
Glucose-Capillary: 65 mg/dL — ABNORMAL LOW (ref 70–99)
Glucose-Capillary: 99 mg/dL (ref 70–99)
Glucose-Capillary: 115 mg/dL — ABNORMAL HIGH (ref 70–99)
Glucose-Capillary: 85 mg/dL (ref 70–99)
Glucose-Capillary: 73 mg/dL (ref 70–99)
Glucose-Capillary: 81 mg/dL (ref 70–99)
Glucose-Capillary: 114 mg/dL — ABNORMAL HIGH (ref 70–99)
Glucose-Capillary: 126 mg/dL — ABNORMAL HIGH (ref 70–99)
Glucose-Capillary: 95 mg/dL (ref 70–99)

## 2021-04-01 LAB — HEMOGLOBIN AND HEMATOCRIT, BLOOD
HCT: 30.2 % — ABNORMAL LOW (ref 36.0–46.0)
HCT: 29.9 % — ABNORMAL LOW (ref 36.0–46.0)
HCT: 30.5 % — ABNORMAL LOW (ref 36.0–46.0)
Hemoglobin: 9.7 g/dL — ABNORMAL LOW (ref 12.0–15.0)
Hemoglobin: 9.6 g/dL — ABNORMAL LOW (ref 12.0–15.0)
Hemoglobin: 9.7 g/dL — ABNORMAL LOW (ref 12.0–15.0)

## 2021-04-01 LAB — LACTIC ACID, PLASMA
Lactic Acid, Venous: 1.2 mmol/L (ref 0.5–1.9)
Lactic Acid, Venous: 1.2 mmol/L (ref 0.5–1.9)

## 2021-04-01 MED ORDER — SODIUM CHLORIDE 0.9 % IV SOLN: INTRAVENOUS | Status: DC

## 2021-04-01 MED ORDER — PHENOL 1.4 % MT LIQD
1.0000 | OROMUCOSAL | Status: DC | PRN
  Filled 2021-04-01: qty 177

## 2021-04-01 MED ORDER — SODIUM CHLORIDE 0.9 % IV BOLUS
1000.0000 mL | Freq: Once | INTRAVENOUS | Status: AC
  Administered 2021-04-01: 1000 mL via INTRAVENOUS

## 2021-04-01 MED ORDER — CEFAZOLIN SODIUM-DEXTROSE 1-4 GM/50ML-% IV SOLN
1.0000 g | Freq: Three times a day (TID) | INTRAVENOUS | Status: AC
  Administered 2021-04-01 (×2): 1 g via INTRAVENOUS
  Filled 2021-04-01 (×2): qty 50

## 2021-04-01 MED ORDER — DEXTROSE 50 % IV SOLN
INTRAVENOUS | Status: AC
  Filled 2021-04-01: qty 50

## 2021-04-01 MED ORDER — SODIUM CHLORIDE 0.9 % IV SOLN: 250.0000 mL | INTRAVENOUS | Status: DC

## 2021-04-01 MED ORDER — ASPIRIN EC 325 MG PO TBEC
325.0000 mg | DELAYED_RELEASE_TABLET | Freq: Every day | ORAL | Status: DC
  Administered 2021-04-01 – 2021-04-06 (×6): 325 mg via ORAL
  Filled 2021-04-01 (×6): qty 1

## 2021-04-01 MED ORDER — ENSURE ENLIVE PO LIQD
237.0000 mL | Freq: Two times a day (BID) | ORAL | Status: DC
  Administered 2021-04-01 – 2021-04-05 (×7): 237 mL via ORAL

## 2021-04-01 MED ORDER — SODIUM CHLORIDE 0.9 % IV BOLUS
500.0000 mL | Freq: Once | INTRAVENOUS | Status: AC
  Administered 2021-04-01: 500 mL via INTRAVENOUS

## 2021-04-01 MED ORDER — NOREPINEPHRINE 4 MG/250ML-% IV SOLN
INTRAVENOUS | Status: AC
  Administered 2021-04-01: 2 ug/min via INTRAVENOUS
  Filled 2021-04-01: qty 250

## 2021-04-01 MED ORDER — NOREPINEPHRINE 4 MG/250ML-% IV SOLN: 2.0000 ug/min | INTRAVENOUS | Status: DC

## 2021-04-01 MED ORDER — DEXTROSE 50 % IV SOLN
12.5000 g | INTRAVENOUS | Status: AC
  Administered 2021-04-01: 12.5 g via INTRAVENOUS

## 2021-04-01 MED ORDER — SODIUM CHLORIDE 0.9 % IV SOLN: Freq: Once | INTRAVENOUS | Status: AC

## 2021-04-01 MED ORDER — NOREPINEPHRINE 4 MG/250ML-% IV SOLN
INTRAVENOUS | Status: AC
  Filled 2021-04-01: qty 250

## 2021-04-01 MED ORDER — CHLORHEXIDINE GLUCONATE CLOTH 2 % EX PADS
6.0000 | MEDICATED_PAD | Freq: Every day | CUTANEOUS | Status: DC
  Administered 2021-04-01 – 2021-04-03 (×2): 6 via TOPICAL

## 2021-04-01 MED ORDER — MIDODRINE HCL 5 MG PO TABS
10.0000 mg | ORAL_TABLET | Freq: Once | ORAL | Status: AC
  Administered 2021-04-01: 10 mg via ORAL

## 2021-04-01 MED ORDER — MIDODRINE HCL 5 MG PO TABS: 10.0000 mg | ORAL_TABLET | Freq: Three times a day (TID) | ORAL | Status: DC

## 2021-04-01 MED ORDER — ADULT MULTIVITAMIN W/MINERALS CH
1.0000 | ORAL_TABLET | Freq: Every day | ORAL | Status: DC
  Administered 2021-04-02 – 2021-04-06 (×5): 1 via ORAL
  Filled 2021-04-01 (×5): qty 1

## 2021-04-01 MED ORDER — NOREPINEPHRINE 4 MG/250ML-% IV SOLN: 0.0000 ug/min | INTRAVENOUS | Status: DC

## 2021-04-01 MED ORDER — MENTHOL 3 MG MT LOZG
1.0000 | LOZENGE | OROMUCOSAL | Status: DC | PRN
  Filled 2021-04-01: qty 9

## 2021-04-01 MED ORDER — MIDODRINE HCL 5 MG PO TABS
10.0000 mg | ORAL_TABLET | Freq: Three times a day (TID) | ORAL | Status: DC
  Administered 2021-04-01 – 2021-04-03 (×7): 10 mg via ORAL
  Filled 2021-04-01 (×5): qty 2

## 2021-04-01 NOTE — Evaluation (Signed)
Physical Therapy Evaluation Patient Details Name: Victoria Esparza MRN: [id number] DOB: 11/14/1928 Today's Date: 04/01/2021   History of Present Illness  87 y/o female s/p R hip fx with hemi arthroplasty repair 4/13.  Post surgery she had issues with sustained hypotension and was placed in CCU.  Clinical Impression  Pt was lethargic on arrival, able to stay awake/keep eyes open much of the time but did not have good awareness and repeatedly needed told that she was in the hospital with a broken hip.  Pt was able to give some effort with light supine exercises with plenty of cuing and encouaragement but struggled with mobility and had very poor standing tolerance.    Follow Up Recommendations SNF    Equipment Recommendations   (TBD)    Recommendations for Other Services       Precautions / Restrictions Precautions Precautions: Posterior Hip Restrictions Weight Bearing Restrictions: Yes RLE Weight Bearing: Weight bearing as tolerated      Mobility  Bed Mobility Overal bed mobility: Needs Assistance Bed Mobility: Supine to Sit;Sit to Supine     Supine to sit: Max assist Sit to supine: Max assist   General bed mobility comments: Pt did show some effort in assisting trunk up to sitting, but could not actively get LEs toward EOB    Transfers Overall transfer level: Needs assistance Equipment used: Rolling walker (2 wheeled) Transfers: Sit to/from Stand Sit to Stand: Max assist         General transfer comment: Wtih heavy assist pt was able to get to standing but did not show good confidence and tolerated only ~30 seconds (despite much encouragement and phyiscal assist to stay upright longer) of standing with heavy walker use and struggle to keep hips from drifting down/back.  Ambulation/Gait             General Gait Details: unsafe to attempt today  Stairs            Wheelchair Mobility    Modified Rankin (Stroke Patients Only)       Balance Overall  balance assessment: Needs assistance   Sitting balance-Leahy Scale: Fair Sitting balance - Comments: Pt with some backward lean but with only rare min assist (mostly CGA) was able to maintain sitting EOB   Standing balance support: Bilateral upper extremity supported Standing balance-Leahy Scale: Poor Standing balance comment: hesitant and guarded with standing, constant assist to keep hips from shifting back to the bed                             Pertinent Vitals/Pain Pain Assessment: Faces Faces Pain Scale: Hurts little more Pain Location: mild grimacing in pain with R hip/LE movement    Home Living Family/patient expects to be discharged to:: Skilled nursing facility                      Prior Function           Comments: pt unable to answer consistently but seemed to indicate that she was able to do some ambulation with walker at her facility     Hand Dominance        Extremity/Trunk Assessment   Upper Extremity Assessment Upper Extremity Assessment: Generalized weakness;Difficult to assess due to impaired cognition    Lower Extremity Assessment Lower Extremity Assessment: RLE deficits/detail RLE Deficits / Details: pt with little AROM on R, she did tolerate AA/PROM acts with plenty of  encouragement       Communication   Communication: No difficulties  Cognition Arousal/Alertness: Lethargic Behavior During Therapy: Flat affect Overall Cognitive Status: No family/caregiver present to determine baseline cognitive functioning                                 General Comments: could only report name and day (not month/year) of birth, told multiple times she was in the hospital after breaking her hip yet she continued to ask where she was and what was happening      General Comments General comments (skin integrity, edema, etc.): Pt's BP still low but >809 systolically t/o the session    Exercises Total Joint Exercises Ankle  Circles/Pumps: AROM;10 reps Quad Sets: AROM;10 reps (pt struggled with the concept but did manage 10 reps, some better than others) Short Arc Quad: AAROM;PROM;10 reps Heel Slides: AAROM;10 reps (lightly resisted leg extension per tolerance) Hip ABduction/ADduction: AAROM;10 reps (did not indicated a lot of pain, but struggled to give much AROM effort)   Assessment/Plan    PT Assessment Patient needs continued PT services  PT Problem List Decreased range of motion;Decreased activity tolerance;Decreased balance;Decreased knowledge of use of DME;Decreased safety awareness;Decreased mobility;Decreased knowledge of precautions;Pain;Decreased cognition;Decreased coordination;Decreased strength       PT Treatment Interventions DME instruction;Gait training;Stair training;Functional mobility training;Therapeutic activities;Therapeutic exercise;Balance training;Cognitive remediation;Neuromuscular re-education;Patient/family education    PT Goals (Current goals can be found in the Care Plan section)  Acute Rehab PT Goals Patient Stated Goal: pt confused, none stated PT Goal Formulation: With patient Time For Goal Achievement: 04/15/21 Potential to Achieve Goals: Fair    Frequency BID   Barriers to discharge        Co-evaluation               AM-PAC PT "6 Clicks" Mobility  Outcome Measure Help needed turning from your back to your side while in a flat bed without using bedrails?: A Lot Help needed moving from lying on your back to sitting on the side of a flat bed without using bedrails?: Total Help needed moving to and from a bed to a chair (including a wheelchair)?: Total Help needed standing up from a chair using your arms (e.g., wheelchair or bedside chair)?: Total Help needed to walk in hospital room?: Total Help needed climbing 3-5 steps with a railing? : Total 6 Click Score: 7    End of Session Equipment Utilized During Treatment: Gait belt;Oxygen Activity Tolerance:  Patient limited by lethargy Patient left: with bed alarm set;with call bell/phone within reach Nurse Communication: Mobility status PT Visit Diagnosis: Muscle weakness (generalized) (M62.81);Difficulty in walking, not elsewhere classified (R26.2);Pain Pain - Right/Left: Right Pain - part of body: Hip    Time: 1000-1034 PT Time Calculation (min) (ACUTE ONLY): 34 min   Charges:   PT Evaluation $PT Eval Low Complexity: 1 Low PT Treatments $Therapeutic Exercise: 8-22 mins        Kreg Shropshire, DPT 04/01/2021, 12:27 PM

## 2021-04-01 NOTE — Progress Notes (Addendum)
Progress Note    Victoria Esparza  WHQ:[id number] DOB: January 05, 1928  DOA: 03/30/2021 PCP: Orvis Brill, [facility]      Brief Narrative:    Medical records reviewed and are as summarized below:  Victoria Esparza is a 87 y.o. female       Assessment/Plan:   Principal Problem:   Closed displaced fracture of right femoral neck ([place]) Active Problems:   Benign essential HTN   OP (osteoporosis)   Paroxysmal atrial fibrillation (HCC)   Psoriasis with arthropathy (HCC)   Leukocytosis    Body mass index is 22.4 kg/m.   Displaced, closed proximal right femoral neck fracture: s/p right bipolar hip hemiarthroplasty on 03/31/2021.  Analgesics as needed for pain.  Follow-up with orthopedic surgeon.  Postoperative hypotension: She was transferred to the ICU after surgery for IV fluids and vasopressors.  Plan to wean off vasopressors today.  Acute hypoxic and hypercapnic respiratory failure: Oxygen saturation was 84% on room air.  ABG showed pH 7.26 and PCO2 56.  Continue oxygen via nasal cannula.  Follow-up with intensivist.  Acute blood loss anemia: No indication for blood transfusion at this time.  Monitor H&H.  History of hypertension, paroxysmal atrial fibrillation: Hold metoprolol  Psoriasis with arthropathy: She is on weekly Enbrel and methotrexate  Depression, mild cognitive impairment: Continue psychotropics  COPD: Stable.  Bronchodilators as needed.   Diet Order            Diet regular Room service appropriate? Yes; Fluid consistency: Thin  Diet effective now                    Consultants:  Orthopedic surgeon  Procedures:  Right bipolar hip hemiarthroplasty on 03/31/2021    Medications:   . aspirin EC  325 mg Oral Q breakfast  . calcium-vitamin D  1 tablet Oral Daily  . Chlorhexidine Gluconate Cloth  6 each Topical Q0600  . citalopram  10 mg Oral Daily  . docusate sodium  100 mg Oral BID  . feeding supplement  237 mL Oral BID BM  .  midodrine  10 mg Oral TID WC  . mirtazapine  15 mg Oral QHS  . [START ON 04/02/2021] multivitamin with minerals  1 tablet Oral Daily  . senna-docusate  2 tablet Oral BID   Continuous Infusions: . sodium chloride    . norepinephrine (LEVOPHED) Adult infusion 2 mcg/min (04/01/21 0600)     Anti-infectives (From admission, onward)   Start     Dose/Rate Route Frequency Ordered Stop   04/01/21 0200  ceFAZolin (ANCEF) IVPB 1 g/50 mL premix        1 g 100 mL/hr over 30 Minutes Intravenous Every 8 hours 04/01/21 0134 04/01/21 1358   03/31/21 1842  vancomycin (VANCOCIN) powder  Status:  Discontinued          As needed 03/31/21 1842 03/31/21 1907   03/31/21 1615  ceFAZolin (ANCEF) IVPB 2g/100 mL premix        2 g 200 mL/hr over 30 Minutes Intravenous  Once 03/31/21 1614 03/31/21 1710   03/31/21 1608  ceFAZolin (ANCEF) 2-4 GM/100ML-% IVPB       Note to Pharmacy: Trudie Reed   : cabinet override      03/31/21 1608 03/31/21 1702             Family Communication/Anticipated D/C date and plan/Code Status   DVT prophylaxis: SCDs Start: 03/31/21 2042 SCDs Start: 03/30/21 2055     Code  Status: Full Code  Family Communication: None Disposition Plan:    Status is: Inpatient  Remains inpatient appropriate because:Inpatient level of care appropriate due to severity of illness   Dispo: The patient is from: SNF              Anticipated d/c is to: SNF              Patient currently is not medically stable to d/c.   Difficult to place patient No           Subjective:   Interval events noted.  Patient was transferred to the ICU yesterday after hip surgery because of refractory hypotension.  She is not able to provide an adequate history.  She denies any pain in the hip at this time.  Her nurse, Caryl Pina, was at the bedside.  Objective:    Vitals:   04/01/21 0900 04/01/21 1000 04/01/21 1100 04/01/21 1200  BP: 91/61 110/64 (!) 102/57 (!) 102/50  Pulse: 82 92  72  Resp: 17 17  19 18   Temp:      TempSrc:      SpO2: 99% 99%  97%  Weight:      Height:       No data found.   Intake/Output Summary (Last 24 hours) at 04/01/2021 1431 Last data filed at 04/01/2021 1046 Gross per 24 hour  Intake 3063.64 ml  Output 880 ml  Net 2183.64 ml   Filed Weights   03/30/21 1644 04/01/21 0125  Weight: 64 kg 59.2 kg    Exam:   GEN: NAD SKIN: Warm and dry EYES: No pallor or icterus ENT: MMM CV: RRR PULM: CTA B ABD: soft, ND, NT, +BS CNS: AAO x 1 (person) , non focal EXT: Dressing on right hip surgical wound is intact, clean and dry.          Data Reviewed:   I have personally reviewed following labs and imaging studies:  Labs: Labs show the following:   Basic Metabolic Panel: Recent Labs  Lab 03/30/21 1933 03/31/21 0449 04/01/21 0119  NA 135 135 137  K 4.3 4.1 4.1  CL 99 100 104  CO2 27 28 27   GLUCOSE 95 91 75  BUN 17 17 23   CREATININE 0.84 1.05* 0.87  CALCIUM 9.2 8.5* 7.9*   GFR Estimated Creatinine Clearance: 37.1 mL/min (by C-G formula based on SCr of 0.87 mg/dL). Liver Function Tests: Recent Labs  Lab 03/30/21 1933  AST 22  ALT 13  ALKPHOS 61  BILITOT 0.6  PROT 6.8  ALBUMIN 3.3*   No results for input(s): LIPASE, AMYLASE in the last 168 hours. No results for input(s): AMMONIA in the last 168 hours. Coagulation profile No results for input(s): INR, PROTIME in the last 168 hours.  CBC: Recent Labs  Lab 03/30/21 1933 03/31/21 0449 04/01/21 0119 04/01/21 0658 04/01/21 1301  WBC 20.7* 19.0* 18.2*  --   --   NEUTROABS 16.7*  --  13.2*  --   --   HGB 13.0 11.8* 9.6* 9.7* 9.6*  HCT 40.1 36.2 30.5* 30.2* 29.9*  MCV 90.9 90.7 94.4  --   --   PLT 161 149* 121*  --   --    Cardiac Enzymes: No results for input(s): CKTOTAL, CKMB, CKMBINDEX, TROPONINI in the last 168 hours. BNP (last 3 results) No results for input(s): PROBNP in the last 8760 hours. CBG: Recent Labs  Lab 04/01/21 0416 04/01/21 0600 04/01/21 0711  04/01/21 0931 04/01/21 1151  GLUCAP  99 115* 85 73 81   D-Dimer: No results for input(s): DDIMER in the last 72 hours. Hgb A1c: No results for input(s): HGBA1C in the last 72 hours. Lipid Profile: No results for input(s): CHOL, HDL, LDLCALC, TRIG, CHOLHDL, LDLDIRECT in the last 72 hours. Thyroid function studies: No results for input(s): TSH, T4TOTAL, T3FREE, THYROIDAB in the last 72 hours.  Invalid input(s): FREET3 Anemia work up: No results for input(s): VITAMINB12, FOLATE, FERRITIN, TIBC, IRON, RETICCTPCT in the last 72 hours. Sepsis Labs: Recent Labs  Lab 03/30/21 1933 03/31/21 0449 04/01/21 0119 04/01/21 0405 04/01/21 0423 04/01/21 0658  PROCALCITON  --   --   --  <0.10  --   --   WBC 20.7* 19.0* 18.2*  --   --   --   LATICACIDVEN  --   --   --   --  1.2 1.2    Microbiology Recent Results (from the past 240 hour(s))  Resp Panel by RT-PCR (Flu A&B, Covid) Nasopharyngeal Swab     Status: None   Collection Time: 03/30/21  8:06 PM   Specimen: Nasopharyngeal Swab; Nasopharyngeal(NP) swabs in vial transport medium  Result Value Ref Range Status   SARS Coronavirus 2 by RT PCR NEGATIVE NEGATIVE Final    Comment: (NOTE) SARS-CoV-2 target nucleic acids are NOT DETECTED.  The SARS-CoV-2 RNA is generally detectable in upper respiratory specimens during the acute phase of infection. The lowest concentration of SARS-CoV-2 viral copies this assay can detect is 138 copies/mL. A negative result does not preclude SARS-Cov-2 infection and should not be used as the sole basis for treatment or other patient management decisions. A negative result may occur with  improper specimen collection/handling, submission of specimen other than nasopharyngeal swab, presence of viral mutation(s) within the areas targeted by this assay, and inadequate number of viral copies(<138 copies/mL). A negative result must be combined with clinical observations, patient history, and  epidemiological information. The expected result is Negative.  Fact Sheet for Patients:  EntrepreneurPulse.com.au  Fact Sheet for Healthcare Providers:  IncredibleEmployment.be  This test is no t yet approved or cleared by the [place] FDA and  has been authorized for detection and/or diagnosis of SARS-CoV-2 by FDA under an Emergency Use Authorization (EUA). This EUA will remain  in effect (meaning this test can be used) for the duration of the COVID-19 declaration under Section 564(b)(1) of the Act, 21 U.S.C.section 360bbb-3(b)(1), unless the authorization is terminated  or revoked sooner.       Influenza A by PCR NEGATIVE NEGATIVE Final   Influenza B by PCR NEGATIVE NEGATIVE Final    Comment: (NOTE) The Xpert Xpress SARS-CoV-2/FLU/RSV plus assay is intended as an aid in the diagnosis of influenza from Nasopharyngeal swab specimens and should not be used as a sole basis for treatment. Nasal washings and aspirates are unacceptable for Xpert Xpress SARS-CoV-2/FLU/RSV testing.  Fact Sheet for Patients: EntrepreneurPulse.com.au  Fact Sheet for Healthcare Providers: IncredibleEmployment.be  This test is not yet approved or cleared by the [place] FDA and has been authorized for detection and/or diagnosis of SARS-CoV-2 by FDA under an Emergency Use Authorization (EUA). This EUA will remain in effect (meaning this test can be used) for the duration of the COVID-19 declaration under Section 564(b)(1) of the Act, 21 U.S.C. section 360bbb-3(b)(1), unless the authorization is terminated or revoked.  Performed at [facility], [address], [place], [place] [address]     Procedures and diagnostic studies:  DG Chest 1 View  Result Date: 04/01/2021 CLINICAL DATA:  Sepsis EXAM: CHEST  1 VIEW COMPARISON:  03/30/2021 FINDINGS: Pulmonary interstitial prominence is likely artifactual in nature  in related to radiographic technique given its development since prior examination. No focal pulmonary infiltrate. No pneumothorax or pleural effusion. Cardiac size is within normal limits. Pulmonary vascularity is normal. No acute bone abnormality. IMPRESSION: No active disease. Electronically Signed   By: Fidela Salisbury MD   On: 04/01/2021 05:17   DG Chest 1 View  Result Date: 03/30/2021 CLINICAL DATA:  Fall. EXAM: CHEST  1 VIEW COMPARISON:  November 25, 2020. FINDINGS: The heart size and mediastinal contours are within normal limits. Both lungs are clear. The visualized skeletal structures are unremarkable. IMPRESSION: No active disease. Electronically Signed   By: Marijo Conception M.D.   On: 03/30/2021 18:41   CT Head Wo Contrast  Result Date: 03/30/2021 CLINICAL DATA:  Head trauma unwitnessed fall EXAM: CT HEAD WITHOUT CONTRAST CT CERVICAL SPINE WITHOUT CONTRAST TECHNIQUE: Multidetector CT imaging of the head and cervical spine was performed following the standard protocol without intravenous contrast. Multiplanar CT image reconstructions of the cervical spine were also generated. COMPARISON:  CT brain and cervical spine 01/22/2021 FINDINGS: CT HEAD FINDINGS Brain: No acute territorial infarction, hemorrhage, or intracranial mass. Advanced white matter hypodensity consistent with chronic small vessel ischemic change. Moderate atrophy. Stable ventricle size. Vascular: No hyperdense vessels.  Carotid vascular calcification Skull: Normal. Negative for fracture or focal lesion. Sinuses/Orbits: No acute finding. Other: None CT CERVICAL SPINE FINDINGS Alignment: Reversal of cervical lordosis. 3 mm anterolisthesis C3 on C4. Trace retrolisthesis C5 on C6. Trace anterolisthesis C7 on T1. These findings are unchanged. Skull base and vertebrae: No acute fracture. No primary bone lesion or focal pathologic process. Soft tissues and spinal canal: No prevertebral fluid or swelling. No visible canal hematoma. Disc  levels: Multiple level degenerative change with moderate severe disc space narrowing and degenerative change at C3-C4, C4-C5 and C5-C6. Facet degenerative changes at multiple levels. Upper chest: Apical emphysema. Heterogeneous thyroid with 2.6 cm hypodense nodule. Other: None IMPRESSION: 1. No CT evidence for acute intracranial abnormality. Atrophy and chronic small vessel ischemic changes of the white matter. 2. Reversal of cervical lordosis with degenerative changes. No acute osseous abnormality. 3. Heterogeneous thyroid with 2.6 cm hypodense nodule. In the setting of significant comorbidities or limited life expectancy, no follow-up recommended (ref: J Am Coll Radiol. 2015 Feb;12(2): 143-50). Electronically Signed   By: Donavan Foil M.D.   On: 03/30/2021 18:40   CT Cervical Spine Wo Contrast  Result Date: 03/30/2021 CLINICAL DATA:  Head trauma unwitnessed fall EXAM: CT HEAD WITHOUT CONTRAST CT CERVICAL SPINE WITHOUT CONTRAST TECHNIQUE: Multidetector CT imaging of the head and cervical spine was performed following the standard protocol without intravenous contrast. Multiplanar CT image reconstructions of the cervical spine were also generated. COMPARISON:  CT brain and cervical spine 01/22/2021 FINDINGS: CT HEAD FINDINGS Brain: No acute territorial infarction, hemorrhage, or intracranial mass. Advanced white matter hypodensity consistent with chronic small vessel ischemic change. Moderate atrophy. Stable ventricle size. Vascular: No hyperdense vessels.  Carotid vascular calcification Skull: Normal. Negative for fracture or focal lesion. Sinuses/Orbits: No acute finding. Other: None CT CERVICAL SPINE FINDINGS Alignment: Reversal of cervical lordosis. 3 mm anterolisthesis C3 on C4. Trace retrolisthesis C5 on C6. Trace anterolisthesis C7 on T1. These findings are unchanged. Skull base and vertebrae: No acute fracture. No primary bone lesion or focal pathologic process. Soft tissues and spinal canal: No  prevertebral fluid  or swelling. No visible canal hematoma. Disc levels: Multiple level degenerative change with moderate severe disc space narrowing and degenerative change at C3-C4, C4-C5 and C5-C6. Facet degenerative changes at multiple levels. Upper chest: Apical emphysema. Heterogeneous thyroid with 2.6 cm hypodense nodule. Other: None IMPRESSION: 1. No CT evidence for acute intracranial abnormality. Atrophy and chronic small vessel ischemic changes of the white matter. 2. Reversal of cervical lordosis with degenerative changes. No acute osseous abnormality. 3. Heterogeneous thyroid with 2.6 cm hypodense nodule. In the setting of significant comorbidities or limited life expectancy, no follow-up recommended (ref: J Am Coll Radiol. 2015 Feb;12(2): 143-50). Electronically Signed   By: Donavan Foil M.D.   On: 03/30/2021 18:40   DG Pelvis Portable  Result Date: 03/31/2021 CLINICAL DATA:  Postop. EXAM: PORTABLE PELVIS 1-2 VIEWS COMPARISON:  Preoperative imaging yesterday. FINDINGS: Right hip arthroplasty in expected alignment. The distal most femoral stem is not entirely included in the field of view. There is no periprosthetic lucency or fracture. Recent postsurgical change includes air and edema in the joint space and soft tissues. Lateral skin staples. IMPRESSION: Right hip arthroplasty without immediate postoperative complication. Electronically Signed   By: Keith Rake M.D.   On: 03/31/2021 19:52   CT Hip Right Wo Contrast  Result Date: 03/30/2021 CLINICAL DATA:  [place]. Right hip fracture. EXAM: CT OF THE RIGHT HIP WITHOUT CONTRAST TECHNIQUE: Multidetector CT imaging of the right hip was performed according to the standard protocol. Multiplanar CT image reconstructions were also generated. COMPARISON:  Radiographs, same date. FINDINGS: There is a mildly impacted high femoral neck/subcapital fracture of the right hip. The acetabulum is intact. The femoral head is normally located. The pubic symphysis  and visualized right SI joint are intact. Moderate degenerative changes at the pubic symphysis. No right hemipelvic fractures are identified. The right hip and pelvic musculature are grossly normal. No obvious tear or intramuscular hematoma. No significant right-sided intrapelvic abnormalities. Moderate vascular calcifications. IMPRESSION: 1. Mildly impacted high femoral neck/subcapital fracture of the right hip. 2. No right hemipelvic fractures are identified. Electronically Signed   By: Marijo Sanes M.D.   On: 03/30/2021 20:12   DG Hip Unilat W or Wo Pelvis 2-3 Views Right  Result Date: 03/30/2021 CLINICAL DATA:  Right hip pain after fall. EXAM: DG HIP (WITH OR WITHOUT PELVIS) 2-3V RIGHT COMPARISON:  None. FINDINGS: Mildly displaced proximal right femoral neck fracture is noted. Left hip is unremarkable. IMPRESSION: Mildly displaced proximal right femoral neck fracture. Electronically Signed   By: Marijo Conception M.D.   On: 03/30/2021 18:40               LOS: 2 days   Dimitrious Micciche  [facility]   Pager on [email or web address]. If 7PM-7AM, please contact night-coverage at [email or web address]     04/01/2021, 2:31 PM

## 2021-04-01 NOTE — Progress Notes (Signed)
   04/01/21 0010 04/01/21 0051  Assess: MEWS Score  BP (!) 75/45 (!) 75/42  Pulse Rate 61 67  Resp 16 17  SpO2 95 % 99 %  O2 Device Nasal Cannula Nasal Cannula  O2 Flow Rate (L/min) 2 L/min 2 L/min

## 2021-04-01 NOTE — Progress Notes (Signed)
Physical Therapy Treatment Patient Details Name: Victoria Esparza MRN: [id number] DOB: [birth year] Today's Date: 04/01/2021    History of Present Illness 87 y/o female s/p R hip fx with hemi arthroplasty repair 4/13.  Post surgery she had issues with sustained hypotension and was placed in CCU.    PT Comments    Pt much more awake but still having a lot of confusion (which apparently is essentially baseline).  She did much better with AROM/resisted exercises this afternoon and actually performed them relatively well.  She was willing to try and get to standing though initially showed shock that we would try until I reminded her we did this AM.  Regardless she was unable to attempt standing as in sitting she had an episode where she could not keep her balance, could not hold on to walker/hand rail, had drop in O2 (not gripping anything tightly, seemed to legitimately drop to low 80s) and had global tremoring.  She stabilized quickly once back in supine with BP 92/55 (but congruent with most recent BP reading), nursing notified of episode.  Follow Up Recommendations  SNF     Equipment Recommendations  Rolling walker with 5" wheels    Recommendations for Other Services       Precautions / Restrictions Precautions Precautions: Posterior Hip Restrictions RLE Weight Bearing: Weight bearing as tolerated    Mobility  Bed Mobility Overal bed mobility: Needs Assistance Bed Mobility: Supine to Sit;Sit to Supine     Supine to sit: Mod assist Sit to supine: Max assist   General bed mobility comments: Pt again able to give only minimal effort with getting to/from sitting. In sitting pt started having drop in O2 (low 80s), mild tremoring and could not keep hands holding onto hand rail, walker, etc with multiple "slips" and forward leans prompting PT to quickly get her back to supine.  BP was low on testing, but similar to most of her recent readings, O2 quickly back to 90s, pt reports feeling  fine once back in supine.  nursing was notified    Transfers                 General transfer comment: deferred this afternoon secondary to episode of lack of control of limbs and balance  Ambulation/Gait                 Stairs             Wheelchair Mobility    Modified Rankin (Stroke Patients Only)       Balance Overall balance assessment: Needs assistance   Sitting balance-Leahy Scale: Poor Sitting balance - Comments: Pt with episode of unsteadiness in sitting EOB seeming to almost fall asleep and subsequently lean forward in an uncontrolled way.  Intervention needed X 3 before PT decided to get her back to supine                                    Cognition Arousal/Alertness: Awake/alert Behavior During Therapy: Flat affect Overall Cognitive Status: History of cognitive impairments - at baseline                                 General Comments: much more awake this afternoon, but still very confused      Exercises Total Joint Exercises Ankle Circles/Pumps: AROM;10 reps Quad Sets: AROM;10 reps  Short Arc Quad: Strengthening;15 reps (pt much better this afternoon, AROM against resistance relatively consistently) Heel Slides: Strengthening;AROM (resisted leg ext) Hip ABduction/ADduction: Strengthening;10 reps (true AROM again light resist with each rep)    General Comments        Pertinent Vitals/Pain Faces Pain Scale: Hurts a little bit Pain Location: pt only indicates minimal pain with R LE movement    Home Living                      Prior Function            PT Goals (current goals can now be found in the care plan section) Progress towards PT goals: Progressing toward goals    Frequency    BID      PT Plan Current plan remains appropriate    Co-evaluation              AM-PAC PT "6 Clicks" Mobility   Outcome Measure  Help needed turning from your back to your side while in a  flat bed without using bedrails?: A Lot Help needed moving from lying on your back to sitting on the side of a flat bed without using bedrails?: Total Help needed moving to and from a bed to a chair (including a wheelchair)?: Total Help needed standing up from a chair using your arms (e.g., wheelchair or bedside chair)?: Total Help needed to walk in hospital room?: Total Help needed climbing 3-5 steps with a railing? : Total 6 Click Score: 7    End of Session Equipment Utilized During Treatment: Gait belt;Oxygen Activity Tolerance: Patient limited by lethargy Patient left: with bed alarm set;with call bell/phone within reach Nurse Communication: Mobility status PT Visit Diagnosis: Muscle weakness (generalized) (M62.81);Difficulty in walking, not elsewhere classified (R26.2);Pain Pain - Right/Left: Right Pain - part of body: Hip     Time: 1194-1740 PT Time Calculation (min) (ACUTE ONLY): 28 min  Charges:  $Therapeutic Exercise: 8-22 mins $Therapeutic Activity: 8-22 mins                     Kreg Shropshire, DPT 04/01/2021, 3:05 PM

## 2021-04-01 NOTE — Progress Notes (Signed)
Pt admitted from 1A for hypotension 75/45 with ongoing 1L saline bolus. Pt drowsy, but arousable with voice. BG= 65, prn D50% 25 mls given, BG 145 after recheck. R hip dressing intact, RLE neurovascularly stable. Remained hypotensive post NS bolus and trial midodrine thus Levo started at 2 mcg/hr to maintain MAP >65. Foley catheter maintained for now, UO adequate. 1A RN will be informing relative of transfer to ICU this AM.   Plan for ABG at 0830.

## 2021-04-01 NOTE — Anesthesia Postprocedure Evaluation (Signed)
Anesthesia Post Note  Patient: Victoria Esparza  Procedure(s) Performed: ARTHROPLASTY BIPOLAR HIP (HEMIARTHROPLASTY) (Right Hip)  Patient location during evaluation: ICU Anesthesia Type: Spinal Level of consciousness: awake and alert Pain management: pain level controlled Vital Signs Assessment: post-procedure vital signs reviewed and stable Respiratory status: spontaneous breathing, respiratory function stable and patient connected to nasal cannula oxygen Cardiovascular status: blood pressure returned to baseline and stable Postop Assessment: no headache, no backache, no apparent nausea or vomiting and patient able to bend at knees Anesthetic complications: no   No complications documented.   Last Vitals:  Vitals:   04/01/21 0630 04/01/21 0700  BP: 110/67 119/68  Pulse: 89 91  Resp: 15 15  Temp:    SpO2: 100% 99%    Last Pain:  Vitals:   04/01/21 0355  TempSrc: Oral  PainSc:                  Norm Salt

## 2021-04-01 NOTE — Progress Notes (Signed)
Critical care note:  Date of note:04/01/2021  Subjective: The patient is status post right hip hemiarthroplasty for right femoral neck fracture and took her scheduled Lopressor 25 mg p.o..  She developed hypotension tonight with a blood pressure of 75/45 with a MAP of 55 and heart rate of 67 and temperature of 97.3.  She was arousable.  She was given 500 mill IV normal saline bolus and repeat blood pressure was 75/42 for which she was given 1 L wide-open normal saline with 10 mg of p.o. midodrine and was transferred to stepdown.  Stat H&H came back 9.6 and 30.5 down from 11.8 and 36.2.  We need to notify Dr. Sharlet Salina about the patient.  After her bolus she was placed on maintenance infusion with 150 mill per hour.  Unfortunately after that blood pressure was still 89/49 with a MAP of 60 and therefore we ordered IV Levophed and the patient was transferred to an ICU status with intensivist consult.  Objective: Physical examination: Generally: Acutely ill somnolent but arousable elderly Caucasian female in no respiratory distress. Vital signs as above.   Head - atraumatic, normocephalic.  Pupils - equal, round and reactive to light and accommodation. Extraocular movements are intact. No scleral icterus.  Oropharynx - moist mucous membranes and tongue. No pharyngeal erythema or exudate.  Neck - supple. No JVD. Carotid pulses 2+ bilaterally. No carotid bruits. No palpable thyromegaly or lymphadenopathy. Cardiovascular - regular rate and rhythm. Normal S1 and S2. No murmurs, gallops or rubs.  Lungs - clear to auscultation bilaterally.  Abdomen - soft and nontender. Positive bowel sounds. No palpable organomegaly or masses.  Extremities - no pitting edema, clubbing or cyanosis.  Neuro - grossly non-focal. Skin - no rashes. Breast, pelvic and rectal - deferred.  Notes and labs were reviewed.  Assessment/plan: Hypovolemic shock possibly secondary to blood loss with acute blood loss anemia status post  Hemi arthroplasty of the right hip. - The patient was transferred to an ICU status and will be started on IV Levophed. - We will continue aggressive hydration with IV normal saline. - We will continue her midodrine for now. - We will hold off antihypertensives. - We will hold off NSAIDs. - We will follow serial hemoglobins and hematocrits.  With further drop she may need packed RBCs. - I discussed the case with Dr. Sharlet Salina who is aware about the patient. - We will continue other plan of care.  Authorized and performed by: Eugenie Norrie, MD Total critical care time: Approximately   35    minutes. Due to a high probability of clinically significant, life-threatening deterioration, the patient required my highest level of preparedness to intervene emergently and I personally spent this critical care time directly and personally managing the patient.  This critical care time included obtaining a history, examining the patient, pulse oximetry, ordering and review of studies, arranging urgent treatment with development of management plan, evaluation of patient's response to treatment, frequent reassessment, and discussions with other providers. This critical care time was performed to assess and manage the high probability of imminent, life-threatening deterioration that could result in multiorgan failure.  It was exclusive of separately billable procedures and treating other patients and teaching time.  Please see MDM section and the rest of the note for further information on patient assessment and treatment.

## 2021-04-01 NOTE — Consult Note (Signed)
NAME:  Victoria Esparza, MRN:  [id number], DOB:  1928/10/09, LOS: 2 ADMISSION DATE:  03/30/2021, CONSULTATION DATE:  04/01/21 REFERRING MD:  Sidney Ace, CHIEF COMPLAINT:  fall   History of Present Illness:  87 year old woman with PMH below presenting from SNF after witnessed fall with displaced R femoral hip fx.  Underwent fixation at 7PM yesterday evening.  Seems to have been hypotensive ever since returning from OR despite fluids and trial of midodrine.  PCCM consulted for postop hypotension as patient now started on pressors.  Pertinent  Medical History  "moderate dementia" Afib COPD Depression HLD HTN OA Rolling walker baseline  Significant Hospital Events: Including procedures, antibiotic start and stop dates in addition to other pertinent events   . 4/13 arrival to ER, 4/14 evening to OR, postop hypotensive  Interim History / Subjective:  Patient not providing me any subjective.  Objective   Blood pressure (!) 92/54, pulse (!) 55, temperature (!) 97.3 F (36.3 C), temperature source Oral, resp. rate 15, height 5\' 4"  (1.626 m), weight 59.2 kg, SpO2 99 %.        Intake/Output Summary (Last 24 hours) at 04/01/2021 0354 Last data filed at 04/01/2021 0300 Gross per 24 hour  Intake 2599.11 ml  Output 410 ml  Net 2189.11 ml   Filed Weights   03/30/21 1644 04/01/21 0125  Weight: 64 kg 59.2 kg    Examination: General: cachetic elderly woman lying in bed HENT: MM dry trachea midline Lungs: Clear, shallow pursed lip breathing Cardiovascular: irregular, +SEM Abdomen: soft, +BS Extremities: surgical site appears soft, no obvious bruising or swelling Neuro: moves all 4 ext but not following commands GU: foley in place with clear urine  Labs/imaging that I havepersonally reviewed  (right click and "Reselect all SmartList Selections" daily)  Elevated WBC Cr okay UA bland 4/12  Resolved Hospital Problem list   n/a  Assessment & Plan:  -Post-op hypotension- some drop in H/H  but did receive fair bit of fluids.  Suspicion for fluid shifts and should r/o infection. -Post-op AMS- suspect delirium postop on top of baseline dementia; question hypoglycemia -Abnormal respiratory pattern- protecting airway for now -RA on enbrel and MTX at home -87 year old SNF dementia patient with hip fracture- probably need GOC discussion  Overall I would just give her more time for sedation to get out of system  - Check ABG/Pct, CXR, and repeat UA; low threshold for abx - Hold all antihypertensives, pain meds for now - Watch h/h as ordered; transfuse for hgb 8 - Levophed titrated to MAP 65 - Start CBG checks q2h, supplemental glucose PRN - Palliative consult, if she takes turn I do not think she would do well with aggressive intervention  Best practice (right click and "Reselect all SmartList Selections" daily)  Diet:  NPO Pain/Anxiety/Delirium protocol (if indicated): No VAP protocol (if indicated): Not indicated DVT prophylaxis: Contraindicated GI prophylaxis: N/A Glucose control:  SSI No Central venous access:  N/A Arterial line:  N/A Foley:  Yes, and it is still needed Mobility:  bed rest  PT consulted: Yes Last date of multidisciplinary goals of care discussion [pending] Code Status:  full code Disposition: ICU pending pressor liberation  Labs   CBC: Recent Labs  Lab 03/30/21 1933 03/31/21 0449 04/01/21 0119  WBC 20.7* 19.0* 18.2*  NEUTROABS 16.7*  --  13.2*  HGB 13.0 11.8* 9.6*  HCT 40.1 36.2 30.5*  MCV 90.9 90.7 94.4  PLT 161 149* 121*    Basic Metabolic  Panel: Recent Labs  Lab 03/30/21 1933 03/31/21 0449 04/01/21 0119  NA 135 135 137  K 4.3 4.1 4.1  CL 99 100 104  CO2 27 28 27   GLUCOSE 95 91 75  BUN 17 17 23   CREATININE 0.84 1.05* 0.87  CALCIUM 9.2 8.5* 7.9*   GFR: Estimated Creatinine Clearance: 37.1 mL/min (by C-G formula based on SCr of 0.87 mg/dL). Recent Labs  Lab 03/30/21 1933 03/31/21 0449 04/01/21 0119  WBC 20.7* 19.0* 18.2*     Liver Function Tests: Recent Labs  Lab 03/30/21 1933  AST 22  ALT 13  ALKPHOS 61  BILITOT 0.6  PROT 6.8  ALBUMIN 3.3*   No results for input(s): LIPASE, AMYLASE in the last 168 hours. No results for input(s): AMMONIA in the last 168 hours.  ABG No results found for: PHART, PCO2ART, PO2ART, HCO3, TCO2, ACIDBASEDEF, O2SAT   Coagulation Profile: No results for input(s): INR, PROTIME in the last 168 hours.  Cardiac Enzymes: No results for input(s): CKTOTAL, CKMB, CKMBINDEX, TROPONINI in the last 168 hours.  HbA1C: No results found for: HGBA1C  CBG: Recent Labs  Lab 04/01/21 0130 04/01/21 0205  GLUCAP 65* 145*    Review of Systems:   Cannot assess due to AMS  Past Medical History:  She,  has a past medical history of A-fib ([place]), Arthritis, Cognitive dysfunction, Colon adenoma, COPD (chronic obstructive pulmonary disease) ([place]), Depression, Familial tremor, HLD (hyperlipidemia), Hypertension, Osteoarthritis, Osteoporosis, Ovarian cyst, Psoriatic arthropathy ([place]), Rotator cuff syndrome, and Sigmoid diverticulitis.   Surgical History:   Past Surgical History:  Procedure Laterality Date  . ABDOMINAL HYSTERECTOMY    . COLONOSCOPY       Social History:   reports that she quit smoking about 25 years ago. She has never used smokeless tobacco. She reports that she does not drink alcohol and does not use drugs.   Family History:  Her family history includes Aneurysm in her brother; Breast cancer in her sister; Colon cancer in her sister; Diabetes in her brother, mother, and sister; Kidney disease in an other family member; Lung cancer in her father; Ovarian cancer in her mother. There is no history of Bladder Cancer.   Allergies No Known Allergies   Home Medications  Prior to Admission medications   Medication Sig Start Date End Date Taking? Authorizing Provider  albuterol (VENTOLIN HFA) 108 (90 Base) MCG/ACT inhaler Inhale 2 puffs into the lungs every 6 (six)  hours as needed for up to 7 days for wheezing or shortness of breath. 08/10/19 08/17/19  Arta Silence, MD  alendronate (FOSAMAX) 70 MG tablet Take 70 mg by mouth once a week. Reported on 01/22/2016    [provider]  aspirin 325 MG EC tablet Take 325 mg by mouth daily.    [provider]  calcium-vitamin D (SM CALCIUM 500/VITAMIN D3) 500-400 MG-UNIT tablet Take by mouth.    [provider]  citalopram (CELEXA) 10 MG tablet Take 10 mg by mouth daily.    [provider]  docusate sodium (COLACE) 100 MG capsule Take 100 mg by mouth daily. Patient not taking: Reported on 08/19/2020    [provider]  ENBREL SURECLICK 50 MG/ML injection Inject 0.8 mLs into the muscle once a week. 08/21/19   [provider]  estradiol (ESTRACE) 0.1 MG/GM vaginal cream INSERT 1 GRAM VAGINALLY 3 TIMES A WEEK. PEA SIZED AMOUNT PER URETHRA Patient not taking: Reported on 09/01/2019 02/14/17   Hollice Espy, MD  gabapentin (NEURONTIN) 300 MG  capsule Take 300 mg by mouth daily. 08/17/19   [provider]  Melatonin 5 MG TABS Take 5 mg by mouth at bedtime as needed (sleep).    [provider]  Methotrexate, PF, 25 MG/0.4ML SOAJ Inject 0.8 mLs into the skin once a week. Monday 0800    [provider]  metoprolol tartrate (LOPRESSOR) 25 MG tablet Take 25 mg by mouth 2 (two) times daily.    [provider]  mirtazapine (REMERON) 15 MG tablet Take 15 mg by mouth at bedtime.    [provider]  naproxen sodium (RA NAPROXEN SODIUM) 220 MG tablet Take by mouth. Reported on 01/22/2016    [provider]  Omega-3 Fatty Acids (FISH OIL PO) Take by mouth.    [provider]  ondansetron (ZOFRAN ODT) 4 MG disintegrating tablet Take 1 tablet (4 mg total) by mouth every 8 (eight) hours as needed for nausea or vomiting. Patient not taking: Reported on 09/01/2019 04/24/18   Carrie Mew, MD  senna-docusate (SENOKOT-S) 8.6-50 MG  tablet Take 2 tablets by mouth 2 (two) times daily. 05/14/20   Carrie Mew, MD  sodium chloride (OCEAN) 0.65 % SOLN nasal spray Place 1 spray into both nostrils as needed for congestion.    [provider]  traMADol (ULTRAM) 50 MG tablet Take 50 mg by mouth every 6 (six) hours as needed. Patient not taking: Reported on 08/19/2020    [provider]     Critical care time: 35 minutes not including any separately billable procedures

## 2021-04-01 NOTE — Progress Notes (Signed)
0030 Arrived at bedside after doing proactive [place] rounding noted patients BP 75/45 at 0010 - Provider [facility] MD aware  -500 ml Ns bolus ordered and given -Patient somnolent but arousable to voice and ableto follow commands 0051 - 500 bolus finish BP 75/42 Hr 67  -Mansy MD aware  -1 liter bolus of NS order -Stat H&H ordered  -Transfer orders to SD placed 0115  Patient arrived to SD unit

## 2021-04-01 NOTE — Progress Notes (Signed)
eLink Physician-Brief Progress Note Patient Name: Victoria Esparza DOB: 30-Oct-1928 MRN: [id number]   Date of Service  04/01/2021  HPI/Events of Note  VBG on [place] O2 = .23/66  eICU Interventions  Plan: 1. Continue present management. 2. Repeat ABG at 8:30 AM.     Intervention Category Major Interventions: Acid-Base disturbance - evaluation and management  Noam Karaffa Eugene 04/01/2021, 4:59 AM

## 2021-04-01 NOTE — Progress Notes (Signed)
Subjective: Overnight events: Patient was transferred to ICU after sustained hypotension occurred a few hours after surgery around midnight.  Patient was evaluated around 1300. She was off pressors and was responsive and alert but not oriented. She did not appear to be in any pain.  Objective:   VITALS:   Vitals:   04/01/21 1700 04/01/21 1800 04/01/21 1900 04/01/21 2000  BP: (!) 99/54 (!) 97/53 (!) 108/54 91/60  Pulse: 96 86 93 98  Resp: (!) 23 (!) 22 (!) 22 (!) 21  Temp:    (!) 97.5 F (36.4 C)  TempSrc:    Oral  SpO2: 90% 99% 97% 96%  Weight:      Height:        PHYSICAL EXAM:  Gen: alert, not oriented RLE: dressing clean, dry, intact Able to wiggle toes and plantarflex/dorsiflex ankle Cap refill < 2 sec  LABS  Results for orders placed or performed during the hospital encounter of 03/30/21 (from the past 24 hour(s))  CBC with Differential/Platelet     Status: Abnormal   Collection Time: 04/01/21  1:19 AM  Result Value Ref Range   WBC 18.2 (H) 4.0 - 10.5 K/uL   RBC 3.23 (L) 3.87 - 5.11 MIL/uL   Hemoglobin 9.6 (L) 12.0 - 15.0 g/dL   HCT 30.5 (L) 36.0 - 46.0 %   MCV 94.4 80.0 - 100.0 fL   MCH 29.7 26.0 - 34.0 pg   MCHC 31.5 30.0 - 36.0 g/dL   RDW 16.2 (H) 11.5 - 15.5 %   Platelets 121 (L) 150 - 400 K/uL   nRBC 0.0 0.0 - 0.2 %   Neutrophils Relative % 71 %   Neutro Abs 13.2 (H) 1.7 - 7.7 K/uL   Lymphocytes Relative 13 %   Lymphs Abs 2.3 0.7 - 4.0 K/uL   Monocytes Relative 9 %   Monocytes Absolute 1.6 (H) 0.1 - 1.0 K/uL   Eosinophils Relative 5 %   Eosinophils Absolute 0.8 (H) 0.0 - 0.5 K/uL   Basophils Relative 1 %   Basophils Absolute 0.1 0.0 - 0.1 K/uL   Immature Granulocytes 1 %   Abs Immature Granulocytes 0.14 (H) 0.00 - 0.07 K/uL  Basic metabolic panel     Status: Abnormal   Collection Time: 04/01/21  1:19 AM  Result Value Ref Range   Sodium 137 135 - 145 mmol/L   Potassium 4.1 3.5 - 5.1 mmol/L   Chloride 104 98 - 111 mmol/L   CO2 27 22 - 32 mmol/L    Glucose, Bld 75 70 - 99 mg/dL   BUN 23 8 - 23 mg/dL   Creatinine, Ser 0.87 0.44 - 1.00 mg/dL   Calcium 7.9 (L) 8.9 - 10.3 mg/dL   GFR, Estimated >60 >60 mL/min   Anion gap 6 5 - 15  Glucose, capillary     Status: Abnormal   Collection Time: 04/01/21  1:30 AM  Result Value Ref Range   Glucose-Capillary 65 (L) 70 - 99 mg/dL  Glucose, capillary     Status: Abnormal   Collection Time: 04/01/21  2:05 AM  Result Value Ref Range   Glucose-Capillary 145 (H) 70 - 99 mg/dL  Procalcitonin - Baseline     Status: None   Collection Time: 04/01/21  4:05 AM  Result Value Ref Range   Procalcitonin <0.10 ng/mL  Glucose, capillary     Status: None   Collection Time: 04/01/21  4:16 AM  Result Value Ref Range   Glucose-Capillary 99 70 - 99  mg/dL  Lactic acid, plasma     Status: None   Collection Time: 04/01/21  4:23 AM  Result Value Ref Range   Lactic Acid, Venous 1.2 0.5 - 1.9 mmol/L  Blood gas, venous     Status: Abnormal (Preliminary result)   Collection Time: 04/01/21  4:24 AM  Result Value Ref Range   FIO2 PENDING    pH, Ven 7.23 (L) 7.250 - 7.430   pCO2, Ven 66 (H) 44.0 - 60.0 mmHg   pO2, Ven 36.0 32.0 - 45.0 mmHg   Bicarbonate 27.6 20.0 - 28.0 mmol/L   Acid-base deficit 0.9 0.0 - 2.0 mmol/L   O2 Saturation 56.5 %   Patient temperature 37.0    Collection site VENOUS    Sample type PENDING    Mechanical Rate PENDING   Urinalysis, Complete w Microscopic Urine, Random     Status: Abnormal   Collection Time: 04/01/21  4:30 AM  Result Value Ref Range   Color, Urine YELLOW (A) YELLOW   APPearance HAZY (A) CLEAR   Specific Gravity, Urine 1.023 1.005 - 1.030   pH 5.0 5.0 - 8.0   Glucose, UA 50 (A) NEGATIVE mg/dL   Hgb urine dipstick SMALL (A) NEGATIVE   Bilirubin Urine NEGATIVE NEGATIVE   Ketones, ur 5 (A) NEGATIVE mg/dL   Protein, ur NEGATIVE NEGATIVE mg/dL   Nitrite NEGATIVE NEGATIVE   Leukocytes,Ua TRACE (A) NEGATIVE   RBC / HPF 6-10 0 - 5 RBC/hpf   WBC, UA 6-10 0 - 5 WBC/hpf    Bacteria, UA NONE SEEN NONE SEEN   Squamous Epithelial / LPF 0-5 0 - 5   Mucus PRESENT    Granular Casts, UA PRESENT   Glucose, capillary     Status: Abnormal   Collection Time: 04/01/21  6:00 AM  Result Value Ref Range   Glucose-Capillary 115 (H) 70 - 99 mg/dL  Hemoglobin and hematocrit, blood     Status: Abnormal   Collection Time: 04/01/21  6:58 AM  Result Value Ref Range   Hemoglobin 9.7 (L) 12.0 - 15.0 g/dL   HCT 30.2 (L) 36.0 - 46.0 %  Lactic acid, plasma     Status: None   Collection Time: 04/01/21  6:58 AM  Result Value Ref Range   Lactic Acid, Venous 1.2 0.5 - 1.9 mmol/L  Glucose, capillary     Status: None   Collection Time: 04/01/21  7:11 AM  Result Value Ref Range   Glucose-Capillary 85 70 - 99 mg/dL  Blood gas, arterial     Status: Abnormal   Collection Time: 04/01/21  8:00 AM  Result Value Ref Range   FIO2 0.28    Delivery systems NASAL CANNULA    pH, Arterial 7.26 (L) 7.350 - 7.450   pCO2 arterial 56 (H) 32.0 - 48.0 mmHg   pO2, Arterial 87 83.0 - 108.0 mmHg   Bicarbonate 25.1 20.0 - 28.0 mmol/L   Acid-base deficit 2.5 (H) 0.0 - 2.0 mmol/L   O2 Saturation 95.1 %   Patient temperature 37.0    Collection site RIGHT BRACHIAL    Sample type ARTERIAL DRAW   Glucose, capillary     Status: None   Collection Time: 04/01/21  9:31 AM  Result Value Ref Range   Glucose-Capillary 73 70 - 99 mg/dL  Glucose, capillary     Status: None   Collection Time: 04/01/21 11:51 AM  Result Value Ref Range   Glucose-Capillary 81 70 - 99 mg/dL  Hemoglobin and hematocrit, blood  Status: Abnormal   Collection Time: 04/01/21  1:01 PM  Result Value Ref Range   Hemoglobin 9.6 (L) 12.0 - 15.0 g/dL   HCT 29.9 (L) 36.0 - 46.0 %  Glucose, capillary     Status: Abnormal   Collection Time: 04/01/21  2:53 PM  Result Value Ref Range   Glucose-Capillary 114 (H) 70 - 99 mg/dL  Glucose, capillary     Status: Abnormal   Collection Time: 04/01/21  4:02 PM  Result Value Ref Range    Glucose-Capillary 126 (H) 70 - 99 mg/dL  Hemoglobin and hematocrit, blood     Status: Abnormal   Collection Time: 04/01/21  6:52 PM  Result Value Ref Range   Hemoglobin 9.7 (L) 12.0 - 15.0 g/dL   HCT 30.5 (L) 36.0 - 46.0 %    DG Chest 1 View  Result Date: 04/01/2021 CLINICAL DATA:  Sepsis EXAM: CHEST  1 VIEW COMPARISON:  03/30/2021 FINDINGS: Pulmonary interstitial prominence is likely artifactual in nature in related to radiographic technique given its development since prior examination. No focal pulmonary infiltrate. No pneumothorax or pleural effusion. Cardiac size is within normal limits. Pulmonary vascularity is normal. No acute bone abnormality. IMPRESSION: No active disease. Electronically Signed   By: Fidela Salisbury MD   On: 04/01/2021 05:17   DG Pelvis Portable  Result Date: 03/31/2021 CLINICAL DATA:  Postop. EXAM: PORTABLE PELVIS 1-2 VIEWS COMPARISON:  Preoperative imaging yesterday. FINDINGS: Right hip arthroplasty in expected alignment. The distal most femoral stem is not entirely included in the field of view. There is no periprosthetic lucency or fracture. Recent postsurgical change includes air and edema in the joint space and soft tissues. Lateral skin staples. IMPRESSION: Right hip arthroplasty without immediate postoperative complication. Electronically Signed   By: Keith Rake M.D.   On: 03/31/2021 19:52    Assessment/Plan: 1 Day Post-Op s/p R hip hemi. Transferred to ICU overnight after sustained hypotension, weaned off pressors this afternoon, plan for transfer back to floor once stable.  Principal Problem:   Closed displaced fracture of right femoral neck (HCC) Active Problems:   Benign essential HTN   OP (osteoporosis)   Paroxysmal atrial fibrillation (HCC)   Psoriasis with arthropathy ([place])   Leukocytosis   Appreciate ICU and hospitalist team support for medical management PT/OT: WBAT RLE, posterior hip precautions Dressing: leave aquacell in place unless  saturated    Renee Harder , MD 04/01/2021, 8:38 PM

## 2021-04-01 NOTE — Progress Notes (Signed)
BRIEF CRITICAL CARE NOTE  Brief Pt Description 87 year old woman with PMH below presenting from SNF after witnessed fall with displaced R femoral hip fx.  Underwent fixation at 7PM yesterday evening.  Seems to have been hypotensive ever since returning from OR despite fluids and trial of midodrine.  PCCM consulted for postop hypotension as patient now started on pressors.   S: Unable/Difficult to obtain ROS due to altered mental status Pt does deny chest pain, SOB, abdominal pain, generalized pain  O: Vitals: Temp 97.7, RR 16, Pulse 87, BP 102/60 (74) on 1 mcg Levophed, 98% SpO2 on 2L [place] Physical Exam General: cachetic elderly woman lying in bed, on 2L [place], in no acute distress HENT: Atraumatic, normocephalic, neck supple, no JVD Lungs: Clear to auscultation bilaterally, even, nonlabored Cardiovascular: Irregularly irregular rhythm (rate controlled) Abdomen: soft, nontender, nondistended, no guarding or rebound tenderness, BS+ x4 Extremities: surgical site appears soft, no obvious bruising or swelling, no edema Neuro: Sleeping, arouses to voice, follows commands, moves all 4 ext  GU: foley in place with clear urine  A/P: Post-op Hypotension -Continuous cardiac monitoring -Maintain MAP >65 -IV fluids -Vasopressors as needed ~ currently weaned down to 1 mcg Levophed -Will add Midodrine -Hold home antihypertensives -Lactic acid normal (1.2>1.2) -Monitor for S/Sx of bleeding -Trend CBC (Hgb stable since arrival to ICU 9.6 ~ 9.7)  -Transfuse for Hgb <8 -Monitor fever curve and Trend WBC's -Procalcitonin is negative -Continue Cefazolin for Surgical prophylaxis -CXR is clear -UA with trace leukocytes, NO BACTERIA, and 6-10 WBC ~ will add on Urine culture    PLEASE SEE FULL CONSULT NOTE BY DR. Tamala Julian EARLIER THIS MORNING FOR FULL ASSESSMENT AND PLAN     Darel Hong, AGACNP-BC [facility] Prefer epic messenger for cross cover needs If after  hours, please call E-link

## 2021-04-02 ENCOUNTER — Encounter: Payer: Self-pay | Admitting: Internal Medicine

## 2021-04-02 DIAGNOSIS — Z515 Encounter for palliative care: Secondary | ICD-10-CM

## 2021-04-02 DIAGNOSIS — Z9889 Other specified postprocedural states: Secondary | ICD-10-CM

## 2021-04-02 LAB — CBC
HCT: 28.2 % — ABNORMAL LOW (ref 36.0–46.0)
Hemoglobin: 8.9 g/dL — ABNORMAL LOW (ref 12.0–15.0)
MCH: 29.2 pg (ref 26.0–34.0)
MCHC: 31.6 g/dL (ref 30.0–36.0)
MCV: 92.5 fL (ref 80.0–100.0)
Platelets: 144 10*3/uL — ABNORMAL LOW (ref 150–400)
RBC: 3.05 MIL/uL — ABNORMAL LOW (ref 3.87–5.11)
RDW: 16.5 % — ABNORMAL HIGH (ref 11.5–15.5)
WBC: 12.1 10*3/uL — ABNORMAL HIGH (ref 4.0–10.5)
nRBC: 0 % (ref 0.0–0.2)

## 2021-04-02 LAB — PROCALCITONIN: Procalcitonin: <0.1 ng/mL

## 2021-04-02 LAB — BASIC METABOLIC PANEL
Anion gap: 5 (ref 5–15)
BUN: 22 mg/dL (ref 8–23)
CO2: 27 mmol/L (ref 22–32)
Calcium: 7.8 mg/dL — ABNORMAL LOW (ref 8.9–10.3)
Chloride: 107 mmol/L (ref 98–111)
Creatinine, Ser: 0.74 mg/dL (ref 0.44–1.00)
GFR, Estimated: >60 mL/min (ref >60)
Glucose, Bld: 99 mg/dL (ref 70–99)
Potassium: 4.3 mmol/L (ref 3.5–5.1)
Sodium: 139 mmol/L (ref 135–145)

## 2021-04-02 LAB — GLUCOSE, CAPILLARY
Glucose-Capillary: 100 mg/dL — ABNORMAL HIGH (ref 70–99)
Glucose-Capillary: 117 mg/dL — ABNORMAL HIGH (ref 70–99)
Glucose-Capillary: 118 mg/dL — ABNORMAL HIGH (ref 70–99)
Glucose-Capillary: 129 mg/dL — ABNORMAL HIGH (ref 70–99)
Glucose-Capillary: 87 mg/dL (ref 70–99)
Glucose-Capillary: 94 mg/dL (ref 70–99)

## 2021-04-02 LAB — URINE CULTURE: Culture: NO GROWTH

## 2021-04-02 MED ORDER — DOCUSATE SODIUM 50 MG/5ML PO LIQD
100.0000 mg | Freq: Two times a day (BID) | ORAL | Status: DC
  Administered 2021-04-03 – 2021-04-06 (×8): 100 mg via ORAL
  Filled 2021-04-02 (×10): qty 10

## 2021-04-02 NOTE — NC FL2 (Signed)
[place] LEVEL OF CARE SCREENING TOOL     IDENTIFICATION  Patient Name: Victoria Esparza Birthdate: 11/13/1928 Sex: female Admission Date (Current Location): 03/30/2021  [place] and [place] Number:  Engineering geologist and Address:  [facility], [address] [place], [place] [address]      Provider Number: [id number]  Attending Physician Name and Address:  Jennye Boroughs, MD  Relative Name and Phone Number:  Otho Perl [phone]    Current Level of Care: Hospital Recommended Level of Care: [place] Prior Approval Number:    Date Approved/Denied:   PASRR Number: [id number]  Discharge Plan: SNF    Current Diagnoses: Patient Active Problem List   Diagnosis Date Noted  . Closed displaced fracture of right femoral neck ([place]) 03/30/2021  . Leukocytosis 03/30/2021  . Cognitive impairment 06/01/2019  . Psoriasis with arthropathy ([place]) 01/05/2016  . Benign essential HTN 09/08/2015  . Paroxysmal atrial fibrillation ([place]) 08/31/2015  . OP (osteoporosis) 04/20/2015    Orientation RESPIRATION BLADDER Height & Weight     Self  O2 (2 liters) Incontinent Weight: 59.2 kg Height:  5\' 4"  (162.6 cm)  BEHAVIORAL SYMPTOMS/MOOD NEUROLOGICAL BOWEL NUTRITION STATUS      Continent Diet (regular diet thin liquid)  AMBULATORY STATUS COMMUNICATION OF NEEDS Skin   Extensive Assist Verbally Surgical wounds                       Personal Care Assistance Level of Assistance  Bathing,Dressing Bathing Assistance: Maximum assistance   Dressing Assistance: Maximum assistance     Functional Limitations Info  Hearing   Hearing Info: Impaired      SPECIAL CARE FACTORS FREQUENCY  PT (By licensed PT)     PT Frequency: 5 times per week              Contractures Contractures Info: Not present    Additional Factors Info  Code Status,Allergies Code Status Info: full Allergies Info: NKDA           Current  Medications (04/02/2021):  This is the current hospital active medication list Current Facility-Administered Medications  Medication Dose Route Frequency Provider Last Rate Last Admin  . 0.9 %  sodium chloride infusion  250 mL Intravenous Continuous Jennye Boroughs, MD      . albuterol (VENTOLIN HFA) 108 (90 Base) MCG/ACT inhaler 2 puff  2 puff Inhalation Q6H PRN Jennye Boroughs, MD      . aspirin EC tablet 325 mg  325 mg Oral Q breakfast Jennye Boroughs, MD   325 mg at 04/02/21 0810  . calcium-vitamin D (OSCAL WITH D) 500-200 MG-UNIT per tablet 1 tablet  1 tablet Oral Daily Jennye Boroughs, MD   1 tablet at 04/02/21 1000  . Chlorhexidine Gluconate Cloth 2 % PADS 6 each  6 each Topical Q0600 Jennye Boroughs, MD   6 each at 04/01/21 0518  . citalopram (CELEXA) tablet 10 mg  10 mg Oral Daily Jennye Boroughs, MD   10 mg at 04/02/21 1001  . docusate (COLACE) 50 MG/5ML liquid 100 mg  100 mg Oral BID Jennye Boroughs, MD      . feeding supplement (ENSURE ENLIVE / ENSURE PLUS) liquid 237 mL  237 mL Oral BID BM Jennye Boroughs, MD   237 mL at 04/02/21 1001  . melatonin tablet 5 mg  5 mg Oral QHS PRN Jennye Boroughs, MD      . menthol-cetylpyridinium (CEPACOL) lozenge 3 mg  1  lozenge Oral PRN Jennye Boroughs, MD       Or  . phenol (CHLORASEPTIC) mouth spray 1 spray  1 spray Mouth/Throat PRN Jennye Boroughs, MD      . midodrine (PROAMATINE) tablet 10 mg  10 mg Oral TID WC Jennye Boroughs, MD   10 mg at 04/02/21 0809  . mirtazapine (REMERON) tablet 15 mg  15 mg Oral QHS Jennye Boroughs, MD   15 mg at 04/01/21 2121  . multivitamin with minerals tablet 1 tablet  1 tablet Oral Daily Jennye Boroughs, MD   1 tablet at 04/02/21 1000  . norepinephrine (LEVOPHED) 4mg  in 262mL premix infusion  2-10 mcg/min Intravenous Titrated Jennye Boroughs, MD   Stopped at 04/01/21 1004  . ondansetron (ZOFRAN) tablet 4 mg  4 mg Oral Q6H PRN Renee Harder, MD       Or  . ondansetron [facility]) injection 4 mg  4 mg Intravenous Q6H PRN Renee Harder, MD      . polyethylene glycol (MIRALAX / GLYCOLAX) packet 17 g  17 g Oral Daily PRN Jennye Boroughs, MD      . senna-docusate (Senokot-S) tablet 2 tablet  2 tablet Oral BID Jennye Boroughs, MD   2 tablet at 04/02/21 1001  . sodium chloride (OCEAN) 0.65 % nasal spray 1 spray  1 spray Each Nare PRN Jennye Boroughs, MD         Discharge Medications: Please see discharge summary for a list of discharge medications.  Relevant Imaging Results:  Relevant Lab Results:   Additional Information SS# [id number]  Su Hilt, RN

## 2021-04-02 NOTE — Progress Notes (Signed)
BRIEF CRITICAL CARE NOTE  Brief Pt Description 87 year old woman with PMH below presenting from SNF after witnessed fall with displaced R femoral hip fx.  Underwent fixation at 7PM yesterday evening.  Seems to have been hypotensive ever since returning from OR despite fluids and trial of midodrine.  PCCM consulted for postop hypotension as patient now started on pressors.   Post-op Hypotension has resolved, and Pt has been weaned off Vaospressors since yesterday morning 04/01/21, currently hemodynamically stable.  PCCM will sign off at this time, please reconsult if any critical care needs arise or if we can be of further assistance.     Darel Hong, AGACNP-BC [facility] Prefer epic messenger for cross cover needs If after hours, please call E-link

## 2021-04-02 NOTE — TOC Progression Note (Signed)
Transition of Care [facility]) - Progression Note    Patient Details  Name: Victoria Esparza MRN: [id number] Date of Birth: 09-10-1928  Transition of Care [facility]) CM/SW [place], RN Phone Number: 04/02/2021, 1:42 PM  Clinical Narrative:    Called the patient's daughter [name] at [phone], Left a voice mail requesting a call back,  The patient resides at [facility] assisted living and is alert to self only, PASSR number obtained [id number], FL2 completed and bedsearch sent, will need to contact the daughter to review bed offers once obtained        Expected Discharge Plan and Services                                                 Social Determinants of Health (SDOH) Interventions    Readmission Risk Interventions No flowsheet data found.

## 2021-04-02 NOTE — Progress Notes (Signed)
[facility]) Hospital Liaison RN Note:   Received new referral for [facility] out patient palliative program to follow post discharge from Quinn Axe, NP. Patient information given to referral. Hospital care team is aware. Plan is to discharge to SNF. [place] Liaison will continue to follow for disposition.  Thank you for this referral.  Zandra Abts, RN [facility] Liaison [phone]

## 2021-04-02 NOTE — Progress Notes (Signed)
Palliative: Multiple attempts to reach Mrs. Ventresca daughter by inpatient palliative medicine team.  No return phone calls.  Mrs. Prohaska is improving and being worked up for short-term rehab.  Outpatient palliative services to follow.  Conference with attending, transition of care team, local hospice representative related to outpatient palliative services.  Plan: At this point continue to treat the treatable.  Short-term rehab.  Anticipate return to ALF, [facility], upon completion of short-term rehab.  Outpatient palliative services to follow for goals of care discussions/CODE STATUS discussions.  No charge Quinn Axe, NP Palliative medicine team Greater than 50% of this time was spent counseling and coordinating care related to the above assessment and plan.

## 2021-04-02 NOTE — Progress Notes (Signed)
   04/01/21 0010  Assess: MEWS Score  Temp (!) 97.3 F (36.3 C)  BP (!) 75/45  Pulse Rate 61  Resp 16  SpO2 95 %  O2 Device Nasal Cannula  O2 Flow Rate (L/min) 2 L/min  Assess: MEWS Score  MEWS Temp 0  MEWS Systolic 2  MEWS Pulse 0  MEWS RR 0  MEWS LOC 0  MEWS Score 2  MEWS Score Color Yellow  Assess: if the MEWS score is Yellow or Red  Were vital signs taken at a resting state? Yes  Focused Assessment Change from prior assessment (see assessment flowsheet)  Early Detection of Sepsis Score *See Row Information* Medium  MEWS guidelines implemented *See Row Information* Yes  Treat  MEWS Interventions Escalated (See documentation below)  Pain Scale 0-10  Pain Score 0  Breathing 0  Negative Vocalization 0  Facial Expression 0  Body Language 0  Consolability 0  PAINAD Score 0  Take Vital Signs  Increase Vital Sign Frequency  Yellow: Q 2hr X 2 then Q 4hr X 2, if remains yellow, continue Q 4hrs  Escalate  MEWS: Escalate Yellow: discuss with charge nurse/RN and consider discussing with provider and RRT  Notify: Charge Nurse/RN  Name of Charge Nurse/RN Notified Miguel Dibble, RN  Date Charge Nurse/RN Notified 04/01/21  Time Charge Nurse/RN Notified 0010  Notify: Provider  Provider Name/Title Eugenie Norrie  Date Provider Notified 04/01/21  Time Provider Notified 0015  Notification Type Page  Notification Reason Change in status  Provider response See new orders  Date of Provider Response 04/01/21  Time of Provider Response 0015  Notify: Rapid Response  Name of Rapid Response RN Notified Madelon Lips, RN  Date Rapid Response Notified 04/02/21  Time Rapid Response Notified 0040  Document  Patient Outcome Transferred/level of care increased  Inserted for Michail Jewels LPN

## 2021-04-02 NOTE — Progress Notes (Addendum)
Progress Note    Victoria Esparza  EYC:[id number] DOB: 1928-09-27  DOA: 03/30/2021 PCP: Orvis Brill, [facility]      Brief Narrative:    Medical records reviewed and are as summarized below:  Victoria Esparza is a 87 y.o. female       Assessment/Plan:   Principal Problem:   Closed displaced fracture of right femoral neck ([place]) Active Problems:   Benign essential HTN   OP (osteoporosis)   Paroxysmal atrial fibrillation (HCC)   Psoriasis with arthropathy (HCC)   Leukocytosis    Body mass index is 22.4 kg/m.   Displaced, closed proximal right femoral neck fracture: s/p right bipolar hip hemiarthroplasty on 03/31/2021.  Analgesics as needed for pain.  Follow-up with orthopedic surgeon.  Postoperative hypotension: Resolved.  She is off of vasopressors.  Acute hypoxic and hypercapnic respiratory failure: Continue oxygen 2L/min.    Acute blood loss anemia: No indication for blood transfusion at this time.  Monitor H&H.  History of hypertension, paroxysmal atrial fibrillation: Hold metoprolol  Psoriasis with arthropathy: She is on weekly Enbrel and methotrexate  Depression, mild cognitive impairment: Continue psychotropics  COPD: Stable.  Bronchodilators as needed.  I called her daughter's Victoria Esparza) phone at [phone] but there was no response.  I left a voice message for her to call back.   Diet Order            Diet regular Room service appropriate? Yes; Fluid consistency: Thin  Diet effective now                    Consultants:  Orthopedic surgeon  Procedures:  Right bipolar hip hemiarthroplasty on 03/31/2021    Medications:   . aspirin EC  325 mg Oral Q breakfast  . calcium-vitamin D  1 tablet Oral Daily  . Chlorhexidine Gluconate Cloth  6 each Topical Q0600  . citalopram  10 mg Oral Daily  . docusate  100 mg Oral BID  . feeding supplement  237 mL Oral BID BM  . midodrine  10 mg Oral TID WC  . mirtazapine  15 mg Oral QHS  .  multivitamin with minerals  1 tablet Oral Daily  . senna-docusate  2 tablet Oral BID   Continuous Infusions: . sodium chloride    . norepinephrine (LEVOPHED) Adult infusion Stopped (04/01/21 1004)     Anti-infectives (From admission, onward)   Start     Dose/Rate Route Frequency Ordered Stop   04/01/21 0200  ceFAZolin (ANCEF) IVPB 1 g/50 mL premix        1 g 100 mL/hr over 30 Minutes Intravenous Every 8 hours 04/01/21 0134 04/01/21 1358   03/31/21 1842  vancomycin (VANCOCIN) powder  Status:  Discontinued          As needed 03/31/21 1842 03/31/21 1907   03/31/21 1615  ceFAZolin (ANCEF) IVPB 2g/100 mL premix        2 g 200 mL/hr over 30 Minutes Intravenous  Once 03/31/21 1614 03/31/21 1710   03/31/21 1608  ceFAZolin (ANCEF) 2-4 GM/100ML-% IVPB       Note to Pharmacy: Trudie Reed   : cabinet override      03/31/21 1608 03/31/21 1702             Family Communication/Anticipated D/C date and plan/Code Status   DVT prophylaxis: SCDs Start: 03/31/21 2042 SCDs Start: 03/30/21 2055     Code Status: Full Code  Family Communication: None Disposition Plan:    Status  is: Inpatient  Remains inpatient appropriate because:Inpatient level of care appropriate due to severity of illness   Dispo: The patient is from: SNF              Anticipated d/c is to: SNF              Patient currently is not medically stable to d/c.   Difficult to place patient No           Subjective:   Interval events noted.  She has no complaints.  Objective:    Vitals:   04/02/21 0700 04/02/21 0800 04/02/21 0900 04/02/21 1026  BP: (!) 106/92 (!) 113/57 100/76 100/70  Pulse: 92 98 93 83  Resp: (!) 29 20 (!) 21 16  Temp:  97.6 F (36.4 C)  98.4 F (36.9 C)  TempSrc:  Oral  Oral  SpO2: 100% (!) 88% 99% 98%  Weight:      Height:       No data found.   Intake/Output Summary (Last 24 hours) at 04/02/2021 1203 Last data filed at 04/02/2021 1000 Gross per 24 hour  Intake 1044.05 ml   Output 500 ml  Net 544.05 ml   Filed Weights   03/30/21 1644 04/01/21 0125  Weight: 64 kg 59.2 kg    Exam:  GEN: NAD SKIN: Warm and dry EYES: No pallor or icterus ENT: MMM CV: RRR PULM: CTA B ABD: soft, ND, NT, +BS CNS: AAO x 1 (person), non focal EXT: Mild right hip tenderness.  Dressing on right hip surgical wound is clean, dry and intact.          Data Reviewed:   I have personally reviewed following labs and imaging studies:  Labs: Labs show the following:   Basic Metabolic Panel: Recent Labs  Lab 03/30/21 1933 03/31/21 0449 04/01/21 0119 04/02/21 0423  NA 135 135 137 139  K 4.3 4.1 4.1 4.3  CL 99 100 104 107  CO2 27 28 27 27   GLUCOSE 95 91 75 99  BUN 17 17 23 22   CREATININE 0.84 1.05* 0.87 0.74  CALCIUM 9.2 8.5* 7.9* 7.8*   GFR Estimated Creatinine Clearance: 40.4 mL/min (by C-G formula based on SCr of 0.74 mg/dL). Liver Function Tests: Recent Labs  Lab 03/30/21 1933  AST 22  ALT 13  ALKPHOS 61  BILITOT 0.6  PROT 6.8  ALBUMIN 3.3*   No results for input(s): LIPASE, AMYLASE in the last 168 hours. No results for input(s): AMMONIA in the last 168 hours. Coagulation profile No results for input(s): INR, PROTIME in the last 168 hours.  CBC: Recent Labs  Lab 03/30/21 1933 03/31/21 0449 04/01/21 0119 04/01/21 0658 04/01/21 1301 04/01/21 1852 04/02/21 0423  WBC 20.7* 19.0* 18.2*  --   --   --  12.1*  NEUTROABS 16.7*  --  13.2*  --   --   --   --   HGB 13.0 11.8* 9.6* 9.7* 9.6* 9.7* 8.9*  HCT 40.1 36.2 30.5* 30.2* 29.9* 30.5* 28.2*  MCV 90.9 90.7 94.4  --   --   --  92.5  PLT 161 149* 121*  --   --   --  144*   Cardiac Enzymes: No results for input(s): CKTOTAL, CKMB, CKMBINDEX, TROPONINI in the last 168 hours. BNP (last 3 results) No results for input(s): PROBNP in the last 8760 hours. CBG: Recent Labs  Lab 04/01/21 2119 04/02/21 0052 04/02/21 0334 04/02/21 0726 04/02/21 1200  GLUCAP 95 100* 87 94 129*  D-Dimer: No  results for input(s): DDIMER in the last 72 hours. Hgb A1c: No results for input(s): HGBA1C in the last 72 hours. Lipid Profile: No results for input(s): CHOL, HDL, LDLCALC, TRIG, CHOLHDL, LDLDIRECT in the last 72 hours. Thyroid function studies: No results for input(s): TSH, T4TOTAL, T3FREE, THYROIDAB in the last 72 hours.  Invalid input(s): FREET3 Anemia work up: No results for input(s): VITAMINB12, FOLATE, FERRITIN, TIBC, IRON, RETICCTPCT in the last 72 hours. Sepsis Labs: Recent Labs  Lab 03/30/21 1933 03/31/21 0449 04/01/21 0119 04/01/21 0405 04/01/21 0423 04/01/21 0658 04/02/21 0423  PROCALCITON  --   --   --  <0.10  --   --  <0.10  WBC 20.7* 19.0* 18.2*  --   --   --  12.1*  LATICACIDVEN  --   --   --   --  1.2 1.2  --     Microbiology Recent Results (from the past 240 hour(s))  Resp Panel by RT-PCR (Flu A&B, Covid) Nasopharyngeal Swab     Status: None   Collection Time: 03/30/21  8:06 PM   Specimen: Nasopharyngeal Swab; Nasopharyngeal(NP) swabs in vial transport medium  Result Value Ref Range Status   SARS Coronavirus 2 by RT PCR NEGATIVE NEGATIVE Final    Comment: (NOTE) SARS-CoV-2 target nucleic acids are NOT DETECTED.  The SARS-CoV-2 RNA is generally detectable in upper respiratory specimens during the acute phase of infection. The lowest concentration of SARS-CoV-2 viral copies this assay can detect is 138 copies/mL. A negative result does not preclude SARS-Cov-2 infection and should not be used as the sole basis for treatment or other patient management decisions. A negative result may occur with  improper specimen collection/handling, submission of specimen other than nasopharyngeal swab, presence of viral mutation(s) within the areas targeted by this assay, and inadequate number of viral copies(<138 copies/mL). A negative result must be combined with clinical observations, patient history, and epidemiological information. The expected result is  Negative.  Fact Sheet for Patients:  EntrepreneurPulse.com.au  Fact Sheet for Healthcare Providers:  IncredibleEmployment.be  This test is no t yet approved or cleared by the [place] FDA and  has been authorized for detection and/or diagnosis of SARS-CoV-2 by FDA under an Emergency Use Authorization (EUA). This EUA will remain  in effect (meaning this test can be used) for the duration of the COVID-19 declaration under Section 564(b)(1) of the Act, 21 U.S.C.section 360bbb-3(b)(1), unless the authorization is terminated  or revoked sooner.       Influenza A by PCR NEGATIVE NEGATIVE Final   Influenza B by PCR NEGATIVE NEGATIVE Final    Comment: (NOTE) The Xpert Xpress SARS-CoV-2/FLU/RSV plus assay is intended as an aid in the diagnosis of influenza from Nasopharyngeal swab specimens and should not be used as a sole basis for treatment. Nasal washings and aspirates are unacceptable for Xpert Xpress SARS-CoV-2/FLU/RSV testing.  Fact Sheet for Patients: EntrepreneurPulse.com.au  Fact Sheet for Healthcare Providers: IncredibleEmployment.be  This test is not yet approved or cleared by the [place] FDA and has been authorized for detection and/or diagnosis of SARS-CoV-2 by FDA under an Emergency Use Authorization (EUA). This EUA will remain in effect (meaning this test can be used) for the duration of the COVID-19 declaration under Section 564(b)(1) of the Act, 21 U.S.C. section 360bbb-3(b)(1), unless the authorization is terminated or revoked.  Performed at [facility], [address] [place], [place] [address]   Urine Culture     Status: None   Collection  Time: 04/01/21  4:30 AM   Specimen: Urine, Random  Result Value Ref Range Status   Specimen Description   Final    URINE, RANDOM Performed at [facility], [address], [place], [place] [address]    Special Requests    Final    NONE Performed at [facility], [address] [place], [place] [address]    Culture   Final    NO GROWTH Performed at [facility], [place] [address], [place], [place] [address]    Report Status 04/02/2021 FINAL  Final    Procedures and diagnostic studies:  DG Chest 1 View  Result Date: 04/01/2021 CLINICAL DATA:  Sepsis EXAM: CHEST  1 VIEW COMPARISON:  03/30/2021 FINDINGS: Pulmonary interstitial prominence is likely artifactual in nature in related to radiographic technique given its development since prior examination. No focal pulmonary infiltrate. No pneumothorax or pleural effusion. Cardiac size is within normal limits. Pulmonary vascularity is normal. No acute bone abnormality. IMPRESSION: No active disease. Electronically Signed   By: Fidela Salisbury MD   On: 04/01/2021 05:17   DG Pelvis Portable  Result Date: 03/31/2021 CLINICAL DATA:  Postop. EXAM: PORTABLE PELVIS 1-2 VIEWS COMPARISON:  Preoperative imaging yesterday. FINDINGS: Right hip arthroplasty in expected alignment. The distal most femoral stem is not entirely included in the field of view. There is no periprosthetic lucency or fracture. Recent postsurgical change includes air and edema in the joint space and soft tissues. Lateral skin staples. IMPRESSION: Right hip arthroplasty without immediate postoperative complication. Electronically Signed   By: Keith Rake M.D.   On: 03/31/2021 19:52               LOS: 3 days   Ilani Otterson  [facility]   Pager on [email or web address]. If 7PM-7AM, please contact night-coverage at [email or web address]     04/02/2021, 12:03 PM

## 2021-04-02 NOTE — Progress Notes (Addendum)
Physical Therapy Treatment Patient Details Name: Victoria Esparza MRN: [id number] DOB: [birth year] Today's Date: 04/02/2021    History of Present Illness 87 y/o female s/p R hip fx with hemi arthroplasty repair 4/13.  Post surgery she had issues with sustained hypotension and was placed in CCU.    PT Comments    Pt seen for PT tx with pt remaining confused but very pleasant. Pt requires mod assist for supine>sit but max assist to scoot to sitting EOB. Pt completes sit<>stand & stand pivot with RW & MAX assist with multimodal cuing for sequencing turning & safety during mobility. Pt unaware of hip precautions, frequently crossing legs before PT is able to place hip abduction wedge despite ongoing cuing to not cross legs. Continue to recommend STR upon d/c to maximize independence with functional mobility, reduce fall risk, & decrease caregiver burden.  Addendum: Pt received in bed with nasal cannula below chin, SPO2 >90% & PT places cannula into nose with pt on 2L/min during session.    Follow Up Recommendations  SNF     Equipment Recommendations  None recommended by PT    Recommendations for Other Services       Precautions / Restrictions Precautions Precautions: Posterior Hip;Fall Restrictions Weight Bearing Restrictions: Yes RLE Weight Bearing: Weight bearing as tolerated    Mobility  Bed Mobility Overal bed mobility: Needs Assistance Bed Mobility: Supine to Sit     Supine to sit: Mod assist;HOB elevated     General bed mobility comments: cuing to use bed rails, mod assist for supine>sit but max assist to scoot to EOB    Transfers Overall transfer level: Needs assistance Equipment used: Rolling walker (2 wheeled) Transfers: Sit to/from [employer] Transfers Sit to Stand: Max assist;From elevated surface Stand pivot transfers: Max assist;From elevated surface       General transfer comment: max cuing for sequencing, PT provides assistance to safely lower to  sitting in recliner  Ambulation/Gait - addendum: unsafe to attempt gait without +2 assist                 Stairs             Wheelchair Mobility    Modified Rankin (Stroke Patients Only)       Balance Overall balance assessment: Needs assistance Sitting-balance support: Feet supported;Bilateral upper extremity supported   Sitting balance - Comments: supervision sitting EOB   Standing balance support: Bilateral upper extremity supported Standing balance-Leahy Scale: Poor Standing balance comment: max assist & BUE support on RW                            Cognition Arousal/Alertness: Awake/alert Behavior During Therapy: WFL for tasks assessed/performed Overall Cognitive Status: History of cognitive impairments - at baseline                                 General Comments: Pt oriented to self, more aware of situation revolving around hospital as she's asking to go to the hospital with PT educating her that she is already here; pt very appreciative of PT & nurse working with her      Exercises      General Comments        Pertinent Vitals/Pain Faces Pain Scale: Hurts a little bit Pain Location: RLE with movement Pain Descriptors / Indicators: Sore Pain Intervention(s): Limited activity within patient's tolerance;Monitored during  session    Home Living                      Prior Function            PT Goals (current goals can now be found in the care plan section) Acute Rehab PT Goals Patient Stated Goal: pt confused, none stated PT Goal Formulation: With patient Time For Goal Achievement: 04/15/21 Potential to Achieve Goals: Fair Progress towards PT goals: Progressing toward goals    Frequency    BID      PT Plan Current plan remains appropriate    Co-evaluation              AM-PAC PT "6 Clicks" Mobility   Outcome Measure  Help needed turning from your back to your side while in a flat bed  without using bedrails?: A Lot Help needed moving from lying on your back to sitting on the side of a flat bed without using bedrails?: Total Help needed moving to and from a bed to a chair (including a wheelchair)?: Total Help needed standing up from a chair using your arms (e.g., wheelchair or bedside chair)?: Total Help needed to walk in hospital room?: Total Help needed climbing 3-5 steps with a railing? : Total 6 Click Score: 7    End of Session   Activity Tolerance: Patient tolerated treatment well Patient left: in chair;with call bell/phone within reach;with chair alarm set;with SCD's reapplied (hip abduction wedge in place) Nurse Communication: Mobility status PT Visit Diagnosis: Muscle weakness (generalized) (M62.81);Difficulty in walking, not elsewhere classified (R26.2);Pain;Unsteadiness on feet (R26.81) Pain - Right/Left: Right Pain - part of body: Hip     Time: 1610-9604 PT Time Calculation (min) (ACUTE ONLY): 16 min  Charges:  $Therapeutic Activity: 8-22 mins                     Lavone Nian, PT, DPT 04/02/21, 2:10 PM    Waunita Schooner 04/02/2021, 2:06 PM

## 2021-04-02 NOTE — Progress Notes (Signed)
Physical Therapy Treatment Patient Details Name: Victoria Esparza MRN: [id number] DOB: 08-Sep-1928 Today's Date: 04/02/2021    History of Present Illness 87 y/o female s/p R hip fx with hemi arthroplasty repair 4/13.  Post surgery she had issues with sustained hypotension and was placed in CCU.    PT Comments    Pt seen for PT tx, with pt only oriented to self, unaware of situation nor hip precautions. Pt requires mod assist for bed mobility and max assist to attempt STS x 2 but pt unable to achieve full upright 2/2 c/o pain in RLE with weight bearing. Pt performs RLE strengthening exercises as noted below. Pt with head tremors with movement during session with pt reporting this is baseline. Pt also c/o dizziness with sitting EOB but BP WNL. Pt would benefit from STR upon d/c to maximize independence with functional mobility & reduce fall risk.  Pt on 2L/min O2 via nasal cannula, SpO2 >90% throughout. BP LUE supine: 121/61 mmHg (MAP 79) BP LUE sitting EOB: 114/88 mmHg (MAP 92)    Follow Up Recommendations  SNF     Equipment Recommendations  None recommended by PT    Recommendations for Other Services       Precautions / Restrictions Precautions Precautions: Posterior Hip;Fall Restrictions Weight Bearing Restrictions: Yes RLE Weight Bearing: Weight bearing as tolerated    Mobility  Bed Mobility Overal bed mobility: Needs Assistance Bed Mobility: Supine to Sit;Sit to Supine     Supine to sit: Mod assist;HOB elevated Sit to supine: Mod assist;HOB elevated   General bed mobility comments: Cuing to use bed rails, max assistance to scoot to EOB  max assist +2 to scoot to [facility]  Transfers Overall transfer level: Needs assistance Equipment used: Rolling walker (2 wheeled) Transfers: Sit to/from Stand Sit to Stand: Max assist         General transfer comment: x2 attempts, max assist to power up but unable to reach full upright standing 2/2 pain with weight  bearing  Ambulation/Gait                 Stairs             Wheelchair Mobility    Modified Rankin (Stroke Patients Only)       Balance Overall balance assessment: Needs assistance Sitting-balance support: Feet supported;Bilateral upper extremity supported Sitting balance-Leahy Scale: Poor Sitting balance - Comments: close supervision<>min assist for static sitting EOB Postural control: Left lateral lean Standing balance support: Bilateral upper extremity supported Standing balance-Leahy Scale: Poor Standing balance comment: max assist & BUE support on RW                            Cognition Arousal/Alertness: Awake/alert Behavior During Therapy: Flat affect Overall Cognitive Status: History of cognitive impairments - at baseline                                 General Comments: Pt only oriented to self. PT attempts to orient pt to location & situation throughout the session.      Exercises Total Joint Exercises Short Arc Quad: AAROM;Strengthening;Right;10 reps;Supine Heel Slides: AAROM;Strengthening;Right;10 reps;Supine Hip ABduction/ADduction: AAROM;Strengthening;Right;10 reps;Supine (hip abduction slides x 10 reps, hip adduction pillow squeezes x 10 reps) Long Arc Quad: AROM;Strengthening;Right;10 reps;Seated    General Comments General comments (skin integrity, edema, etc.): pt c/o dizziness with sitting EOB but BP WNL  Pertinent Vitals/Pain Pain Assessment: Faces Faces Pain Scale: Hurts whole lot Pain Location: RLE with movement, more pain with STS Pain Descriptors / Indicators: Sore Pain Intervention(s): Monitored during session;Limited activity within patient's tolerance    Home Living                      Prior Function            PT Goals (current goals can now be found in the care plan section) Acute Rehab PT Goals Patient Stated Goal: pt confused, none stated PT Goal Formulation: With  patient Time For Goal Achievement: 04/15/21 Potential to Achieve Goals: Fair Progress towards PT goals: Progressing toward goals    Frequency    BID      PT Plan Current plan remains appropriate    Co-evaluation              AM-PAC PT "6 Clicks" Mobility   Outcome Measure  Help needed turning from your back to your side while in a flat bed without using bedrails?: A Lot Help needed moving from lying on your back to sitting on the side of a flat bed without using bedrails?: Total Help needed moving to and from a bed to a chair (including a wheelchair)?: Total Help needed standing up from a chair using your arms (e.g., wheelchair or bedside chair)?: Total Help needed to walk in hospital room?: Total Help needed climbing 3-5 steps with a railing? : Total 6 Click Score: 7    End of Session Equipment Utilized During Treatment: Gait belt;Oxygen Activity Tolerance: Patient tolerated treatment well;Patient limited by pain Patient left: in bed;with call bell/phone within reach;with bed alarm set;with SCD's reapplied (hip abduction wedge in place) Nurse Communication: Mobility status PT Visit Diagnosis: Muscle weakness (generalized) (M62.81);Difficulty in walking, not elsewhere classified (R26.2);Pain;Unsteadiness on feet (R26.81) Pain - Right/Left: Right Pain - part of body: Hip     Time: 0922-0945 PT Time Calculation (min) (ACUTE ONLY): 23 min  Charges:  $Therapeutic Exercise: 8-22 mins $Therapeutic Activity: 8-22 mins                     Victoria Esparza, PT, DPT 04/02/21, 10:08 AM    Victoria Esparza 04/02/2021, 10:06 AM

## 2021-04-03 LAB — CBC
HCT: 25.9 % — ABNORMAL LOW (ref 36.0–46.0)
Hemoglobin: 8.4 g/dL — ABNORMAL LOW (ref 12.0–15.0)
MCH: 29.6 pg (ref 26.0–34.0)
MCHC: 32.4 g/dL (ref 30.0–36.0)
MCV: 91.2 fL (ref 80.0–100.0)
Platelets: 162 10*3/uL (ref 150–400)
RBC: 2.84 MIL/uL — ABNORMAL LOW (ref 3.87–5.11)
RDW: 16.8 % — ABNORMAL HIGH (ref 11.5–15.5)
WBC: 11.3 10*3/uL — ABNORMAL HIGH (ref 4.0–10.5)
nRBC: 0 % (ref 0.0–0.2)

## 2021-04-03 LAB — GLUCOSE, CAPILLARY
Glucose-Capillary: 106 mg/dL — ABNORMAL HIGH (ref 70–99)
Glucose-Capillary: 117 mg/dL — ABNORMAL HIGH (ref 70–99)
Glucose-Capillary: 118 mg/dL — ABNORMAL HIGH (ref 70–99)
Glucose-Capillary: 90 mg/dL (ref 70–99)
Glucose-Capillary: 96 mg/dL (ref 70–99)
Glucose-Capillary: 97 mg/dL (ref 70–99)

## 2021-04-03 LAB — BASIC METABOLIC PANEL
Anion gap: 5 (ref 5–15)
BUN: 23 mg/dL (ref 8–23)
CO2: 27 mmol/L (ref 22–32)
Calcium: 8.3 mg/dL — ABNORMAL LOW (ref 8.9–10.3)
Chloride: 106 mmol/L (ref 98–111)
Creatinine, Ser: 0.56 mg/dL (ref 0.44–1.00)
GFR, Estimated: >60 mL/min (ref >60)
Glucose, Bld: 90 mg/dL (ref 70–99)
Potassium: 4.1 mmol/L (ref 3.5–5.1)
Sodium: 138 mmol/L (ref 135–145)

## 2021-04-03 LAB — PROCALCITONIN: Procalcitonin: <0.1 ng/mL

## 2021-04-03 MED ORDER — DEXTROSE-NACL 5-0.9 % IV SOLN: INTRAVENOUS | Status: DC

## 2021-04-03 NOTE — Progress Notes (Signed)
Physical Therapy Treatment Patient Details Name: Victoria Esparza MRN: [id number] DOB: 23-Mar-1928 Today's Date: 04/03/2021    History of Present Illness 87 y/o female s/p R hip fx with hemi arthroplasty repair 4/13.  Post surgery she had issues with sustained hypotension and was placed in CCU.    PT Comments    Pt seen for PT tx with pt slightly more lethargic as PT woke pt up upon arrival & pt with ongoing head tremors with movement. Pt performs RLE strengthening exercises as noted below. Pt only notes pain in RLE with supine>sit & transfers. Pt requires MAX assist for sit<>stand & stand pivot transfers with decreased ability to weight shift to allow increased ease of LLE foot advancement during transfers. Pt with absent safety awareness requiring MAX cuing & assist from PT to complete full pivot & safety with lowering to seat. Continue to recommend STR upon d/c to maximize independence with mobility & reduce fall risk.    Follow Up Recommendations  SNF     Equipment Recommendations  None recommended by PT    Recommendations for Other Services       Precautions / Restrictions Precautions Precautions: Posterior Hip;Fall Restrictions Weight Bearing Restrictions: Yes RLE Weight Bearing: Weight bearing as tolerated    Mobility  Bed Mobility Overal bed mobility: Needs Assistance Bed Mobility: Supine to Sit     Supine to sit: Max assist;HOB elevated     General bed mobility comments: cuing to move BLE to EOB, assistance to upright trunk, total assist to scoot to sitting EOB after sitting upright    Transfers Overall transfer level: Needs assistance Equipment used: Rolling walker (2 wheeled) Transfers: Sit to/from [employer] Transfers Sit to Stand: Max assist;From elevated surface Stand pivot transfers: Max assist;From elevated surface       General transfer comment: max cuing for hand placement with PT physically placing pt's hands on proper place on RW, decreased  weight shifting to R to allow increased ease of LLE foot advancement when performing stand pivot to R with RW  Ambulation/Gait                 Stairs             Wheelchair Mobility    Modified Rankin (Stroke Patients Only)       Balance Overall balance assessment: Needs assistance Sitting-balance support: Feet supported;Bilateral upper extremity supported Sitting balance-Leahy Scale: Fair Sitting balance - Comments: supervision sitting EOB     Standing balance-Leahy Scale: Zero Standing balance comment: max assist & BUE support on RW                            Cognition Arousal/Alertness: Lethargic   Overall Cognitive Status: History of cognitive impairments - at baseline                                 General Comments: Pt oriented to self & city but still unaware of situation, hospital locationm, time with PT attempting to orient pt throughout session      Exercises Total Joint Exercises Heel Slides: AAROM;Strengthening;Right;10 reps;Supine Hip ABduction/ADduction: AAROM;Strengthening;Right;10 reps;Supine (hip abduction slides x 10 + hip adduction pillow squeezes x 10) Long Arc Quad: AROM;Strengthening;Right;10 reps;Seated    General Comments General comments (skin integrity, edema, etc.): Pt on room air upon PT arrival with SpO2 >/= 90%, does drop to 87% on room  air after transfer, requires 2L/min to recover to >90% & pt left on 2L. HR 94 bpm at rest in bed, up to 113 bpm after transfer      Pertinent Vitals/Pain Pain Assessment: Faces Faces Pain Scale: Hurts little more Pain Location: RLE with movement Pain Descriptors / Indicators: Sore Pain Intervention(s): Limited activity within patient's tolerance;Monitored during session    Home Living                      Prior Function            PT Goals (current goals can now be found in the care plan section) Acute Rehab PT Goals Patient Stated Goal: pt confused,  none stated PT Goal Formulation: With patient Time For Goal Achievement: 04/15/21 Potential to Achieve Goals: Fair Progress towards PT goals: Progressing toward goals    Frequency    BID      PT Plan Current plan remains appropriate    Co-evaluation              AM-PAC PT "6 Clicks" Mobility   Outcome Measure  Help needed turning from your back to your side while in a flat bed without using bedrails?: A Lot Help needed moving from lying on your back to sitting on the side of a flat bed without using bedrails?: Total Help needed moving to and from a bed to a chair (including a wheelchair)?: Total Help needed standing up from a chair using your arms (e.g., wheelchair or bedside chair)?: Total Help needed to walk in hospital room?: Total Help needed climbing 3-5 steps with a railing? : Total 6 Click Score: 7    End of Session Equipment Utilized During Treatment: Gait belt;Oxygen Activity Tolerance: Patient tolerated treatment well Patient left: in chair;with call bell/phone within reach;with chair alarm set;with SCD's reapplied (hip abduction wedge in place) Nurse Communication: Mobility status (O2) PT Visit Diagnosis: Muscle weakness (generalized) (M62.81);Difficulty in walking, not elsewhere classified (R26.2);Pain;Unsteadiness on feet (R26.81) Pain - Right/Left: Right Pain - part of body: Hip     Time: 0801-0824 PT Time Calculation (min) (ACUTE ONLY): 23 min  Charges:  $Therapeutic Activity: 23-37 mins                     Lavone Nian, PT, DPT 04/03/21, 8:33 AM    Waunita Schooner 04/03/2021, 8:31 AM

## 2021-04-03 NOTE — Progress Notes (Signed)
Progress Note    Victoria Esparza  ZWC:[id number] DOB: Apr 22, 1928  DOA: 03/30/2021 PCP: Orvis Brill, [facility]      Brief Narrative:    Medical records reviewed and are as summarized below:  Victoria Esparza is a 87 y.o. female       Assessment/Plan:   Principal Problem:   Closed displaced fracture of right femoral neck ([place]) Active Problems:   Benign essential HTN   OP (osteoporosis)   Paroxysmal atrial fibrillation (HCC)   Psoriasis with arthropathy (HCC)   Leukocytosis    Body mass index is 22.4 kg/m.   Displaced, closed proximal right femoral neck fracture: s/p right bipolar hip hemiarthroplasty on 03/31/2021.  Analgesics as needed for pain.  Follow-up with orthopedic surgeon.  Postoperative hypotension: Resolved.  Discontinue midodrine and monitor BP.  Acute hypoxic and hypercapnic respiratory failure, chronic hypoxic respiratory failure likely from COPD: Continue 2 L/min oxygen via nasal collar.  Poor oral intake with poor urine output: Start IV fluids for hydration and monitor urine output.  Acute blood loss anemia: No indication for blood transfusion at this time.  Monitor H&H.  History of hypertension, paroxysmal atrial fibrillation: Hold metoprolol  Psoriasis with arthropathy: She is on weekly Enbrel and methotrexate  Depression, mild cognitive impairment: Continue psychotropics  COPD: Stable.  Bronchodilators as needed.  Awaiting placement to SNF.   Diet Order            Diet regular Room service appropriate? Yes; Fluid consistency: Thin  Diet effective now                    Consultants:  Orthopedic surgeon  Procedures:  Right bipolar hip hemiarthroplasty on 03/31/2021    Medications:   . aspirin EC  325 mg Oral Q breakfast  . calcium-vitamin D  1 tablet Oral Daily  . Chlorhexidine Gluconate Cloth  6 each Topical Q0600  . citalopram  10 mg Oral Daily  . docusate  100 mg Oral BID  . feeding supplement  237 mL  Oral BID BM  . mirtazapine  15 mg Oral QHS  . multivitamin with minerals  1 tablet Oral Daily  . senna-docusate  2 tablet Oral BID   Continuous Infusions: . sodium chloride    . dextrose 5 % and 0.9% NaCl 75 mL/hr at 04/03/21 0840     Anti-infectives (From admission, onward)   Start     Dose/Rate Route Frequency Ordered Stop   04/01/21 0200  ceFAZolin (ANCEF) IVPB 1 g/50 mL premix        1 g 100 mL/hr over 30 Minutes Intravenous Every 8 hours 04/01/21 0134 04/01/21 1358   03/31/21 1842  vancomycin (VANCOCIN) powder  Status:  Discontinued          As needed 03/31/21 1842 03/31/21 1907   03/31/21 1615  ceFAZolin (ANCEF) IVPB 2g/100 mL premix        2 g 200 mL/hr over 30 Minutes Intravenous  Once 03/31/21 1614 03/31/21 1710   03/31/21 1608  ceFAZolin (ANCEF) 2-4 GM/100ML-% IVPB       Note to Pharmacy: Trudie Reed   : cabinet override      03/31/21 1608 03/31/21 1702             Family Communication/Anticipated D/C date and plan/Code Status   DVT prophylaxis: SCDs Start: 03/31/21 2042 SCDs Start: 03/30/21 2055     Code Status: Full Code  Family Communication: None Disposition Plan:  Status is: Inpatient  Remains inpatient appropriate because:Inpatient level of care appropriate due to severity of illness   Dispo: The patient is from: SNF              Anticipated d/c is to: SNF              Patient currently is not medically stable to d/c.   Difficult to place patient No           Subjective:   Interval events noted.  According to her nurse, she has not been eating or drinking well.  Objective:    Vitals:   04/02/21 1706 04/02/21 2014 04/03/21 0512 04/03/21 0811  BP: (!) 124/54 135/87 (!) 127/93 (!) 154/80  Pulse: 99 (!) 103 99 88  Resp: 16 17 18 16   Temp: 98.4 F (36.9 C) 99 F (37.2 C) 98.4 F (36.9 C) 98.1 F (36.7 C)  TempSrc:      SpO2: 91% (!) 66% 90% 91%  Weight:      Height:       No data found.   Intake/Output Summary (Last  24 hours) at 04/03/2021 1509 Last data filed at 04/03/2021 1351 Gross per 24 hour  Intake 120 ml  Output 450 ml  Net -330 ml   Filed Weights   03/30/21 1644 04/01/21 0125  Weight: 64 kg 59.2 kg    Exam:  GEN: NAD SKIN: No rash EYES: EOMI ENT: MMM CV: RRR PULM: CTA B ABD: soft, ND, NT, +BS CNS: AAO x 1 (person), non focal EXT: Dressing on right hip surgical is intact           Data Reviewed:   I have personally reviewed following labs and imaging studies:  Labs: Labs show the following:   Basic Metabolic Panel: Recent Labs  Lab 03/30/21 1933 03/31/21 0449 04/01/21 0119 04/02/21 0423 04/03/21 0550  NA 135 135 137 139 138  K 4.3 4.1 4.1 4.3 4.1  CL 99 100 104 107 106  CO2 27 28 27 27 27   GLUCOSE 95 91 75 99 90  BUN 17 17 23 22 23   CREATININE 0.84 1.05* 0.87 0.74 0.56  CALCIUM 9.2 8.5* 7.9* 7.8* 8.3*   GFR Estimated Creatinine Clearance: 40.4 mL/min (by C-G formula based on SCr of 0.56 mg/dL). Liver Function Tests: Recent Labs  Lab 03/30/21 1933  AST 22  ALT 13  ALKPHOS 61  BILITOT 0.6  PROT 6.8  ALBUMIN 3.3*   No results for input(s): LIPASE, AMYLASE in the last 168 hours. No results for input(s): AMMONIA in the last 168 hours. Coagulation profile No results for input(s): INR, PROTIME in the last 168 hours.  CBC: Recent Labs  Lab 03/30/21 1933 03/31/21 0449 04/01/21 0119 04/01/21 0658 04/01/21 1301 04/01/21 1852 04/02/21 0423 04/03/21 0550  WBC 20.7* 19.0* 18.2*  --   --   --  12.1* 11.3*  NEUTROABS 16.7*  --  13.2*  --   --   --   --   --   HGB 13.0 11.8* 9.6* 9.7* 9.6* 9.7* 8.9* 8.4*  HCT 40.1 36.2 30.5* 30.2* 29.9* 30.5* 28.2* 25.9*  MCV 90.9 90.7 94.4  --   --   --  92.5 91.2  PLT 161 149* 121*  --   --   --  144* 162   Cardiac Enzymes: No results for input(s): CKTOTAL, CKMB, CKMBINDEX, TROPONINI in the last 168 hours. BNP (last 3 results) No results for input(s): PROBNP in the last 8760  hours. CBG: Recent Labs  Lab  04/02/21 2016 04/03/21 0021 04/03/21 0513 04/03/21 0811 04/03/21 1159  GLUCAP 117* 118* 90 96 106*   D-Dimer: No results for input(s): DDIMER in the last 72 hours. Hgb A1c: No results for input(s): HGBA1C in the last 72 hours. Lipid Profile: No results for input(s): CHOL, HDL, LDLCALC, TRIG, CHOLHDL, LDLDIRECT in the last 72 hours. Thyroid function studies: No results for input(s): TSH, T4TOTAL, T3FREE, THYROIDAB in the last 72 hours.  Invalid input(s): FREET3 Anemia work up: No results for input(s): VITAMINB12, FOLATE, FERRITIN, TIBC, IRON, RETICCTPCT in the last 72 hours. Sepsis Labs: Recent Labs  Lab 03/31/21 0449 04/01/21 0119 04/01/21 0405 04/01/21 0423 04/01/21 0658 04/02/21 0423 04/03/21 0550  PROCALCITON  --   --  <0.10  --   --  <0.10 <0.10  WBC 19.0* 18.2*  --   --   --  12.1* 11.3*  LATICACIDVEN  --   --   --  1.2 1.2  --   --     Microbiology Recent Results (from the past 240 hour(s))  Resp Panel by RT-PCR (Flu A&B, Covid) Nasopharyngeal Swab     Status: None   Collection Time: 03/30/21  8:06 PM   Specimen: Nasopharyngeal Swab; Nasopharyngeal(NP) swabs in vial transport medium  Result Value Ref Range Status   SARS Coronavirus 2 by RT PCR NEGATIVE NEGATIVE Final    Comment: (NOTE) SARS-CoV-2 target nucleic acids are NOT DETECTED.  The SARS-CoV-2 RNA is generally detectable in upper respiratory specimens during the acute phase of infection. The lowest concentration of SARS-CoV-2 viral copies this assay can detect is 138 copies/mL. A negative result does not preclude SARS-Cov-2 infection and should not be used as the sole basis for treatment or other patient management decisions. A negative result may occur with  improper specimen collection/handling, submission of specimen other than nasopharyngeal swab, presence of viral mutation(s) within the areas targeted by this assay, and inadequate number of viral copies(<138 copies/mL). A negative result must be  combined with clinical observations, patient history, and epidemiological information. The expected result is Negative.  Fact Sheet for Patients:  EntrepreneurPulse.com.au  Fact Sheet for Healthcare Providers:  IncredibleEmployment.be  This test is no t yet approved or cleared by the [place] FDA and  has been authorized for detection and/or diagnosis of SARS-CoV-2 by FDA under an Emergency Use Authorization (EUA). This EUA will remain  in effect (meaning this test can be used) for the duration of the COVID-19 declaration under Section 564(b)(1) of the Act, 21 U.S.C.section 360bbb-3(b)(1), unless the authorization is terminated  or revoked sooner.       Influenza A by PCR NEGATIVE NEGATIVE Final   Influenza B by PCR NEGATIVE NEGATIVE Final    Comment: (NOTE) The Xpert Xpress SARS-CoV-2/FLU/RSV plus assay is intended as an aid in the diagnosis of influenza from Nasopharyngeal swab specimens and should not be used as a sole basis for treatment. Nasal washings and aspirates are unacceptable for Xpert Xpress SARS-CoV-2/FLU/RSV testing.  Fact Sheet for Patients: EntrepreneurPulse.com.au  Fact Sheet for Healthcare Providers: IncredibleEmployment.be  This test is not yet approved or cleared by the [place] FDA and has been authorized for detection and/or diagnosis of SARS-CoV-2 by FDA under an Emergency Use Authorization (EUA). This EUA will remain in effect (meaning this test can be used) for the duration of the COVID-19 declaration under Section 564(b)(1) of the Act, 21 U.S.C. section 360bbb-3(b)(1), unless the authorization is terminated or revoked.  Performed at  [facility], [address], [place], [place] [address]   Urine Culture     Status: None   Collection Time: 04/01/21  4:30 AM   Specimen: Urine, Random  Result Value Ref Range Status   Specimen Description   Final     URINE, RANDOM Performed at [facility], [address], [place], [place] [address]    Special Requests   Final    NONE Performed at [facility], [address], [place], [place] [address]    Culture   Final    NO GROWTH Performed at [facility], [place] [address] [place],  [address]    Report Status 04/02/2021 FINAL  Final    Procedures and diagnostic studies:  No results found.             LOS: 4 days   Joselyne Spake  [facility]   Pager on [email or web address]. If 7PM-7AM, please contact night-coverage at [email or web address]     04/03/2021, 3:09 PM

## 2021-04-03 NOTE — Progress Notes (Signed)
Physical Therapy Treatment Patient Details Name: Victoria Esparza MRN: [id number] DOB: 06-27-1928 Today's Date: 04/03/2021    History of Present Illness 87 y/o female s/p R hip fx with hemi arthroplasty repair 4/13.  Post surgery she had issues with sustained hypotension and was placed in CCU.    PT Comments    Pt seen for PT tx with daughter present in room to observe. PT awakens pt upon arrival. Pt continues to require MAX assist for sit<>stand transfers, MAX assist to attempt taking a few steps chair>bed with RW but with poor safety awareness as pt lets go of RW & attempts to reach for bed requiring max/total assist for hand placement, cuing for safety & assistance to complete turn to bed before sitting. Pt performs RLE strengthening exercises but requires multimodal cuing for technique 2/2 impaired cognition & ability to follow commands. Pt with no awareness of posterior hip precautions & hip abduction wedge in place upon PT departure.    Follow Up Recommendations  SNF     Equipment Recommendations  None recommended by PT    Recommendations for Other Services       Precautions / Restrictions Precautions Precautions: Posterior Hip;Fall Restrictions Weight Bearing Restrictions: Yes RLE Weight Bearing: Weight bearing as tolerated    Mobility  Bed Mobility Overal bed mobility: Needs Assistance Bed Mobility: Sit to Supine       Sit to supine: Mod assist;HOB elevated   General bed mobility comments: assistance to elevate BLE onto bed    Transfers Overall transfer level: Needs assistance Equipment used: Rolling walker (2 wheeled) Transfers: Sit to/from Stand Sit to Stand: Max assist (max cuing for hand placement, assistance for placing B hands on RW in proper positioning)            Ambulation/Gait Ambulation/Gait assistance: Max assist Gait Distance (Feet): 2 Feet Assistive device: Rolling walker (2 wheeled) Gait Pattern/deviations: Decreased step length -  left;Decreased step length - right;Decreased dorsiflexion - right;Decreased dorsiflexion - left;Decreased stride length;Decreased weight shift to right Gait velocity: decreased   General Gait Details: Pt takes 2 steps forwards from recliner>bed with max assist & decreased weight shifting R to allow increased ease of LLE advancement then begins to say she can't do anymore & lets go of RW & attempts to reach for bed. PT provides MAX assist for hand placement on RW & safe turning to sit on EOB.   Stairs             Wheelchair Mobility    Modified Rankin (Stroke Patients Only)       Balance Overall balance assessment: Needs assistance Sitting-balance support: Feet supported;Bilateral upper extremity supported Sitting balance-Leahy Scale: Fair Sitting balance - Comments: supervision sitting EOB   Standing balance support: Bilateral upper extremity supported Standing balance-Leahy Scale: Zero Standing balance comment: max assist & BUE support on RW                            Cognition Arousal/Alertness: Lethargic Behavior During Therapy: WFL for tasks assessed/performed Overall Cognitive Status: History of cognitive impairments - at baseline                                 General Comments: Oriented to self. PT attempts to orient pt to location/situation throughout session. Able to identify daughter in room, but requires extra time to recall her name.  Exercises Total Joint Exercises Ankle Circles/Pumps: AAROM;Both;10 reps;Supine Short Arc Quad: AAROM;Strengthening;Right;10 reps;Supine    General Comments General comments (skin integrity, edema, etc.): Pt on 2L/min via nasal cannula, SpO2 >90% at end of session.      Pertinent Vitals/Pain Pain Assessment: Faces Faces Pain Scale: Hurts whole lot Pain Location: RLE with movement & weight bearing Pain Descriptors / Indicators: Sore Pain Intervention(s): Limited activity within patient's  tolerance;Monitored during session    Home Living                      Prior Function            PT Goals (current goals can now be found in the care plan section) Acute Rehab PT Goals Patient Stated Goal: pt confused, none stated PT Goal Formulation: With patient Time For Goal Achievement: 04/15/21 Potential to Achieve Goals: Fair Progress towards PT goals: Progressing toward goals    Frequency    BID      PT Plan Current plan remains appropriate    Co-evaluation              AM-PAC PT "6 Clicks" Mobility   Outcome Measure  Help needed turning from your back to your side while in a flat bed without using bedrails?: A Lot Help needed moving from lying on your back to sitting on the side of a flat bed without using bedrails?: Total Help needed moving to and from a bed to a chair (including a wheelchair)?: Total Help needed standing up from a chair using your arms (e.g., wheelchair or bedside chair)?: Total Help needed to walk in hospital room?: Total Help needed climbing 3-5 steps with a railing? : Total 6 Click Score: 7    End of Session Equipment Utilized During Treatment: Gait belt;Oxygen Activity Tolerance: Patient limited by pain;Patient limited by lethargy Patient left: in bed;with bed alarm set;with call bell/phone within reach;with SCD's reapplied;with family/visitor present;with nursing/sitter in room (hip abduction wedge in place) Nurse Communication:  (pill found in pt's recliner) PT Visit Diagnosis: Muscle weakness (generalized) (M62.81);Difficulty in walking, not elsewhere classified (R26.2);Pain;Unsteadiness on feet (R26.81) Pain - Right/Left: Right Pain - part of body: Hip     Time: 6387-5643 PT Time Calculation (min) (ACUTE ONLY): 15 min  Charges:  $Therapeutic Activity: 8-22 mins                     Lavone Nian, PT, DPT 04/03/21, 12:48 PM    Waunita Schooner 04/03/2021, 12:46 PM

## 2021-04-03 NOTE — TOC Progression Note (Signed)
Transition of Care [facility]) - Progression Note    Patient Details  Name: Victoria Esparza MRN: [id number] Date of Birth: July 13, 1928  Transition of [facility] CM/SW Contact  Zigmund Daniel Dorian Pod, RN Phone [phone] 04/03/2021, 5:20 PM  Clinical Narrative:    Requested to speak with pt's daughter Victoria Esparza. RN visited pt at bedside this morning. Daughter Victoria Esparza) requested to expand the bed-search to [place] area for SNF placement. States these areas are closer for her visitation. Encourage the daughter to communicate with the existing assistive living facility concerning possible SNF placement (receptive). Will expand bed-search as requested and continue to follow up accordingly with pt's changes. Pt pending catheterization due to no output. Daughter reports pt has a good support system, safe return to the assisted living facility with accommodations of all her medications alone with transportation to all her medical appointments.  TOC will continue to follow up accordingly for discharge planning needs.        Expected Discharge Plan and Services                                                 Social Determinants of Health (SDOH) Interventions    Readmission Risk Interventions No flowsheet data found.

## 2021-04-04 LAB — GLUCOSE, CAPILLARY
Glucose-Capillary: 105 mg/dL — ABNORMAL HIGH (ref 70–99)
Glucose-Capillary: 91 mg/dL (ref 70–99)
Glucose-Capillary: 92 mg/dL (ref 70–99)
Glucose-Capillary: 93 mg/dL (ref 70–99)
Glucose-Capillary: 94 mg/dL (ref 70–99)
Glucose-Capillary: 96 mg/dL (ref 70–99)

## 2021-04-04 LAB — CBC
HCT: 25 % — ABNORMAL LOW (ref 36.0–46.0)
Hemoglobin: 8 g/dL — ABNORMAL LOW (ref 12.0–15.0)
MCH: 29.5 pg (ref 26.0–34.0)
MCHC: 32 g/dL (ref 30.0–36.0)
MCV: 92.3 fL (ref 80.0–100.0)
Platelets: 159 10*3/uL (ref 150–400)
RBC: 2.71 MIL/uL — ABNORMAL LOW (ref 3.87–5.11)
RDW: 16.9 % — ABNORMAL HIGH (ref 11.5–15.5)
WBC: 9.1 10*3/uL (ref 4.0–10.5)
nRBC: 0 % (ref 0.0–0.2)

## 2021-04-04 LAB — BASIC METABOLIC PANEL
Anion gap: 7 (ref 5–15)
BUN: 19 mg/dL (ref 8–23)
CO2: 28 mmol/L (ref 22–32)
Calcium: 8.3 mg/dL — ABNORMAL LOW (ref 8.9–10.3)
Chloride: 104 mmol/L (ref 98–111)
Creatinine, Ser: 0.57 mg/dL (ref 0.44–1.00)
GFR, Estimated: >60 mL/min (ref >60)
Glucose, Bld: 89 mg/dL (ref 70–99)
Potassium: 4.1 mmol/L (ref 3.5–5.1)
Sodium: 139 mmol/L (ref 135–145)

## 2021-04-04 NOTE — Progress Notes (Signed)
Progress Note    Victoria Esparza  HUT:[id number] DOB: 02-18-1928  DOA: 03/30/2021 PCP: Orvis Brill, [facility]      Brief Narrative:    Medical records reviewed and are as summarized below:  Victoria Esparza is a 87 y.o. female       Assessment/Plan:   Principal Problem:   Closed displaced fracture of right femoral neck ([place]) Active Problems:   Benign essential HTN   OP (osteoporosis)   Paroxysmal atrial fibrillation (HCC)   Psoriasis with arthropathy (HCC)   Leukocytosis    Body mass index is 22.4 kg/m.   Displaced, closed proximal right femoral neck fracture: s/p right bipolar hip hemiarthroplasty on 03/31/2021.  Analgesics as needed for pain.  Follow-up with orthopedic surgeon.  Postoperative hypotension: Resolved.  Midodrine has been discontinued.  Acute hypoxic and hypercapnic respiratory failure, chronic hypoxic respiratory failure likely from COPD: Continue 2 L/min oxygen via nasal collar.  Poor oral intake with poor urine output: Urine output is better.  Discontinue IV fluids.  Encourage adequate oral intake.  Acute blood loss anemia: No indication for blood transfusion at this time.  Monitor H&H.  History of hypertension, paroxysmal atrial fibrillation: Hold metoprolol  Psoriasis with arthropathy: She is on weekly Enbrel and methotrexate  Depression, mild cognitive impairment: Continue psychotropics  COPD: Stable.  Bronchodilators as needed.  Awaiting placement to SNF.   Diet Order            Diet regular Room service appropriate? Yes; Fluid consistency: Thin  Diet effective now                    Consultants:  Orthopedic surgeon  Procedures:  Right bipolar hip hemiarthroplasty on 03/31/2021    Medications:   . aspirin EC  325 mg Oral Q breakfast  . calcium-vitamin D  1 tablet Oral Daily  . citalopram  10 mg Oral Daily  . docusate  100 mg Oral BID  . feeding supplement  237 mL Oral BID BM  . mirtazapine  15 mg  Oral QHS  . multivitamin with minerals  1 tablet Oral Daily  . senna-docusate  2 tablet Oral BID   Continuous Infusions: . sodium chloride       Anti-infectives (From admission, onward)   Start     Dose/Rate Route Frequency Ordered Stop   04/01/21 0200  ceFAZolin (ANCEF) IVPB 1 g/50 mL premix        1 g 100 mL/hr over 30 Minutes Intravenous Every 8 hours 04/01/21 0134 04/01/21 1358   03/31/21 1842  vancomycin (VANCOCIN) powder  Status:  Discontinued          As needed 03/31/21 1842 03/31/21 1907   03/31/21 1615  ceFAZolin (ANCEF) IVPB 2g/100 mL premix        2 g 200 mL/hr over 30 Minutes Intravenous  Once 03/31/21 1614 03/31/21 1710   03/31/21 1608  ceFAZolin (ANCEF) 2-4 GM/100ML-% IVPB       Note to Pharmacy: Trudie Reed   : cabinet override      03/31/21 1608 03/31/21 1702             Family Communication/Anticipated D/C date and plan/Code Status   DVT prophylaxis: SCDs Start: 03/31/21 2042     Code Status: Full Code  Family Communication: None Disposition Plan:    Status is: Inpatient  Remains inpatient appropriate because:Inpatient level of care appropriate due to severity of illness   Dispo: The patient is  from: SNF              Anticipated d/c is to: SNF              Patient currently is not medically stable to d/c.   Difficult to place patient No           Subjective:   C/o right hip pain.  Objective:    Vitals:   04/03/21 2348 04/04/21 0509 04/04/21 0809 04/04/21 1142  BP: 125/60 (!) 121/57 (!) 120/55 124/78  Pulse: 94 68 86 88  Resp: 18 14 16 15   Temp: 98.8 F (37.1 C) 98.7 F (37.1 C) 98.9 F (37.2 C) 98.8 F (37.1 C)  TempSrc: Oral Oral    SpO2: 92% 91% 91% 95%  Weight:      Height:       No data found.   Intake/Output Summary (Last 24 hours) at 04/04/2021 1323 Last data filed at 04/04/2021 1033 Gross per 24 hour  Intake 1240 ml  Output 650 ml  Net 590 ml   Filed Weights   03/30/21 1644 04/01/21 0125  Weight: 64  kg 59.2 kg    Exam:  GEN: NAD SKIN: Warm and dry EYES: No pallor or icterus ENT: MMM CV: RRR PULM: CTA B ABD: soft, ND, NT, +BS CNS: AAO x 1 (person), non focal EXT: No edema or tenderness.  Dressing on right hip surgical wound is intact             Data Reviewed:   I have personally reviewed following labs and imaging studies:  Labs: Labs show the following:   Basic Metabolic Panel: Recent Labs  Lab 03/31/21 0449 04/01/21 0119 04/02/21 0423 04/03/21 0550 04/04/21 0631  NA 135 137 139 138 139  K 4.1 4.1 4.3 4.1 4.1  CL 100 104 107 106 104  CO2 28 27 27 27 28   GLUCOSE 91 75 99 90 89  BUN 17 23 22 23 19   CREATININE 1.05* 0.87 0.74 0.56 0.57  CALCIUM 8.5* 7.9* 7.8* 8.3* 8.3*   GFR Estimated Creatinine Clearance: 40.4 mL/min (by C-G formula based on SCr of 0.57 mg/dL). Liver Function Tests: Recent Labs  Lab 03/30/21 1933  AST 22  ALT 13  ALKPHOS 61  BILITOT 0.6  PROT 6.8  ALBUMIN 3.3*   No results for input(s): LIPASE, AMYLASE in the last 168 hours. No results for input(s): AMMONIA in the last 168 hours. Coagulation profile No results for input(s): INR, PROTIME in the last 168 hours.  CBC: Recent Labs  Lab 03/30/21 1933 03/31/21 0449 04/01/21 0119 04/01/21 0658 04/01/21 1301 04/01/21 1852 04/02/21 0423 04/03/21 0550 04/04/21 0631  WBC 20.7* 19.0* 18.2*  --   --   --  12.1* 11.3* 9.1  NEUTROABS 16.7*  --  13.2*  --   --   --   --   --   --   HGB 13.0 11.8* 9.6*   < > 9.6* 9.7* 8.9* 8.4* 8.0*  HCT 40.1 36.2 30.5*   < > 29.9* 30.5* 28.2* 25.9* 25.0*  MCV 90.9 90.7 94.4  --   --   --  92.5 91.2 92.3  PLT 161 149* 121*  --   --   --  144* 162 159   < > = values in this interval not displayed.   Cardiac Enzymes: No results for input(s): CKTOTAL, CKMB, CKMBINDEX, TROPONINI in the last 168 hours. BNP (last 3 results) No results for input(s): PROBNP in the last  8760 hours. CBG: Recent Labs  Lab 04/03/21 2103 04/04/21 0052  04/04/21 0504 04/04/21 0804 04/04/21 1145  GLUCAP 97 93 91 92 96   D-Dimer: No results for input(s): DDIMER in the last 72 hours. Hgb A1c: No results for input(s): HGBA1C in the last 72 hours. Lipid Profile: No results for input(s): CHOL, HDL, LDLCALC, TRIG, CHOLHDL, LDLDIRECT in the last 72 hours. Thyroid function studies: No results for input(s): TSH, T4TOTAL, T3FREE, THYROIDAB in the last 72 hours.  Invalid input(s): FREET3 Anemia work up: No results for input(s): VITAMINB12, FOLATE, FERRITIN, TIBC, IRON, RETICCTPCT in the last 72 hours. Sepsis Labs: Recent Labs  Lab 04/01/21 0119 04/01/21 0405 04/01/21 0423 04/01/21 6063 04/02/21 0423 04/03/21 0550 04/04/21 0631  PROCALCITON  --  <0.10  --   --  <0.10 <0.10  --   WBC 18.2*  --   --   --  12.1* 11.3* 9.1  LATICACIDVEN  --   --  1.2 1.2  --   --   --     Microbiology Recent Results (from the past 240 hour(s))  Resp Panel by RT-PCR (Flu A&B, Covid) Nasopharyngeal Swab     Status: None   Collection Time: 03/30/21  8:06 PM   Specimen: Nasopharyngeal Swab; Nasopharyngeal(NP) swabs in vial transport medium  Result Value Ref Range Status   SARS Coronavirus 2 by RT PCR NEGATIVE NEGATIVE Final    Comment: (NOTE) SARS-CoV-2 target nucleic acids are NOT DETECTED.  The SARS-CoV-2 RNA is generally detectable in upper respiratory specimens during the acute phase of infection. The lowest concentration of SARS-CoV-2 viral copies this assay can detect is 138 copies/mL. A negative result does not preclude SARS-Cov-2 infection and should not be used as the sole basis for treatment or other patient management decisions. A negative result may occur with  improper specimen collection/handling, submission of specimen other than nasopharyngeal swab, presence of viral mutation(s) within the areas targeted by this assay, and inadequate number of viral copies(<138 copies/mL). A negative result must be combined with clinical observations,  patient history, and epidemiological information. The expected result is Negative.  Fact Sheet for Patients:  EntrepreneurPulse.com.au  Fact Sheet for Healthcare Providers:  IncredibleEmployment.be  This test is no t yet approved or cleared by the [place] FDA and  has been authorized for detection and/or diagnosis of SARS-CoV-2 by FDA under an Emergency Use Authorization (EUA). This EUA will remain  in effect (meaning this test can be used) for the duration of the COVID-19 declaration under Section 564(b)(1) of the Act, 21 U.S.C.section 360bbb-3(b)(1), unless the authorization is terminated  or revoked sooner.       Influenza A by PCR NEGATIVE NEGATIVE Final   Influenza B by PCR NEGATIVE NEGATIVE Final    Comment: (NOTE) The Xpert Xpress SARS-CoV-2/FLU/RSV plus assay is intended as an aid in the diagnosis of influenza from Nasopharyngeal swab specimens and should not be used as a sole basis for treatment. Nasal washings and aspirates are unacceptable for Xpert Xpress SARS-CoV-2/FLU/RSV testing.  Fact Sheet for Patients: EntrepreneurPulse.com.au  Fact Sheet for Healthcare Providers: IncredibleEmployment.be  This test is not yet approved or cleared by the [place] FDA and has been authorized for detection and/or diagnosis of SARS-CoV-2 by FDA under an Emergency Use Authorization (EUA). This EUA will remain in effect (meaning this test can be used) for the duration of the COVID-19 declaration under Section 564(b)(1) of the Act, 21 U.S.C. section 360bbb-3(b)(1), unless the authorization is terminated or revoked.  Performed  at [facility], [address], [place], [place] [address]   Urine Culture     Status: None   Collection Time: 04/01/21  4:30 AM   Specimen: Urine, Random  Result Value Ref Range Status   Specimen Description   Final    URINE, RANDOM Performed at [facility], [address] [place], [place] [address]    Special Requests   Final    NONE Performed at [facility], [address], [place], [place] [address]    Culture   Final    NO GROWTH Performed at [facility], [place] [address], [place], [place] [address]    Report Status 04/02/2021 FINAL  Final    Procedures and diagnostic studies:  No results found.             LOS: 5 days   Akilah Cureton  [facility]   Pager on [email or web address]. If 7PM-7AM, please contact night-coverage at [email or web address]     04/04/2021, 1:23 PM

## 2021-04-04 NOTE — Progress Notes (Signed)
Physical Therapy Treatment Patient Details Name: Victoria Esparza MRN: [id number] DOB: October 20, 1928 Today's Date: 04/04/2021    History of Present Illness 87 y/o female s/p R hip fx with hemi arthroplasty repair 4/13.  Post surgery she had issues with sustained hypotension and was placed in CCU.    PT Comments    Pt finished bathing with nursing staff but agrees to session.  Participated in exercises as described below.  O2 noted to be 87% at rest and did not increase with deep breathing and time.  O2 donned at 2 lpm.  Discussed with RN.  She agrees to sitting EOB.  Does quite well with mobility today only requiring min a x 1 to get to seated position.  Generally steady in sitting but supervision provided.  She does endorse some dizziness while sitting that does not change with time.  After several minutes she asks to lay back down and self initiates due to general fatigue.  Encouraged to sit in recliner but she declined.   Follow Up Recommendations  SNF     Equipment Recommendations  None recommended by PT    Recommendations for Other Services       Precautions / Restrictions Precautions Precautions: Posterior Hip;Fall Restrictions Weight Bearing Restrictions: Yes RLE Weight Bearing: Weight bearing as tolerated    Mobility  Bed Mobility Overal bed mobility: Needs Assistance       Supine to sit: Min assist Sit to supine: Mod assist        Transfers                 General transfer comment: declined  Ambulation/Gait                 Stairs             Wheelchair Mobility    Modified Rankin (Stroke Patients Only)       Balance Overall balance assessment: Needs assistance Sitting-balance support: Feet supported;Bilateral upper extremity supported Sitting balance-Leahy Scale: Fair Sitting balance - Comments: supervision sitting EOB                                    Cognition Arousal/Alertness: Awake/alert Behavior During  Therapy: WFL for tasks assessed/performed Overall Cognitive Status: History of cognitive impairments - at baseline                                        Exercises Total Joint Exercises Ankle Circles/Pumps: AROM;10 reps;Both Quad Sets: AROM;10 reps;Both (pt struggled with the concept but did manage 10 reps, some better than others) Short Arc Quad: AAROM;PROM;10 reps;Both Heel Slides: AAROM;10 reps;Both (lightly resisted leg extension per tolerance) Hip ABduction/ADduction: AAROM;10 reps;Both (did not indicated a lot of pain, but struggled to give much AROM effort) Long Arc Quad: AROM;Strengthening;10 reps;Seated;Both    General Comments        Pertinent Vitals/Pain Pain Assessment: Faces Faces Pain Scale: Hurts a little bit Pain Location: RLE with movement & weight bearing Pain Descriptors / Indicators: Sore Pain Intervention(s): Limited activity within patient's tolerance;Monitored during session    Home Living                      Prior Function            PT Goals (current goals can now  be found in the care plan section) Progress towards PT goals: Progressing toward goals    Frequency    BID      PT Plan Current plan remains appropriate    Co-evaluation              AM-PAC PT "6 Clicks" Mobility   Outcome Measure  Help needed turning from your back to your side while in a flat bed without using bedrails?: A Lot Help needed moving from lying on your back to sitting on the side of a flat bed without using bedrails?: A Little Help needed moving to and from a bed to a chair (including a wheelchair)?: Total Help needed standing up from a chair using your arms (e.g., wheelchair or bedside chair)?: Total Help needed to walk in hospital room?: Total Help needed climbing 3-5 steps with a railing? : Total 6 Click Score: 9    End of Session Equipment Utilized During Treatment: Gait belt;Oxygen Activity Tolerance: Patient limited by  pain;Patient limited by lethargy Patient left: in bed;with bed alarm set;with call bell/phone within reach;with SCD's reapplied;with family/visitor present;with nursing/sitter in room (hip abduction wedge in place) Nurse Communication:  (pill found in pt's recliner) PT Visit Diagnosis: Muscle weakness (generalized) (M62.81);Difficulty in walking, not elsewhere classified (R26.2);Pain;Unsteadiness on feet (R26.81) Pain - Right/Left: Right Pain - part of body: Hip     Time: 6195-0932 PT Time Calculation (min) (ACUTE ONLY): 14 min  Charges:  $Therapeutic Exercise: 8-22 mins                    Chesley Noon, PTA 04/04/21, 11:01 AM

## 2021-04-04 NOTE — TOC Progression Note (Signed)
Transition of Care [facility]) - Progression Note    Patient Details  Name: ELIE GRAGERT MRN: [id number] Date of Birth: Jan 13, 1928  Transition of Care [facility]) CM/SW [place], LCSW Phone Number: 04/04/2021, 3:54 PM  Clinical Narrative:   Attempted call to patient's daughter [name]. [name] told RNCM she would like a SNF in [place]. Left VM for [name] informing her of current bed offers. Encouraged her to go to Medicare.gov and let TOC know of specific SNFs in that area she would be interested in and/or research current bed offers and let TOC know which one she chooses.         Expected Discharge Plan and Services                                                 Social Determinants of Health (SDOH) Interventions    Readmission Risk Interventions No flowsheet data found.

## 2021-04-05 LAB — GLUCOSE, CAPILLARY
Glucose-Capillary: 132 mg/dL — ABNORMAL HIGH (ref 70–99)
Glucose-Capillary: 165 mg/dL — ABNORMAL HIGH (ref 70–99)
Glucose-Capillary: 95 mg/dL (ref 70–99)

## 2021-04-05 NOTE — Progress Notes (Signed)
Physical Therapy Treatment Patient Details Name: Victoria Esparza MRN: [id number] DOB: [birth year] Today's Date: 04/05/2021    History of Present Illness 87 y/o female s/p R hip fx with hemi arthroplasty repair 4/13.  Post surgery she had issues with sustained hypotension and was placed in CCU.    PT Comments    Pt seen for skilled PT treatment with pt continuing to be only oriented to self, but pleasant & agreeable to tx. Pt is able to progress ambulation to 10 ft with mod assist +2 with impaired gait as noted below. Pt does endorse RLE pain with weight bearing with rest break provided. Pt performs RLE strengthening exercise as noted below. Continue to recommend STR upon d/c to maximize independence with functional mobility & reduce fall risk.      Follow Up Recommendations  SNF     Equipment Recommendations  None recommended by PT    Recommendations for Other Services       Precautions / Restrictions Precautions Precautions: Posterior Hip;Fall Restrictions Weight Bearing Restrictions: Yes RLE Weight Bearing: Weight bearing as tolerated    Mobility  Bed Mobility Overal bed mobility: Needs Assistance Bed Mobility: Sit to Supine     Supine to sit: Min assist     General bed mobility comments: assistance to elevate RLE onto bed    Transfers Overall transfer level: Needs assistance Equipment used: Rolling walker (2 wheeled) Transfers: Sit to/from Stand Sit to Stand: Mod assist;+2 physical assistance         General transfer comment: Max cuing for hand placement and boosting to power up to standing, PT provides manual facilitation for hand placement on RW  Ambulation/Gait Ambulation/Gait assistance: Mod assist;+2 physical assistance Gait Distance (Feet): 10 Feet Assistive device: Rolling walker (2 wheeled) Gait Pattern/deviations: Decreased step length - left;Decreased step length - right;Decreased dorsiflexion - right;Decreased dorsiflexion - left;Decreased  stride length;Decreased weight shift to right Gait velocity: decreased   General Gait Details: decreased step length BLE, shuffling steps, will continue to advance BLE as therapist maneuvers RW, requires max assist for RW management, posterior lean in standing   Stairs             Wheelchair Mobility    Modified Rankin (Stroke Patients Only)       Balance Overall balance assessment: Needs assistance Sitting-balance support: No upper extremity supported;Feet supported Sitting balance-Leahy Scale: Fair Sitting balance - Comments: lists posteriorally with divided attention, requires cuing/assist for correction to midline Postural control: Posterior lean Standing balance support: Bilateral upper extremity supported Standing balance-Leahy Scale: Poor Standing balance comment: posterior lean, max assist, BUE support on RW                            Cognition Arousal/Alertness: Awake/alert Behavior During Therapy: WFL for tasks assessed/performed Overall Cognitive Status: History of cognitive impairments - at baseline                                 General Comments: Oriented to self only, unable to follow leading questions to determine she is in hospital when given choice of hospital vs hotel, unable to recall/adhere to posterior hip precautions as pt crossing BLE throughout session despite ongoing cuing not to do so      Exercises Total Joint Exercises Long Arc Quad: AROM;Strengthening;10 reps;Seated;Right Other Exercises     General Comments General comments (skin integrity, edema, etc.):  Pt on 2L/min via nasal cannula, SpO2 >90%      Pertinent Vitals/Pain Pain Assessment: Faces Faces Pain Scale: Hurts little more Pain Location: RLE with movement & weight bearing Pain Descriptors / Indicators: Guarding Pain Intervention(s): Limited activity within patient's tolerance;Monitored during session;Repositioned    Home Living                       Prior Function            PT Goals (current goals can now be found in the care plan section) Acute Rehab PT Goals Patient Stated Goal: pt confused, none stated PT Goal Formulation: Patient unable to participate in goal setting Time For Goal Achievement: 04/15/21 Potential to Achieve Goals: Fair Progress towards PT goals: Progressing toward goals    Frequency    BID      PT Plan Current plan remains appropriate    Co-evaluation              AM-PAC PT "6 Clicks" Mobility   Outcome Measure  Help needed turning from your back to your side while in a flat bed without using bedrails?: A Lot Help needed moving from lying on your back to sitting on the side of a flat bed without using bedrails?: A Lot Help needed moving to and from a bed to a chair (including a wheelchair)?: A Lot Help needed standing up from a chair using your arms (e.g., wheelchair or bedside chair)?: A Lot Help needed to walk in hospital room?: Total Help needed climbing 3-5 steps with a railing? : Total 6 Click Score: 10    End of Session Equipment Utilized During Treatment: Gait belt;Oxygen Activity Tolerance: Patient tolerated treatment well Patient left: in bed;with call bell/phone within reach;with bed alarm set;with nursing/sitter in room;with SCD's reapplied (hip abduction wedge in place) Nurse Communication: Mobility status PT Visit Diagnosis: Muscle weakness (generalized) (M62.81);Difficulty in walking, not elsewhere classified (R26.2);Pain;Unsteadiness on feet (R26.81) Pain - Right/Left: Right Pain - part of body: Hip     Time: 9741-6384 PT Time Calculation (min) (ACUTE ONLY): 13 min  Charges:  $Therapeutic Activity: 8-22 mins                     Lavone Nian, PT, DPT 04/05/21, 3:33 PM    Waunita Schooner 04/05/2021, 3:32 PM

## 2021-04-05 NOTE — TOC Progression Note (Signed)
Transition of Care [facility]) - Progression Note    Patient Details  Name: Victoria Esparza MRN: [id number] Date of Birth: 11-04-1928  Transition [facility] Care [facility]) CM/SW [place], RN Phone Number: 04/05/2021, 3:10 PM  Clinical Narrative:   Damaris Schooner with [name] the daughter to obtain Vaccine dates, she stated the patient has not had the booster but she does give permission to give the patient the booster prior to DC to [facility]        Expected Discharge Plan and Services                                                 Social Determinants of Health (SDOH) Interventions    Readmission Risk Interventions No flowsheet data found.

## 2021-04-05 NOTE — TOC Progression Note (Signed)
Transition of Care [facility]) - Progression Note    Patient Details  Name: Victoria Esparza MRN: [id number] Date of Birth: 08/27/1928  Transition of Care [facility]) CM/SW [place], RN Phone Number: 04/05/2021, 11:14 AM  Clinical Narrative:   Called the patient's daughter [name] and left a VM requesting a call back, left contact information,         Expected Discharge Plan and Services                                                 Social Determinants of Health (SDOH) Interventions    Readmission Risk Interventions No flowsheet data found.

## 2021-04-05 NOTE — Care Management Important Message (Signed)
Important Message  Patient Details  Name: Victoria Esparza MRN: [id number] Date of Birth: Aug 26, 1928   Medicare Important Message Given:  Yes     Juliann Pulse A Buster Schueller 04/05/2021, 2:39 PM

## 2021-04-05 NOTE — Progress Notes (Signed)
Progress Note    Victoria Esparza  GDJ:[id number] DOB: 04/26/1928  DOA: 03/30/2021 PCP: Orvis Brill, [facility]      Brief Narrative:    Medical records reviewed and are as summarized below:  Victoria Esparza is a 87 y.o. female       Assessment/Plan:   Principal Problem:   Closed displaced fracture of right femoral neck ([place]) Active Problems:   Benign essential HTN   OP (osteoporosis)   Paroxysmal atrial fibrillation (HCC)   Psoriasis with arthropathy (HCC)   Leukocytosis    Body mass index is 22.4 kg/m.   Displaced, closed proximal right femoral neck fracture: s/p right bipolar hip hemiarthroplasty on 03/31/2021.  Analgesics as needed for pain.  Continue PT and OT.    Postoperative hypotension: Resolved.  Midodrine has been discontinued.  Acute hypoxic and hypercapnic respiratory failure, chronic hypoxic respiratory failure likely from COPD: Continue 2 L/min oxygen via nasal collar.  Poor oral intake with poor urine output: Urine output is better.  Encourage adequate oral intake.  Acute blood loss anemia: No indication for blood transfusion at this time.  Monitor H&H.  History of hypertension, paroxysmal atrial fibrillation: Hold metoprolol  Psoriasis with arthropathy: She is on weekly Enbrel and methotrexate  Depression, mild cognitive impairment: Continue psychotropics  COPD: Stable.  Bronchodilators as needed.  Awaiting placement to SNF.  His daughter requested covid booster vaccine per Delilah, case manager  Plan to discharge to SNF tomorrow.   Diet Order            Diet regular Room service appropriate? Yes; Fluid consistency: Thin  Diet effective now                    Consultants:  Orthopedic surgeon  Procedures:  Right bipolar hip hemiarthroplasty on 03/31/2021    Medications:   . aspirin EC  325 mg Oral Q breakfast  . calcium-vitamin D  1 tablet Oral Daily  . citalopram  10 mg Oral Daily  . docusate  100 mg  Oral BID  . feeding supplement  237 mL Oral BID BM  . mirtazapine  15 mg Oral QHS  . multivitamin with minerals  1 tablet Oral Daily  . senna-docusate  2 tablet Oral BID   Continuous Infusions: . sodium chloride       Anti-infectives (From admission, onward)   Start     Dose/Rate Route Frequency Ordered Stop   04/01/21 0200  ceFAZolin (ANCEF) IVPB 1 g/50 mL premix        1 g 100 mL/hr over 30 Minutes Intravenous Every 8 hours 04/01/21 0134 04/01/21 1358   03/31/21 1842  vancomycin (VANCOCIN) powder  Status:  Discontinued          As needed 03/31/21 1842 03/31/21 1907   03/31/21 1615  ceFAZolin (ANCEF) IVPB 2g/100 mL premix        2 g 200 mL/hr over 30 Minutes Intravenous  Once 03/31/21 1614 03/31/21 1710   03/31/21 1608  ceFAZolin (ANCEF) 2-4 GM/100ML-% IVPB       Note to Pharmacy: Trudie Reed   : cabinet override      03/31/21 1608 03/31/21 1702             Family Communication/Anticipated D/C date and plan/Code Status   DVT prophylaxis: SCDs Start: 03/31/21 2042     Code Status: Full Code  Family Communication: None Disposition Plan:    Status is: Inpatient  Remains inpatient appropriate  because:Inpatient level of care appropriate due to severity of illness   Dispo: The patient is from: SNF              Anticipated d/c is to: SNF              Patient currently is not medically stable to d/c.   Difficult to place patient No           Subjective:   C/o right hip pain  Objective:    Vitals:   04/04/21 2018 04/05/21 0336 04/05/21 0815 04/05/21 1520  BP: 136/71 127/72 118/71 121/67  Pulse: 86 77 94 100  Resp: 15 14 18 18   Temp: (!) 97.5 F (36.4 C) 98 F (36.7 C) 98.3 F (36.8 C) 98.2 F (36.8 C)  TempSrc: Oral   Oral  SpO2: 100% 98% 96% 98%  Weight:      Height:       Orthostatic VS for the past 24 hrs:  BP- Lying Pulse- Lying BP- Sitting Pulse- Sitting BP- Standing at 0 minutes Pulse- Standing at 0 minutes  04/05/21 1346 115/61 86  90/61 101 108/65 100     Intake/Output Summary (Last 24 hours) at 04/05/2021 1601 Last data filed at 04/05/2021 1032 Gross per 24 hour  Intake 0 ml  Output 375 ml  Net -375 ml   Filed Weights   03/30/21 1644 04/01/21 0125  Weight: 64 kg 59.2 kg    Exam:  GEN: NAD SKIN: Warm and dry EYES: No pallor or icterus ENT: MMM CV: RRR PULM: CTA B ABD: soft, ND, NT, +BS CNS: AAO x 1 (person), non focal EXT: Dressing on right hip surgical wound is intact.  Minimal tenderness right hip.              Data Reviewed:   I have personally reviewed following labs and imaging studies:  Labs: Labs show the following:   Basic Metabolic Panel: Recent Labs  Lab 03/31/21 0449 04/01/21 0119 04/02/21 0423 04/03/21 0550 04/04/21 0631  NA 135 137 139 138 139  K 4.1 4.1 4.3 4.1 4.1  CL 100 104 107 106 104  CO2 28 27 27 27 28   GLUCOSE 91 75 99 90 89  BUN 17 23 22 23 19   CREATININE 1.05* 0.87 0.74 0.56 0.57  CALCIUM 8.5* 7.9* 7.8* 8.3* 8.3*   GFR Estimated Creatinine Clearance: 40.4 mL/min (by C-G formula based on SCr of 0.57 mg/dL). Liver Function Tests: Recent Labs  Lab 03/30/21 1933  AST 22  ALT 13  ALKPHOS 61  BILITOT 0.6  PROT 6.8  ALBUMIN 3.3*   No results for input(s): LIPASE, AMYLASE in the last 168 hours. No results for input(s): AMMONIA in the last 168 hours. Coagulation profile No results for input(s): INR, PROTIME in the last 168 hours.  CBC: Recent Labs  Lab 03/30/21 1933 03/31/21 0449 04/01/21 0119 04/01/21 0658 04/01/21 1301 04/01/21 1852 04/02/21 0423 04/03/21 0550 04/04/21 0631  WBC 20.7* 19.0* 18.2*  --   --   --  12.1* 11.3* 9.1  NEUTROABS 16.7*  --  13.2*  --   --   --   --   --   --   HGB 13.0 11.8* 9.6*   < > 9.6* 9.7* 8.9* 8.4* 8.0*  HCT 40.1 36.2 30.5*   < > 29.9* 30.5* 28.2* 25.9* 25.0*  MCV 90.9 90.7 94.4  --   --   --  92.5 91.2 92.3  PLT 161 149* 121*  --   --   --  144* 162 159   < > = values in this interval not displayed.    Cardiac Enzymes: No results for input(s): CKTOTAL, CKMB, CKMBINDEX, TROPONINI in the last 168 hours. BNP (last 3 results) No results for input(s): PROBNP in the last 8760 hours. CBG: Recent Labs  Lab 04/04/21 1657 04/04/21 2040 04/05/21 0031 04/05/21 0351 04/05/21 0815  GLUCAP 94 105* 132* 165* 95   D-Dimer: No results for input(s): DDIMER in the last 72 hours. Hgb A1c: No results for input(s): HGBA1C in the last 72 hours. Lipid Profile: No results for input(s): CHOL, HDL, LDLCALC, TRIG, CHOLHDL, LDLDIRECT in the last 72 hours. Thyroid function studies: No results for input(s): TSH, T4TOTAL, T3FREE, THYROIDAB in the last 72 hours.  Invalid input(s): FREET3 Anemia work up: No results for input(s): VITAMINB12, FOLATE, FERRITIN, TIBC, IRON, RETICCTPCT in the last 72 hours. Sepsis Labs: Recent Labs  Lab 04/01/21 0119 04/01/21 0405 04/01/21 0423 04/01/21 8588 04/02/21 0423 04/03/21 0550 04/04/21 0631  PROCALCITON  --  <0.10  --   --  <0.10 <0.10  --   WBC 18.2*  --   --   --  12.1* 11.3* 9.1  LATICACIDVEN  --   --  1.2 1.2  --   --   --     Microbiology Recent Results (from the past 240 hour(s))  Resp Panel by RT-PCR (Flu A&B, Covid) Nasopharyngeal Swab     Status: None   Collection Time: 03/30/21  8:06 PM   Specimen: Nasopharyngeal Swab; Nasopharyngeal(NP) swabs in vial transport medium  Result Value Ref Range Status   SARS Coronavirus 2 by RT PCR NEGATIVE NEGATIVE Final    Comment: (NOTE) SARS-CoV-2 target nucleic acids are NOT DETECTED.  The SARS-CoV-2 RNA is generally detectable in upper respiratory specimens during the acute phase of infection. The lowest concentration of SARS-CoV-2 viral copies this assay can detect is 138 copies/mL. A negative result does not preclude SARS-Cov-2 infection and should not be used as the sole basis for treatment or other patient management decisions. A negative result may occur with  improper specimen collection/handling,  submission of specimen other than nasopharyngeal swab, presence of viral mutation(s) within the areas targeted by this assay, and inadequate number of viral copies(<138 copies/mL). A negative result must be combined with clinical observations, patient history, and epidemiological information. The expected result is Negative.  Fact Sheet for Patients:  EntrepreneurPulse.com.au  Fact Sheet for Healthcare Providers:  IncredibleEmployment.be  This test is no t yet approved or cleared by the [place] FDA and  has been authorized for detection and/or diagnosis of SARS-CoV-2 by FDA under an Emergency Use Authorization (EUA). This EUA will remain  in effect (meaning this test can be used) for the duration of the COVID-19 declaration under Section 564(b)(1) of the Act, 21 U.S.C.section 360bbb-3(b)(1), unless the authorization is terminated  or revoked sooner.       Influenza A by PCR NEGATIVE NEGATIVE Final   Influenza B by PCR NEGATIVE NEGATIVE Final    Comment: (NOTE) The Xpert Xpress SARS-CoV-2/FLU/RSV plus assay is intended as an aid in the diagnosis of influenza from Nasopharyngeal swab specimens and should not be used as a sole basis for treatment. Nasal washings and aspirates are unacceptable for Xpert Xpress SARS-CoV-2/FLU/RSV testing.  Fact Sheet for Patients: EntrepreneurPulse.com.au  Fact Sheet for Healthcare Providers: IncredibleEmployment.be  This test is not yet approved or cleared by the [place] FDA and has been authorized for detection and/or diagnosis of SARS-CoV-2 by FDA under  an Emergency Use Authorization (EUA). This EUA will remain in effect (meaning this test can be used) for the duration of the COVID-19 declaration under Section 564(b)(1) of the Act, 21 U.S.C. section 360bbb-3(b)(1), unless the authorization is terminated or revoked.  Performed at [facility], [address] [place], [place] [address]   Urine Culture     Status: None   Collection Time: 04/01/21  4:30 AM   Specimen: Urine, Random  Result Value Ref Range Status   Specimen Description   Final    URINE, RANDOM Performed at [facility], [address] [place], [place] [address]    Special Requests   Final    NONE Performed at [facility], [address], [place], [place] [address]    Culture   Final    NO GROWTH Performed at [facility], [place] [address], [place], [place] [address]    Report Status 04/02/2021 FINAL  Final    Procedures and diagnostic studies:  No results found.             LOS: 6 days   Tayvon Culley  [facility]   Pager on [email or web address]. If 7PM-7AM, please contact night-coverage at [email or web address]     04/05/2021, 4:01 PM

## 2021-04-05 NOTE — TOC Progression Note (Signed)
Transition of Care [facility] [place]) - Progression Note    Patient Details  Name: Victoria Esparza MRN: [id number] Date of Birth: Dec 08, 1928  Transition of Care [facility]) CM/SW [place], RN Phone Number: 04/05/2021, 1:17 PM  Clinical Narrative:   Spoke with the daughter [name], reviewed the bed offers and she choise [facility], she gave a new number to reach her as [phone] Notified [facility] that the patient accepted the bed offer, faxed the information to [facility] to start insurance auth         Expected Discharge Plan and Services                                                 Social Determinants of Health (SDOH) Interventions    Readmission Risk Interventions No flowsheet data found.

## 2021-04-05 NOTE — Plan of Care (Signed)
Patient had an uneventful shift. No changes in neurological and neurovascular assessments. Vital signs within normal range. Tried weaning oxygen, same unsuccessful remains on 2L vis [place].Denies any needs at this time. All Safety measures maintained. Care continues.  Problem: Education: Goal: Knowledge of General Education information will improve Description: Including pain rating scale, medication(s)/side effects and non-pharmacologic comfort measures Outcome: Progressing   Problem: Health Behavior/Discharge Planning: Goal: Ability to manage health-related needs will improve Outcome: Progressing   Problem: Clinical Measurements: Goal: Ability to maintain clinical measurements within normal limits will improve Outcome: Progressing Goal: Will remain free from infection Outcome: Progressing Goal: Diagnostic test results will improve Outcome: Progressing Goal: Respiratory complications will improve Outcome: Progressing Goal: Cardiovascular complication will be avoided Outcome: Progressing   Problem: Activity: Goal: Risk for activity intolerance will decrease Outcome: Progressing   Problem: Nutrition: Goal: Adequate nutrition will be maintained Outcome: Progressing   Problem: Coping: Goal: Level of anxiety will decrease Outcome: Progressing   Problem: Elimination: Goal: Will not experience complications related to bowel motility Outcome: Progressing Goal: Will not experience complications related to urinary retention Outcome: Progressing   Problem: Pain Managment: Goal: General experience of comfort will improve Outcome: Progressing   Problem: Safety: Goal: Ability to remain free from injury will improve Outcome: Progressing   Problem: Skin Integrity: Goal: Risk for impaired skin integrity will decrease Outcome: Progressing

## 2021-04-05 NOTE — TOC Progression Note (Signed)
Transition of Care [facility]) - Progression Note    Patient Details  Name: Victoria Esparza MRN: [id number] Date of Birth: 1928-04-30  Transition of Care [facility]) CM/SW [place], RN Phone Number: 04/05/2021, 2:01 PM  Clinical Narrative:   Received a call from [facility] with approval Ref number [id number], Approval dates 4/19 next review date 4/21, Notified Patient's daughter and [facility]         Expected Discharge Plan and Services                                                 Social Determinants of Health (SDOH) Interventions    Readmission Risk Interventions No flowsheet data found.

## 2021-04-05 NOTE — Progress Notes (Signed)
Physical Therapy Treatment Patient Details Name: Victoria Esparza MRN: [id number] DOB: 04-06-1928 Today's Date: 04/05/2021    History of Present Illness 87 y/o female s/p R hip fx with hemi arthroplasty repair 4/13.  Post surgery she had issues with sustained hypotension and was placed in CCU.    PT Comments    Improved alertness and ability to actively participate with session this date.  Orthostatics fully assessed with transition to upright; stable and WFL throughout session.  Able to complete sit/stand, standing and bed/chair with mod assist +2; improved from previous sessions.  Will plan to trial short-distance gait with chair follow next session if appropriate. Of note, continues to require supplemental O2 to maintain sats during session; anticipate need for O2 at discharge.  SaO2 on room air at rest = 92% SaO2 on room air while ambulating = 86% SaO2 on 2 liters of O2 while ambulating = 93%    Follow Up Recommendations  SNF     Equipment Recommendations       Recommendations for Other Services       Precautions / Restrictions Precautions Precautions: Posterior Hip;Fall Restrictions Weight Bearing Restrictions: Yes RLE Weight Bearing: Weight bearing as tolerated    Mobility  Bed Mobility Overal bed mobility: Needs Assistance Bed Mobility: Supine to Sit     Supine to sit: Mod assist          Transfers Overall transfer level: Needs assistance Equipment used: Rolling walker (2 wheeled) Transfers: Sit to/from Stand Sit to Stand: Mod assist;+2 physical assistance         General transfer comment: prefers UE placement on RW to anticipate movement transition; extensive assist fo rlift off and anterior weight translation; very minimal WBing/active use of R LE  Ambulation/Gait Ambulation/Gait assistance: Mod assist;+2 physical assistance Gait Distance (Feet): 4 Feet Assistive device: Rolling walker (2 wheeled)       General Gait Details: very short,  shuffling steps, automatically advances LEs as therapist guides RW to facilitate movement.  Poor dynamic balance, poor overall stability.  Fair loading/stance R LE  However, unsafe to attempt without RW and +2 at this time   Marine scientist [place] (Stroke Patients Only)       Balance Overall balance assessment: Needs assistance Sitting-balance support: No upper extremity supported;Feet supported Sitting balance-Leahy Scale: Fair Sitting balance - Comments: lists posteriorally with divided attention, requires cuing/assist for correction to midline Postural control: Posterior lean Standing balance support: Bilateral upper extremity supported Standing balance-Leahy Scale: Poor                              Cognition Arousal/Alertness: Awake/alert Behavior During Therapy: WFL for tasks assessed/performed Overall Cognitive Status: History of cognitive impairments - at baseline                                 General Comments: Orietned to self only; follows commands, pleasant and cooperative.  Generally unaware of location and situation; unable to recall/adhere to [facility]      Exercises Other Exercises Other Exercises: Orthostatic assessment-see vitals flowsheet for details. Stable and WFL with position change; no reports of orthostatic symptoms. Other Exercises: Bed/chair with RW, mod/max assist +2 for safety    General Comments        Pertinent Vitals/Pain Pain  Assessment: Faces Faces Pain Scale: Hurts little more Pain Location: RLE with movement & weight bearing Pain Descriptors / Indicators: Aching;Grimacing;Guarding Pain Intervention(s): Limited activity within patient's tolerance;Monitored during session;Repositioned    Home Living                      Prior Function            PT Goals (current goals can now be found in the care plan section) Acute Rehab PT Goals Patient Stated Goal: pt  confused, none stated PT Goal Formulation: With patient Time For Goal Achievement: 04/15/21 Potential to Achieve Goals: Fair Progress towards PT goals: Progressing toward goals    Frequency    BID      PT Plan Current plan remains appropriate    Co-evaluation              AM-PAC PT "6 Clicks" Mobility   Outcome Measure  Help needed turning from your back to your side while in a flat bed without using bedrails?: A Lot Help needed moving from lying on your back to sitting on the side of a flat bed without using bedrails?: A Lot Help needed moving to and from a bed to a chair (including a wheelchair)?: A Lot Help needed standing up from a chair using your arms (e.g., wheelchair or bedside chair)?: A Lot Help needed to walk in hospital room?: A Lot Help needed climbing 3-5 steps with a railing? : Total 6 Click Score: 11    End of Session Equipment Utilized During Treatment: Gait belt;Oxygen Activity Tolerance: Patient tolerated treatment well Patient left: in chair;with call bell/phone within reach;with chair alarm set Nurse Communication: Mobility status PT Visit Diagnosis: Muscle weakness (generalized) (M62.81);Difficulty in walking, not elsewhere classified (R26.2);Pain;Unsteadiness on feet (R26.81) Pain - Right/Left: Right Pain - part of body: Hip     Time: 4174-0814 PT Time Calculation (min) (ACUTE ONLY): 24 min  Charges:  $Therapeutic Activity: 23-37 mins                     Damario Gillie H. Owens Shark, PT, DPT, NCS 04/05/21, 1:56 PM [phone]

## 2021-04-06 LAB — BLOOD GAS, VENOUS
Acid-base deficit: 0.9 mmol/L (ref 0.0–2.0)
Bicarbonate: 27.6 mmol/L (ref 20.0–28.0)
O2 Saturation: 56.5 %
Patient temperature: 37
pCO2, Ven: 66 mmHg — ABNORMAL HIGH (ref 44.0–60.0)
pH, Ven: 7.23 — ABNORMAL LOW (ref 7.250–7.430)
pO2, Ven: 36 mmHg (ref 32.0–45.0)

## 2021-04-06 LAB — SARS CORONAVIRUS 2 (TAT 6-24 HRS): SARS Coronavirus 2: NEGATIVE

## 2021-04-06 MED ORDER — COVID-19 MRNA VAC-TRIS(PFIZER) 30 MCG/0.3ML IM SUSP: 0.3000 mL | Freq: Once | INTRAMUSCULAR | Status: DC

## 2021-04-06 MED ORDER — BISACODYL 10 MG RE SUPP
10.0000 mg | Freq: Once | RECTAL | Status: AC
  Administered 2021-04-06: 10 mg via RECTAL
  Filled 2021-04-06: qty 1

## 2021-04-06 NOTE — Progress Notes (Signed)
PT Cancellation Note  Patient Details Name: Victoria Esparza MRN: [id number] DOB: October 05, 1928   Cancelled Treatment:     PT attempt. Pt unable to stay awake long enough to participate. PT has active DC orders and is planning to DC to SNF via EMS later this date.    Willette Pa 04/06/2021, 11:06 AM

## 2021-04-06 NOTE — TOC Progression Note (Signed)
Transition of Care [facility]) - Progression Note    Patient Details  Name: Victoria Esparza MRN: [id number] Date of Birth: 23-Dec-1927  Transition of Care [facility]) CM/SW [place], RN Phone Number: 04/06/2021, 1:02 PM  Clinical Narrative:   Called [facility] and arranged transport to [facility] room [facility]11,  Called [name] the patient's daughter to notify of the transport and the room at [facility]        Expected Discharge Plan and Services           Expected Discharge Date: 04/06/21                                     Social Determinants of Health (SDOH) Interventions    Readmission Risk Interventions No flowsheet data found.

## 2021-04-06 NOTE — Discharge Summary (Addendum)
Physician Discharge Summary  BERNARDINA CACHO DGU:[id number] DOB: 11-01-1928 DOA: 03/30/2021  PCP: Orvis Brill, [facility]  Admit date: 03/30/2021 Discharge date: 04/06/2021  Discharge disposition: SNF   Recommendations for Outpatient Follow-Up:   Follow-up with Dr. Sharlet Salina, orthopedic surgeon, in 1 week. Follow-up with physician at nursing home within 3 days of discharge.   Discharge Diagnosis:   Principal Problem:   Closed displaced fracture of right femoral neck (HCC) Active Problems:   Benign essential HTN   OP (osteoporosis)   Paroxysmal atrial fibrillation (HCC)   Psoriasis with arthropathy (HCC)   Leukocytosis    Discharge Condition: Stable.  Diet recommendation:  Diet Order            Diet - low sodium heart healthy           Diet regular Room service appropriate? Yes; Fluid consistency: Thin  Diet effective now                   Code Status: Full Code     Hospital Course:   Ms. Kortlynn Poust is a 87 year old woman with medical history significant for hypertension, COPD, chronic hypoxic respiratory failure, osteoporosis, paroxysmal atrial fibrillation, depression, hyperlipidemia, diverticulosis, cognitive dysfunction, who presented to the hospital after a fall at the assisted living facility.  Work-up revealed right femoral neck fracture.  She was treated with analgesics.  She was seen in consultation by the orthopedic surgeon, Dr. For follow-up.  She underwent right bipolar hip hemiarthroplasty on 03/31/2021.  She developed refractory hypotension postoperatively.  She was treated with IV fluids.  She was transferred to the ICU for further management.  She required vasopressors for refractory hypotension.  She developed acute blood loss anemia but she did not require any blood transfusion. DVT prophylaxis was discussed with Dr. Sharlet Salina, orthopedic surgeon.  He recommended aspirin 325 mg daily at discharge. I called her daughter, [name], to discuss  the discharge plan.  There was no response so I left a voice message for her to call back.   Medical Consultants:    Orthopedic surgeon, Dr. Sharlet Salina  Intensivist, Dr. Tamala Julian   Discharge Exam:    Vitals:   04/05/21 2035 04/06/21 0019 04/06/21 0352 04/06/21 0839  BP: 112/64 (!) 105/55 130/71 109/63  Pulse: 90 91 82 86  Resp: 17 17 20 14   Temp: (!) 97.5 F (36.4 C) 97.6 F (36.4 C) 98.5 F (36.9 C) 98.4 F (36.9 C)  TempSrc:   Oral Oral  SpO2: 96% 95% 98% 98%  Weight:      Height:         GEN: NAD SKIN: Warm and dry EYES: No pallor or icterus ENT: MMM CV: RRR PULM: CTA B ABD: soft, ND, NT, +BS CNS: AAO x 1 (person), non focal EXT: Dressing on right hip surgical wound is intact.  No edema or tenderness.   The results of significant diagnostics from this hospitalization (including imaging, microbiology, ancillary and laboratory) are listed below for reference.     Procedures and Diagnostic Studies:   DG Chest 1 View  Result Date: 03/30/2021 CLINICAL DATA:  Fall. EXAM: CHEST  1 VIEW COMPARISON:  November 25, 2020. FINDINGS: The heart size and mediastinal contours are within normal limits. Both lungs are clear. The visualized skeletal structures are unremarkable. IMPRESSION: No active disease. Electronically Signed   By: Marijo Conception M.D.   On: 03/30/2021 18:41   CT Head Wo Contrast  Result Date: 03/30/2021 CLINICAL DATA:  Head trauma unwitnessed  fall EXAM: CT HEAD WITHOUT CONTRAST CT CERVICAL SPINE WITHOUT CONTRAST TECHNIQUE: Multidetector CT imaging of the head and cervical spine was performed following the standard protocol without intravenous contrast. Multiplanar CT image reconstructions of the cervical spine were also generated. COMPARISON:  CT brain and cervical spine 01/22/2021 FINDINGS: CT HEAD FINDINGS Brain: No acute territorial infarction, hemorrhage, or intracranial mass. Advanced white matter hypodensity consistent with chronic small vessel ischemic  change. Moderate atrophy. Stable ventricle size. Vascular: No hyperdense vessels.  Carotid vascular calcification Skull: Normal. Negative for fracture or focal lesion. Sinuses/Orbits: No acute finding. Other: None CT CERVICAL SPINE FINDINGS Alignment: Reversal of cervical lordosis. 3 mm anterolisthesis C3 on C4. Trace retrolisthesis C5 on C6. Trace anterolisthesis C7 on T1. These findings are unchanged. Skull base and vertebrae: No acute fracture. No primary bone lesion or focal pathologic process. Soft tissues and spinal canal: No prevertebral fluid or swelling. No visible canal hematoma. Disc levels: Multiple level degenerative change with moderate severe disc space narrowing and degenerative change at C3-C4, C4-C5 and C5-C6. Facet degenerative changes at multiple levels. Upper chest: Apical emphysema. Heterogeneous thyroid with 2.6 cm hypodense nodule. Other: None IMPRESSION: 1. No CT evidence for acute intracranial abnormality. Atrophy and chronic small vessel ischemic changes of the white matter. 2. Reversal of cervical lordosis with degenerative changes. No acute osseous abnormality. 3. Heterogeneous thyroid with 2.6 cm hypodense nodule. In the setting of significant comorbidities or limited life expectancy, no follow-up recommended (ref: J Am Coll Radiol. 2015 Feb;12(2): 143-50). Electronically Signed   By: Donavan Foil M.D.   On: 03/30/2021 18:40   CT Cervical Spine Wo Contrast  Result Date: 03/30/2021 CLINICAL DATA:  Head trauma unwitnessed fall EXAM: CT HEAD WITHOUT CONTRAST CT CERVICAL SPINE WITHOUT CONTRAST TECHNIQUE: Multidetector CT imaging of the head and cervical spine was performed following the standard protocol without intravenous contrast. Multiplanar CT image reconstructions of the cervical spine were also generated. COMPARISON:  CT brain and cervical spine 01/22/2021 FINDINGS: CT HEAD FINDINGS Brain: No acute territorial infarction, hemorrhage, or intracranial mass. Advanced white matter  hypodensity consistent with chronic small vessel ischemic change. Moderate atrophy. Stable ventricle size. Vascular: No hyperdense vessels.  Carotid vascular calcification Skull: Normal. Negative for fracture or focal lesion. Sinuses/Orbits: No acute finding. Other: None CT CERVICAL SPINE FINDINGS Alignment: Reversal of cervical lordosis. 3 mm anterolisthesis C3 on C4. Trace retrolisthesis C5 on C6. Trace anterolisthesis C7 on T1. These findings are unchanged. Skull base and vertebrae: No acute fracture. No primary bone lesion or focal pathologic process. Soft tissues and spinal canal: No prevertebral fluid or swelling. No visible canal hematoma. Disc levels: Multiple level degenerative change with moderate severe disc space narrowing and degenerative change at C3-C4, C4-C5 and C5-C6. Facet degenerative changes at multiple levels. Upper chest: Apical emphysema. Heterogeneous thyroid with 2.6 cm hypodense nodule. Other: None IMPRESSION: 1. No CT evidence for acute intracranial abnormality. Atrophy and chronic small vessel ischemic changes of the white matter. 2. Reversal of cervical lordosis with degenerative changes. No acute osseous abnormality. 3. Heterogeneous thyroid with 2.6 cm hypodense nodule. In the setting of significant comorbidities or limited life expectancy, no follow-up recommended (ref: J Am Coll Radiol. 2015 Feb;12(2): 143-50). Electronically Signed   By: Donavan Foil M.D.   On: 03/30/2021 18:40   DG Pelvis Portable  Result Date: 03/31/2021 CLINICAL DATA:  Postop. EXAM: PORTABLE PELVIS 1-2 VIEWS COMPARISON:  Preoperative imaging yesterday. FINDINGS: Right hip arthroplasty in expected alignment. The distal most femoral stem is not  entirely included in the field of view. There is no periprosthetic lucency or fracture. Recent postsurgical change includes air and edema in the joint space and soft tissues. Lateral skin staples. IMPRESSION: Right hip arthroplasty without immediate postoperative  complication. Electronically Signed   By: Keith Rake M.D.   On: 03/31/2021 19:52   CT Hip Right Wo Contrast  Result Date: 03/30/2021 CLINICAL DATA:  [place]. Right hip fracture. EXAM: CT OF THE RIGHT HIP WITHOUT CONTRAST TECHNIQUE: Multidetector CT imaging of the right hip was performed according to the standard protocol. Multiplanar CT image reconstructions were also generated. COMPARISON:  Radiographs, same date. FINDINGS: There is a mildly impacted high femoral neck/subcapital fracture of the right hip. The acetabulum is intact. The femoral head is normally located. The pubic symphysis and visualized right SI joint are intact. Moderate degenerative changes at the pubic symphysis. No right hemipelvic fractures are identified. The right hip and pelvic musculature are grossly normal. No obvious tear or intramuscular hematoma. No significant right-sided intrapelvic abnormalities. Moderate vascular calcifications. IMPRESSION: 1. Mildly impacted high femoral neck/subcapital fracture of the right hip. 2. No right hemipelvic fractures are identified. Electronically Signed   By: Marijo Sanes M.D.   On: 03/30/2021 20:12   DG Hip Unilat W or Wo Pelvis 2-3 Views Right  Result Date: 03/30/2021 CLINICAL DATA:  Right hip pain after fall. EXAM: DG HIP (WITH OR WITHOUT PELVIS) 2-3V RIGHT COMPARISON:  None. FINDINGS: Mildly displaced proximal right femoral neck fracture is noted. Left hip is unremarkable. IMPRESSION: Mildly displaced proximal right femoral neck fracture. Electronically Signed   By: Marijo Conception M.D.   On: 03/30/2021 18:40     Labs:   Basic Metabolic Panel: Recent Labs  Lab 03/31/21 0449 04/01/21 0119 04/02/21 0423 04/03/21 0550 04/04/21 0631  NA 135 137 139 138 139  K 4.1 4.1 4.3 4.1 4.1  CL 100 104 107 106 104  CO2 28 27 27 27 28   GLUCOSE 91 75 99 90 89  BUN 17 23 22 23 19   CREATININE 1.05* 0.87 0.74 0.56 0.57  CALCIUM 8.5* 7.9* 7.8* 8.3* 8.3*   GFR Estimated Creatinine  Clearance: 40.4 mL/min (by C-G formula based on SCr of 0.57 mg/dL). Liver Function Tests: Recent Labs  Lab 03/30/21 1933  AST 22  ALT 13  ALKPHOS 61  BILITOT 0.6  PROT 6.8  ALBUMIN 3.3*   No results for input(s): LIPASE, AMYLASE in the last 168 hours. No results for input(s): AMMONIA in the last 168 hours. Coagulation profile No results for input(s): INR, PROTIME in the last 168 hours.  CBC: Recent Labs  Lab 03/30/21 1933 03/31/21 0449 04/01/21 0119 04/01/21 0658 04/01/21 1301 04/01/21 1852 04/02/21 0423 04/03/21 0550 04/04/21 0631  WBC 20.7* 19.0* 18.2*  --   --   --  12.1* 11.3* 9.1  NEUTROABS 16.7*  --  13.2*  --   --   --   --   --   --   HGB 13.0 11.8* 9.6*   < > 9.6* 9.7* 8.9* 8.4* 8.0*  HCT 40.1 36.2 30.5*   < > 29.9* 30.5* 28.2* 25.9* 25.0*  MCV 90.9 90.7 94.4  --   --   --  92.5 91.2 92.3  PLT 161 149* 121*  --   --   --  144* 162 159   < > = values in this interval not displayed.   Cardiac Enzymes: No results for input(s): CKTOTAL, CKMB, CKMBINDEX, TROPONINI in the last 168 hours.  BNP: Invalid input(s): POCBNP CBG: Recent Labs  Lab 04/04/21 1657 04/04/21 2040 04/05/21 0031 04/05/21 0351 04/05/21 0815  GLUCAP 94 105* 132* 165* 95   D-Dimer No results for input(s): DDIMER in the last 72 hours. Hgb A1c No results for input(s): HGBA1C in the last 72 hours. Lipid Profile No results for input(s): CHOL, HDL, LDLCALC, TRIG, CHOLHDL, LDLDIRECT in the last 72 hours. Thyroid function studies No results for input(s): TSH, T4TOTAL, T3FREE, THYROIDAB in the last 72 hours.  Invalid input(s): FREET3 Anemia work up No results for input(s): VITAMINB12, FOLATE, FERRITIN, TIBC, IRON, RETICCTPCT in the last 72 hours. Microbiology Recent Results (from the past 240 hour(s))  Resp Panel by RT-PCR (Flu A&B, Covid) Nasopharyngeal Swab     Status: None   Collection Time: 03/30/21  8:06 PM   Specimen: Nasopharyngeal Swab; Nasopharyngeal(NP) swabs in vial transport  medium  Result Value Ref Range Status   SARS Coronavirus 2 by RT PCR NEGATIVE NEGATIVE Final    Comment: (NOTE) SARS-CoV-2 target nucleic acids are NOT DETECTED.  The SARS-CoV-2 RNA is generally detectable in upper respiratory specimens during the acute phase of infection. The lowest concentration of SARS-CoV-2 viral copies this assay can detect is 138 copies/mL. A negative result does not preclude SARS-Cov-2 infection and should not be used as the sole basis for treatment or other patient management decisions. A negative result may occur with  improper specimen collection/handling, submission of specimen other than nasopharyngeal swab, presence of viral mutation(s) within the areas targeted by this assay, and inadequate number of viral copies(<138 copies/mL). A negative result must be combined with clinical observations, patient history, and epidemiological information. The expected result is Negative.  Fact Sheet for Patients:  EntrepreneurPulse.com.au  Fact Sheet for Healthcare Providers:  IncredibleEmployment.be  This test is no t yet approved or cleared by the [place] FDA and  has been authorized for detection and/or diagnosis of SARS-CoV-2 by FDA under an Emergency Use Authorization (EUA). This EUA will remain  in effect (meaning this test can be used) for the duration of the COVID-19 declaration under Section 564(b)(1) of the Act, 21 U.S.C.section 360bbb-3(b)(1), unless the authorization is terminated  or revoked sooner.       Influenza A by PCR NEGATIVE NEGATIVE Final   Influenza B by PCR NEGATIVE NEGATIVE Final    Comment: (NOTE) The Xpert Xpress SARS-CoV-2/FLU/RSV plus assay is intended as an aid in the diagnosis of influenza from Nasopharyngeal swab specimens and should not be used as a sole basis for treatment. Nasal washings and aspirates are unacceptable for Xpert Xpress SARS-CoV-2/FLU/RSV testing.  Fact Sheet for  Patients: EntrepreneurPulse.com.au  Fact Sheet for Healthcare Providers: IncredibleEmployment.be  This test is not yet approved or cleared by the [place] FDA and has been authorized for detection and/or diagnosis of SARS-CoV-2 by FDA under an Emergency Use Authorization (EUA). This EUA will remain in effect (meaning this test can be used) for the duration of the COVID-19 declaration under Section 564(b)(1) of the Act, 21 U.S.C. section 360bbb-3(b)(1), unless the authorization is terminated or revoked.  Performed at [facility], [address], [place], [place] [address]   Urine Culture     Status: None   Collection Time: 04/01/21  4:30 AM   Specimen: Urine, Random  Result Value Ref Range Status   Specimen Description   Final    URINE, RANDOM Performed at [facility], [address], [place], [place] [address]    Special Requests   Final  NONE Performed at [facility], [address], [place], [place] [address]    Culture   Final    NO GROWTH Performed at [facility], [place] [address] [place], [place] [address]    Report Status 04/02/2021 FINAL  Final  SARS CORONAVIRUS 2 (TAT 6-24 HRS) Nasopharyngeal Nasopharyngeal Swab     Status: None   Collection Time: 04/05/21  2:40 PM   Specimen: Nasopharyngeal Swab  Result Value Ref Range Status   SARS Coronavirus 2 NEGATIVE NEGATIVE Final    Comment: (NOTE) SARS-CoV-2 target nucleic acids are NOT DETECTED.  The SARS-CoV-2 RNA is generally detectable in upper and lower respiratory specimens during the acute phase of infection. Negative results do not preclude SARS-CoV-2 infection, do not rule out co-infections with other pathogens, and should not be used as the sole basis for treatment or other patient management decisions. Negative results must be combined with clinical observations, patient history, and epidemiological information. The  expected result is Negative.  Fact Sheet for Patients: SugarRoll.be  Fact Sheet for Healthcare Providers: https://www.woods-mathews.com/  This test is not yet approved or cleared by the [place] FDA and  has been authorized for detection and/or diagnosis of SARS-CoV-2 by FDA under an Emergency Use Authorization (EUA). This EUA will remain  in effect (meaning this test can be used) for the duration of the COVID-19 declaration under Se ction 564(b)(1) of the Act, 21 U.S.C. section 360bbb-3(b)(1), unless the authorization is terminated or revoked sooner.  Performed at [facility], [place] [address], [place], [place] [address]      Discharge Instructions:   Discharge Instructions    Amb Referral to Palliative Care   Complete by: As directed    Diet - low sodium heart healthy   Complete by: As directed    Discharge wound care:   Complete by: As directed    Follow up with Dr/ Sharlet Salina, orthopedic surgeon, in 1 week to remove dressing   Increase activity slowly   Complete by: As directed      Allergies as of 04/06/2021   No Known Allergies     Medication List    TAKE these medications   acetaminophen 325 MG tablet Commonly known as: TYLENOL Take 650 mg by mouth every 4 (four) hours as needed.   alendronate 70 MG tablet Commonly known as: FOSAMAX Take 70 mg by mouth once a week. Reported on 01/22/2016   aspirin 325 MG EC tablet Take 325 mg by mouth daily.   Boudreauxs Butt Paste 16 % Oint Generic drug: Zinc Oxide Apply topically 2 (two) times daily as needed (redness). Apply to sacrum   calcium-vitamin D 500-400 MG-UNIT tablet Commonly known as: OSCAL-500 Take 1 tablet by mouth daily.   citalopram 10 MG tablet Commonly known as: CELEXA Take 10 mg by mouth daily.   Enbrel SureClick 50 MG/ML injection Generic drug: etanercept Inject 50 mg into the muscle once a week.   Fish Oil 500 MG Caps Take 500 mg by mouth  daily.   folic acid 1 MG tablet Commonly known as: FOLVITE Take 5 mg by mouth every Tuesday.   gabapentin 300 MG capsule Commonly known as: NEURONTIN Take 300 mg by mouth at bedtime.   Methotrexate (PF) 25 MG/0.4ML Soaj Inject 0.8 mLs into the skin once a week. Monday 0800   metoprolol tartrate 25 MG tablet Commonly known as: LOPRESSOR Take 25 mg by mouth 2 (two) times daily.   mirtazapine 7.5 MG tablet Commonly  known as: REMERON Take 7.5 mg by mouth at bedtime.   senna-docusate 8.6-50 MG tablet Commonly known as: Senokot-S Take 2 tablets by mouth 2 (two) times daily.   vitamin B-12 1000 MCG tablet Commonly known as: CYANOCOBALAMIN Take 1,000 mcg by mouth daily.            Discharge Care Instructions  (From admission, onward)         Start     Ordered   04/06/21 0000  Discharge wound care:       Comments: Follow up with Dr/ Sharlet Salina, orthopedic surgeon, in 1 week to remove dressing   04/06/21 1058          Contact information for follow-up providers    Renee Harder, MD. Schedule an appointment as soon as possible for a visit in 1 week(s).   Specialty: Orthopedic Surgery Contact information: [place] [address] [phone]            Contact information for after-discharge care    Destination    [facility] Preferred SNF .   Service: Skilled Nursing Contact information: [address] [place] [phone]                   Time coordinating discharge: 33 minutes  Signed:  Jennye Boroughs  [facility] 04/06/2021, 10:58 AM   Pager on [email or web address]. If 7PM-7AM, please contact night-coverage at [email or web address]

## 2021-04-06 NOTE — Plan of Care (Signed)
  Problem: Education: Goal: Knowledge of General Education information will improve Description: Including pain rating scale, medication(s)/side effects and non-pharmacologic comfort measures Outcome: Progressing   Problem: Health Behavior/Discharge Planning: Goal: Ability to manage health-related needs will improve Outcome: Progressing   Problem: Clinical Measurements: Goal: Ability to maintain clinical measurements within normal limits will improve Outcome: Progressing Goal: Will remain free from infection Outcome: Progressing Goal: Diagnostic test results will improve Outcome: Progressing Goal: Respiratory complications will improve Outcome: Progressing Goal: Cardiovascular complication will be avoided Outcome: Progressing   Problem: Activity: Goal: Risk for activity intolerance will decrease Outcome: Progressing   Problem: Nutrition: Goal: Adequate nutrition will be maintained Outcome: Progressing   Problem: Coping: Goal: Level of anxiety will decrease Outcome: Progressing   Problem: Pain Managment: Goal: General experience of comfort will improve Outcome: Progressing   

## 2021-05-11 ENCOUNTER — Other Ambulatory Visit: Payer: Self-pay

## 2021-05-11 ENCOUNTER — Emergency Department: Payer: Medicare HMO

## 2021-05-11 ENCOUNTER — Emergency Department
Admission: EM | Admit: 2021-05-11 | Discharge: 2021-05-11 | Disposition: A | Discharge status: Skilled Nursing Facility | Payer: Medicare HMO | Attending: Emergency Medicine | Admitting: Emergency Medicine

## 2021-05-11 DIAGNOSIS — Z7982 Long term (current) use of aspirin: Secondary | ICD-10-CM | POA: Insufficient documentation

## 2021-05-11 DIAGNOSIS — F0390 Unspecified dementia without behavioral disturbance: Secondary | ICD-10-CM | POA: Diagnosis present

## 2021-05-11 DIAGNOSIS — I10 Essential (primary) hypertension: Secondary | ICD-10-CM | POA: Diagnosis not present

## 2021-05-11 DIAGNOSIS — Z79899 Other long term (current) drug therapy: Secondary | ICD-10-CM | POA: Diagnosis not present

## 2021-05-11 DIAGNOSIS — W19XXXA Unspecified fall, initial encounter: Secondary | ICD-10-CM | POA: Diagnosis not present

## 2021-05-11 DIAGNOSIS — Z87891 Personal history of nicotine dependence: Secondary | ICD-10-CM | POA: Diagnosis not present

## 2021-05-11 DIAGNOSIS — J449 Chronic obstructive pulmonary disease, unspecified: Secondary | ICD-10-CM | POA: Diagnosis not present

## 2021-05-11 LAB — CBC
HCT: 35.4 % — ABNORMAL LOW (ref 36.0–46.0)
Hemoglobin: 11.2 g/dL — ABNORMAL LOW (ref 12.0–15.0)
MCH: 30.4 pg (ref 26.0–34.0)
MCHC: 31.6 g/dL (ref 30.0–36.0)
MCV: 95.9 fL (ref 80.0–100.0)
Platelets: 191 10*3/uL (ref 150–400)
RBC: 3.69 MIL/uL — ABNORMAL LOW (ref 3.87–5.11)
RDW: 15.9 % — ABNORMAL HIGH (ref 11.5–15.5)
WBC: 9.4 10*3/uL (ref 4.0–10.5)
nRBC: 0 % (ref 0.0–0.2)

## 2021-05-11 LAB — BASIC METABOLIC PANEL
Anion gap: 10 (ref 5–15)
BUN: 19 mg/dL (ref 8–23)
CO2: 31 mmol/L (ref 22–32)
Calcium: 10.1 mg/dL (ref 8.9–10.3)
Chloride: 96 mmol/L — ABNORMAL LOW (ref 98–111)
Creatinine, Ser: 0.64 mg/dL (ref 0.44–1.00)
GFR, Estimated: >60 mL/min (ref >60)
Glucose, Bld: 98 mg/dL (ref 70–99)
Potassium: 4.4 mmol/L (ref 3.5–5.1)
Sodium: 137 mmol/L (ref 135–145)

## 2021-05-11 NOTE — ED Triage Notes (Signed)
Pt comes via EMS from [facility] with c/o fall. Pt has hx of dementia.  Pt states right sided pain in hip. Pt unsure how she fell.  Pt on O2 at this time. Pt states she wears 2L.

## 2021-05-11 NOTE — ED Provider Notes (Addendum)
[facility] Emergency Department Provider Note   ____________________________________________   Event Date/Time   First MD Initiated Contact with Patient 05/11/21 1825     (approximate)  I have reviewed the triage vital signs and the nursing notes.   HISTORY  Chief Complaint Fall    HPI Victoria Esparza is a 87 y.o. female with past medical history of dementia, hypertension, paroxysmal atrial fibrillation, and COPD on 2 L who presents to the ED for fall.  History is limited due to patient's dementia.  Per EMS, patient had an unwitnessed fall at her med in [place], it is unknown what caused patient to fall.  Patient is reportedly at her baseline mental status, but she is not sure why she is here in the hospital.  She currently denies any complaints, had initially reported pain in her right hip but now denies this.  She is not sure whether she hit her head or lost consciousness, she does not take any blood thinners currently.        Past Medical History:  Diagnosis Date  . A-fib ([place])   . Arthritis   . Cognitive dysfunction   . Colon adenoma   . COPD (chronic obstructive pulmonary disease) ([place])   . Depression   . Familial tremor   . HLD (hyperlipidemia)   . Hypertension   . Osteoarthritis   . Osteoporosis   . Ovarian cyst   . Psoriatic arthropathy ([place])   . Rotator cuff syndrome   . Sigmoid diverticulitis     Patient Active Problem List   Diagnosis Date Noted  . Closed displaced fracture of right femoral neck ([place]) 03/30/2021  . Leukocytosis 03/30/2021  . Cognitive impairment 06/01/2019  . Psoriasis with arthropathy ([place]) 01/05/2016  . Benign essential HTN 09/08/2015  . Paroxysmal atrial fibrillation ([place]) 08/31/2015  . OP (osteoporosis) 04/20/2015    Past Surgical History:  Procedure Laterality Date  . ABDOMINAL HYSTERECTOMY    . COLONOSCOPY    . HIP ARTHROPLASTY Right 03/31/2021   Procedure: ARTHROPLASTY BIPOLAR HIP  (HEMIARTHROPLASTY);  Surgeon: Renee Harder, MD;  Location: [facility] ORS;  Service: Orthopedics;  Laterality: Right;    Prior to Admission medications   Medication Sig Start Date End Date Taking? Authorizing Provider  acetaminophen (TYLENOL) 325 MG tablet Take 650 mg by mouth every 4 (four) hours as needed.    [provider]  alendronate (FOSAMAX) 70 MG tablet Take 70 mg by mouth once a week. Reported on 01/22/2016    [provider]  aspirin 325 MG EC tablet Take 325 mg by mouth daily.    [provider]  calcium-vitamin D (OSCAL-500) 500-400 MG-UNIT tablet Take 1 tablet by mouth daily.    [provider]  citalopram (CELEXA) 10 MG tablet Take 10 mg by mouth daily.    [provider]  ENBREL SURECLICK 50 MG/ML injection Inject 50 mg into the muscle once a week. 08/21/19   [provider]  folic acid (FOLVITE) 1 MG tablet Take 5 mg by mouth every Tuesday.    [provider]  gabapentin (NEURONTIN) 300 MG capsule Take 300 mg by mouth at bedtime. 08/17/19   [provider]  Methotrexate, PF, 25 MG/0.4ML SOAJ Inject 0.8 mLs into the skin once a week. Monday 0800    [provider]  metoprolol tartrate (LOPRESSOR) 25 MG tablet Take 25 mg by mouth 2 (two) times daily.    [provider]  mirtazapine (REMERON) 7.5 MG  tablet Take 7.5 mg by mouth at bedtime.    [provider]  Omega-3 Fatty Acids (FISH OIL) 500 MG CAPS Take 500 mg by mouth daily.    [provider]  senna-docusate (SENOKOT-S) 8.6-50 MG tablet Take 2 tablets by mouth 2 (two) times daily. 05/14/20   Carrie Mew, MD  vitamin B-12 (CYANOCOBALAMIN) 1000 MCG tablet Take 1,000 mcg by mouth daily.    [provider]  Zinc Oxide (BOUDREAUXS BUTT PASTE) 16 % OINT Apply topically 2 (two) times daily as needed (redness). Apply to sacrum    [provider]    Allergies Patient has no known allergies.  Family History   Problem Relation Age of Onset  . Diabetes Mother   . Ovarian cancer Mother   . Lung cancer Father   . Aneurysm Brother   . Diabetes Brother   . Breast cancer Sister   . Colon cancer Sister   . Diabetes Sister   . Kidney disease Other        nephew  . Bladder Cancer Neg Hx     Social History Social History   Tobacco Use  . Smoking status: Former Smoker    Quit date: 05/26/1995    Years since quitting: 25.9  . Smokeless tobacco: Never Used  Substance Use Topics  . Alcohol use: No  . Drug use: Never    Review of Systems  Constitutional: No fever/chills.  Positive for fall. Eyes: No visual changes. ENT: No sore throat. Cardiovascular: Denies chest pain. Respiratory: Denies shortness of breath. Gastrointestinal: No abdominal pain.  No nausea, no vomiting.  No diarrhea.  No constipation. Genitourinary: Negative for dysuria. Musculoskeletal: Negative for back pain. Skin: Negative for rash. Neurological: Negative for headaches, focal weakness or numbness.  ____________________________________________   PHYSICAL EXAM:  VITAL SIGNS: ED Triage Vitals [05/11/21 1506]  Enc Vitals Group     BP (!) 154/102     Pulse Rate 83     Resp 18     Temp 98 F (36.7 C)     Temp src      SpO2 97 %     Weight      Height      Head Circumference      Peak Flow      Pain Score 0     Pain Loc      Pain Edu?      Excl. in [place]?     Constitutional: Awake and alert. Eyes: Conjunctivae are normal. Head: Atraumatic. Nose: No congestion/rhinnorhea. Mouth/Throat: Mucous membranes are moist. Neck: Normal ROM, no midline cervical spine tenderness. Cardiovascular: Normal rate, regular rhythm. Grossly normal heart sounds. Respiratory: Normal respiratory effort.  No retractions. Lungs CTAB. Gastrointestinal: Soft and nontender. No distention. Genitourinary: deferred Musculoskeletal: No lower extremity tenderness nor edema.  No bony tenderness to bilateral upper extremities.  No hip  tenderness bilaterally.  Range of motion intact to bilateral lower extremities without pain. Neurologic:  Normal speech and language. No gross focal neurologic deficits are appreciated. Skin:  Skin is warm, dry and intact. No rash noted. Psychiatric: Mood and affect are normal. Speech and behavior are normal.  ____________________________________________   LABS (all labs ordered are listed, but only abnormal results are displayed)  Labs Reviewed  BASIC METABOLIC PANEL - Abnormal; Notable for the following components:      Result Value   Ch[place]ide 96 (*)    All other components within normal limits  CBC - Abnormal; Notable for the following components:  RBC 3.69 (*)    Hemoglobin 11.2 (*)    HCT 35.4 (*)    RDW 15.9 (*)    All other components within normal limits  URINALYSIS, COMPLETE (UACMP) WITH MICROSCOPIC  CBG MONITORING, ED   ____________________________________________  EKG  ED ECG REPORT I, Blake Divine, the attending physician, personally viewed and interpreted this ECG.   Date: 05/11/2021  EKG Time: 16:04  Rate: 105  Rhythm: atrial fibrillation  Axis: Normal  Intervals:none  ST&T Change: None   PROCEDURES  Procedure(s) performed (including Critical Care):  Procedures   ____________________________________________   INITIAL IMPRESSION / ASSESSMENT AND PLAN / ED COURSE       87 year old female with past medical history of dementia, hypertension, paroxysmal atrial fibrillation, and COPD on 2 L who presents to the ED following unwitnessed fall at her nursing facility.  Patient does not remember why she is here and currently denies any complaints.  She is reportedly at her baseline mental status and has no focal neurologic deficits.  EKG and labs are unremarkable.  CT imaging of head and cervical spine are negative for acute process.  She had initially reported right hip pain, but now denies this and has full range of motion in her right hip without pain.   No evidence of bony injury to her extremities.  She is appropriate for discharge back to nursing facility, patient agrees with plan.  ----------------------------------------- 7:49 PM on 05/11/2021 -----------------------------------------  While awaiting transport back to her nursing facility, patient got up out of bed and attempted to walk out of her room.  She leaned up against the linen receptacle which gave way, causing her to fall to the ground.  She hit her head on the wall but did not lose consciousness, subsequently landed on her buttocks.  On reassessment she has no midline cervical spine tenderness, continues to have full range of motion of all 4 extremities with no bony tenderness.  We will repeat CT head and if this is unremarkable, she would again to be appropriate for discharge back to nursing facility.  We will move patient to area of the department where she may be constantly monitored.  Patient turned over to oncoming provider pending repeat head CT.      ____________________________________________   FINAL CLINICAL IMPRESSION(S) / ED DIAGNOSES  Final diagnoses:  Fall, initial encounter  Dementia without behavioral disturbance, unspecified dementia type [facility])     ED Discharge Orders    None       Note:  This document was prepared using Dragon voice recognition software and may include unintentional dictation errors.   Blake Divine, MD 05/11/21 Remus Loffler    Blake Divine, MD 05/11/21 [id number]

## 2021-05-11 NOTE — ED Notes (Signed)
Called EMS for transport back to [facility]

## 2021-06-27 ENCOUNTER — Emergency Department: Payer: Medicare HMO

## 2021-06-27 ENCOUNTER — Other Ambulatory Visit: Payer: Self-pay

## 2021-06-27 ENCOUNTER — Emergency Department
Admission: EM | Admit: 2021-06-27 | Discharge: 2021-06-27 | Disposition: A | Discharge status: Home / Self Care | Payer: Medicare HMO | Attending: Emergency Medicine | Admitting: Emergency Medicine

## 2021-06-27 DIAGNOSIS — Z87891 Personal history of nicotine dependence: Secondary | ICD-10-CM | POA: Diagnosis not present

## 2021-06-27 DIAGNOSIS — Z8679 Personal history of other diseases of the circulatory system: Secondary | ICD-10-CM | POA: Insufficient documentation

## 2021-06-27 DIAGNOSIS — Z79899 Other long term (current) drug therapy: Secondary | ICD-10-CM | POA: Insufficient documentation

## 2021-06-27 DIAGNOSIS — F0390 Unspecified dementia without behavioral disturbance: Secondary | ICD-10-CM | POA: Diagnosis not present

## 2021-06-27 DIAGNOSIS — Z7982 Long term (current) use of aspirin: Secondary | ICD-10-CM | POA: Insufficient documentation

## 2021-06-27 DIAGNOSIS — S3992XA Unspecified injury of lower back, initial encounter: Secondary | ICD-10-CM | POA: Diagnosis present

## 2021-06-27 DIAGNOSIS — Z96641 Presence of right artificial hip joint: Secondary | ICD-10-CM | POA: Insufficient documentation

## 2021-06-27 DIAGNOSIS — I10 Essential (primary) hypertension: Secondary | ICD-10-CM | POA: Diagnosis not present

## 2021-06-27 DIAGNOSIS — J449 Chronic obstructive pulmonary disease, unspecified: Secondary | ICD-10-CM | POA: Diagnosis not present

## 2021-06-27 DIAGNOSIS — W19XXXA Unspecified fall, initial encounter: Secondary | ICD-10-CM

## 2021-06-27 DIAGNOSIS — R519 Headache, unspecified: Secondary | ICD-10-CM | POA: Diagnosis not present

## 2021-06-27 DIAGNOSIS — Z85038 Personal history of other malignant neoplasm of large intestine: Secondary | ICD-10-CM | POA: Insufficient documentation

## 2021-06-27 DIAGNOSIS — S300XXA Contusion of lower back and pelvis, initial encounter: Secondary | ICD-10-CM | POA: Diagnosis not present

## 2021-06-27 NOTE — ED Provider Notes (Signed)
[facility] EMERGENCY DEPARTMENT Provider Note   CSN: [id number] Arrival date & time: 06/27/21  1451     History Chief Complaint  Patient presents with   Lytle Michaels    Victoria Esparza is a 87 y.o. female with a history of atrial fibrillation, dementia, hypertension, osteoporosis presents from a skilled nursing facility for evaluation of of fall.  Reports from skilled nursing facility state patient was transferring to wheelchair and sat down on the floor accidentally.  States she hit her head but is not complaining of a headache.  She is mostly complaining of tailbone pain and lower back pain.  No numbness tingling radicular symptoms.  No groin or thigh pain.  She denies any chest pain, shortness of breath, nausea, vomiting, numbness tingling in the upper or lower extremities.  Patient takes aspirin daily.  HPI     Past Medical History:  Diagnosis Date   A-fib [facility])    Arthritis    Cognitive dysfunction    Colon adenoma    COPD (chronic obstructive pulmonary disease) ([facility])    Depression    Familial tremor    HLD (hyperlipidemia)    Hypertension    Osteoarthritis    Osteoporosis    Ovarian cyst    Psoriatic arthropathy ([place])    Rotator cuff syndrome    Sigmoid diverticulitis     Patient Active Problem List   Diagnosis Date Noted   Closed displaced fracture of right femoral neck ([place]) 03/30/2021   Leukocytosis 03/30/2021   Cognitive impairment 06/01/2019   Psoriasis with arthropathy ([place]) 01/05/2016   Benign essential HTN 09/08/2015   Paroxysmal atrial fibrillation ([place]) 08/31/2015   OP (osteoporosis) 04/20/2015    Past Surgical History:  Procedure Laterality Date   ABDOMINAL HYSTERECTOMY     COLONOSCOPY     HIP ARTHROPLASTY Right 03/31/2021   Procedure: ARTHROPLASTY BIPOLAR HIP (HEMIARTHROPLASTY);  Surgeon: Renee Harder, MD;  Location: [facility] ORS;  Service: Orthopedics;  Laterality: Right;     OB History   No obstetric history on file.      Family History  Problem Relation Age of Onset   Diabetes Mother    Ovarian cancer Mother    Lung cancer Father    Aneurysm Brother    Diabetes Brother    Breast cancer Sister    Colon cancer Sister    Diabetes Sister    Kidney disease Other        nephew   Bladder Cancer Neg Hx     Social History   Tobacco Use   Smoking status: Former    Pack years: 0.00    Types: Cigarettes    Quit date: 05/26/1995    Years since quitting: 26.1   Smokeless tobacco: Never  Substance Use Topics   Alcohol use: No   Drug use: Never    Home Medications Prior to Admission medications   Medication Sig Start Date End Date Taking? Authorizing Provider  acetaminophen (TYLENOL) 325 MG tablet Take 650 mg by mouth every 4 (four) hours as needed.    [provider]  alendronate (FOSAMAX) 70 MG tablet Take 70 mg by mouth once a week. Reported on 01/22/2016    [provider]  aspirin 325 MG EC tablet Take 325 mg by mouth daily.    [provider]  calcium-vitamin D (OSCAL-500) 500-400 MG-UNIT tablet Take 1 tablet by mouth daily.    [provider]  citalopram (CELEXA) 10 MG tablet Take 10 mg by mouth daily.  [provider]  ENBREL SURECLICK 50 MG/ML injection Inject 50 mg into the muscle once a week. 08/21/19   [provider]  folic acid (FOLVITE) 1 MG tablet Take 5 mg by mouth every Tuesday.    [provider]  gabapentin (NEURONTIN) 300 MG capsule Take 300 mg by mouth at bedtime. 08/17/19   [provider]  Methotrexate, PF, 25 MG/0.4ML SOAJ Inject 0.8 mLs into the skin once a week. Monday 0800    [provider]  metoprolol tartrate (LOPRESSOR) 25 MG tablet Take 25 mg by mouth 2 (two) times daily.    [provider]  mirtazapine (REMERON) 7.5 MG tablet Take 7.5 mg by mouth at bedtime.    [provider]  Omega-3 Fatty Acids (FISH OIL) 500 MG CAPS Take 500 mg by mouth daily.    [provider]   senna-docusate (SENOKOT-S) 8.6-50 MG tablet Take 2 tablets by mouth 2 (two) times daily. 05/14/20   Carrie Mew, MD  vitamin B-12 (CYANOCOBALAMIN) 1000 MCG tablet Take 1,000 mcg by mouth daily.    [provider]  Zinc Oxide (BOUDREAUXS BUTT PASTE) 16 % OINT Apply topically 2 (two) times daily as needed (redness). Apply to sacrum    [provider]    Allergies    Patient has no known allergies.  Review of Systems   Review of Systems  Constitutional:  Negative for chills, fatigue and fever.  Respiratory:  Negative for shortness of breath.   Cardiovascular:  Negative for chest pain.  Gastrointestinal:  Negative for nausea and vomiting.  Musculoskeletal:  Positive for back pain. Negative for neck pain.  Skin:  Negative for wound.  Neurological:  Negative for dizziness, light-headedness and headaches.   Physical Exam Updated Vital Signs BP 133/83   Pulse 85   Temp 98.8 F (37.1 C) (Oral)   Resp 18   Ht 5\' 4"  (1.626 m)   Wt 54.4 kg   SpO2 99%   BMI 20.60 kg/m   Physical Exam Constitutional:      Appearance: Normal appearance. She is well-developed.  HENT:     Head: Normocephalic and atraumatic.     Right Ear: External ear normal.     Left Ear: External ear normal.     Nose: Nose normal.  Eyes:     Extraocular Movements: Extraocular movements intact.     Conjunctiva/sclera: Conjunctivae normal.     Pupils: Pupils are equal, round, and reactive to light.  Neck:     Comments: Normal neck range of motion with no signs of discomfort.  No tenderness to palpation. Cardiovascular:     Rate and Rhythm: Normal rate.  Pulmonary:     Effort: Pulmonary effort is normal. No respiratory distress.  Abdominal:     General: Bowel sounds are normal. There is no distension.     Palpations: Abdomen is soft.     Tenderness: There is no abdominal tenderness. There is no guarding.  Musculoskeletal:        General: Tenderness present. No swelling or deformity. Normal  range of motion.     Cervical back: Normal range of motion and neck supple. No rigidity or tenderness.     Comments: Positive tenderness along the lower lumbar spinous process and sacral region.  Nontender to palpation of the lower extremities.  Able to flex both hips to 90 degrees, today both hips with no signs of discomfort.  Leg lengths are equal.  She is nontender to palpation both knees, ankles.  She has no tenderness palpation along the shoulders elbows wrist or digits.  No thoracic or cervical spinous process tenderness.  Skin:    General: Skin is warm.     Findings: No rash.  Neurological:     Mental Status: She is alert and oriented to person, place, and time.  Psychiatric:        Behavior: Behavior normal.        Thought Content: Thought content normal.    ED Results / Procedures / Treatments   Labs (all labs ordered are listed, but only abnormal results are displayed) Labs Reviewed - No data to display  EKG None  Radiology DG Lumbar Spine 2-3 Views  Result Date: 06/27/2021 CLINICAL DATA:  Pain after fall EXAM: LUMBAR SPINE - 2-3 VIEW COMPARISON:  CT abdomen Apr 24, 2018. FINDINGS: Five lumbar type vertebral bodies. Similar appearance of the chronic L1 superior endplate compression deformity with approximately 30% height loss. There is no evidence of new lumbar spine fracture. Mild levoconvex curvature of the spine. Multilevel degenerative changes spine most notably at L2-L3 where it is severe. Aortic atherosclerosis. IMPRESSION: 1. No evidence of new lumbar spine fracture. 2. Similar appearance of the chronic L1 superior endplate compression deformity. 3. Multilevel degenerative changes, most notably at L2-L3. Electronically Signed   By: Dahlia Bailiff MD   On: 06/27/2021 17:09   DG Pelvis 1-2 Views  Result Date: 06/27/2021 CLINICAL DATA:  Pain after fall EXAM: PELVIS - 1-2 VIEW COMPARISON:  March 31, 2021 FINDINGS: Right total hip arthroplasty without evidence of loosening or  periprosthetic fracture. There is no evidence of acute pelvic fracture or diastasis. Left hip is unremarkable. Pelvic phleboliths. Lower lumbar spondylosis. IMPRESSION: No acute osseous abnormality. Right total hip arthroplasty without evidence of loosening or periprosthetic fracture. Electronically Signed   By: Dahlia Bailiff MD   On: 06/27/2021 17:06   CT Head Wo Contrast  Result Date: 06/27/2021 CLINICAL DATA:  [place].  Dementia. EXAM: CT HEAD WITHOUT CONTRAST TECHNIQUE: Contiguous axial images were obtained from the base of the skull through the vertex without intravenous contrast. COMPARISON:  05/11/2021 FINDINGS: Brain: No significant change in mild-to-moderate enlargement of the ventricles and subarachnoid spaces. No intracranial hemorrhage, mass lesion or CT evidence of acute infarction. Vascular: No hyperdense vessel or unexpected calcification. Skull: Normal. Negative for fracture or focal lesion. Sinuses/Orbits: Status post bilateral cataract extraction. Bilateral ethmoid sinus mucosal thickening. Other: None. IMPRESSION: 1. No acute abnormality. 2. Stable atrophy and chronic small vessel white matter ischemic changes. Electronically Signed   By: Claudie Revering M.D.   On: 06/27/2021 17:24    Procedures Procedures   Medications Ordered in ED Medications - No data to display  ED Course  I have reviewed the triage vital signs and the nursing notes.  Pertinent labs & imaging results that were available during my care of the patient were reviewed by me and considered in my medical decision making (see chart for details).    MDM Rules/Calculators/A&P                          87 year old female presents via EMS from skilled nursing facility after she was transferring to her wheelchair and fell onto her buttocks and developed some low back and sacral pain.  Patient with significant dementia, states she hit her head but does not have a headache and no reports of LOC nausea vomiting.  Patient  appears well and really only complains of  mild lower back pain at the lumbosacral junction.  She denies any hip, neck, chest pain.  No shortness of breath.  Final Clinical Impression(s) / ED Diagnoses Final diagnoses:  Fall, initial encounter  Contusion of sacrum, initial encounter    Rx / DC Orders ED Discharge Orders     None        Renata Caprice 06/27/21 1800    Nena Polio, MD 06/27/21 [phone]

## 2021-06-27 NOTE — Discharge Instructions (Addendum)
X-rays of your lower back, pelvis and hips were negative for fracture.  CT scan of your head was negative for any acute intracranial process.  You may continue with Tylenol as needed for pain.  Slowly progress activity as tolerated.  If continued pain in 1 week, follow-up with orthopedics or primary care provider.  Return to the ER for any worsening symptoms or urgent changes in health.

## 2021-06-27 NOTE — ED Triage Notes (Addendum)
Pt states "im clumsy" when asked why she fell. Pt states she did hit her head but it does not hurt. Pt denies all pain at this time. AOx2.

## 2021-06-27 NOTE — ED Notes (Signed)
Pt calm , collective , transported via EMS. Paper work given to EMS .

## 2021-06-27 NOTE — ED Notes (Addendum)
Pt seen attempting to get up out of bed by NP and was unsteady. Pt assisted back to bed. Pt moved to room closer to nurses station and bed alarm placed under patient. POC and safety discussed with patient. Pt sts she is just trying to go home. Pt advised that she will need to be evaluated and a ride home arranged. Pt agreeable at this time.

## 2021-06-27 NOTE — ED Notes (Signed)
First nurse note: pt comes ems from [facility]. Pt was transferring to wheelchair and just sat down hard on the floor. C/o bottom pain. Pt does have dementia. VSS.

## 2021-07-23 IMAGING — DX DG CHEST 1V PORT
1 series · 1 of 1 positions shown · non-contrast
Comparison: Chest x-ray 08/10/2019.

CLINICAL DATA: 89-year-old female with history of epistaxis.

EXAM:
PORTABLE CHEST 1 VIEW

[chest ap]
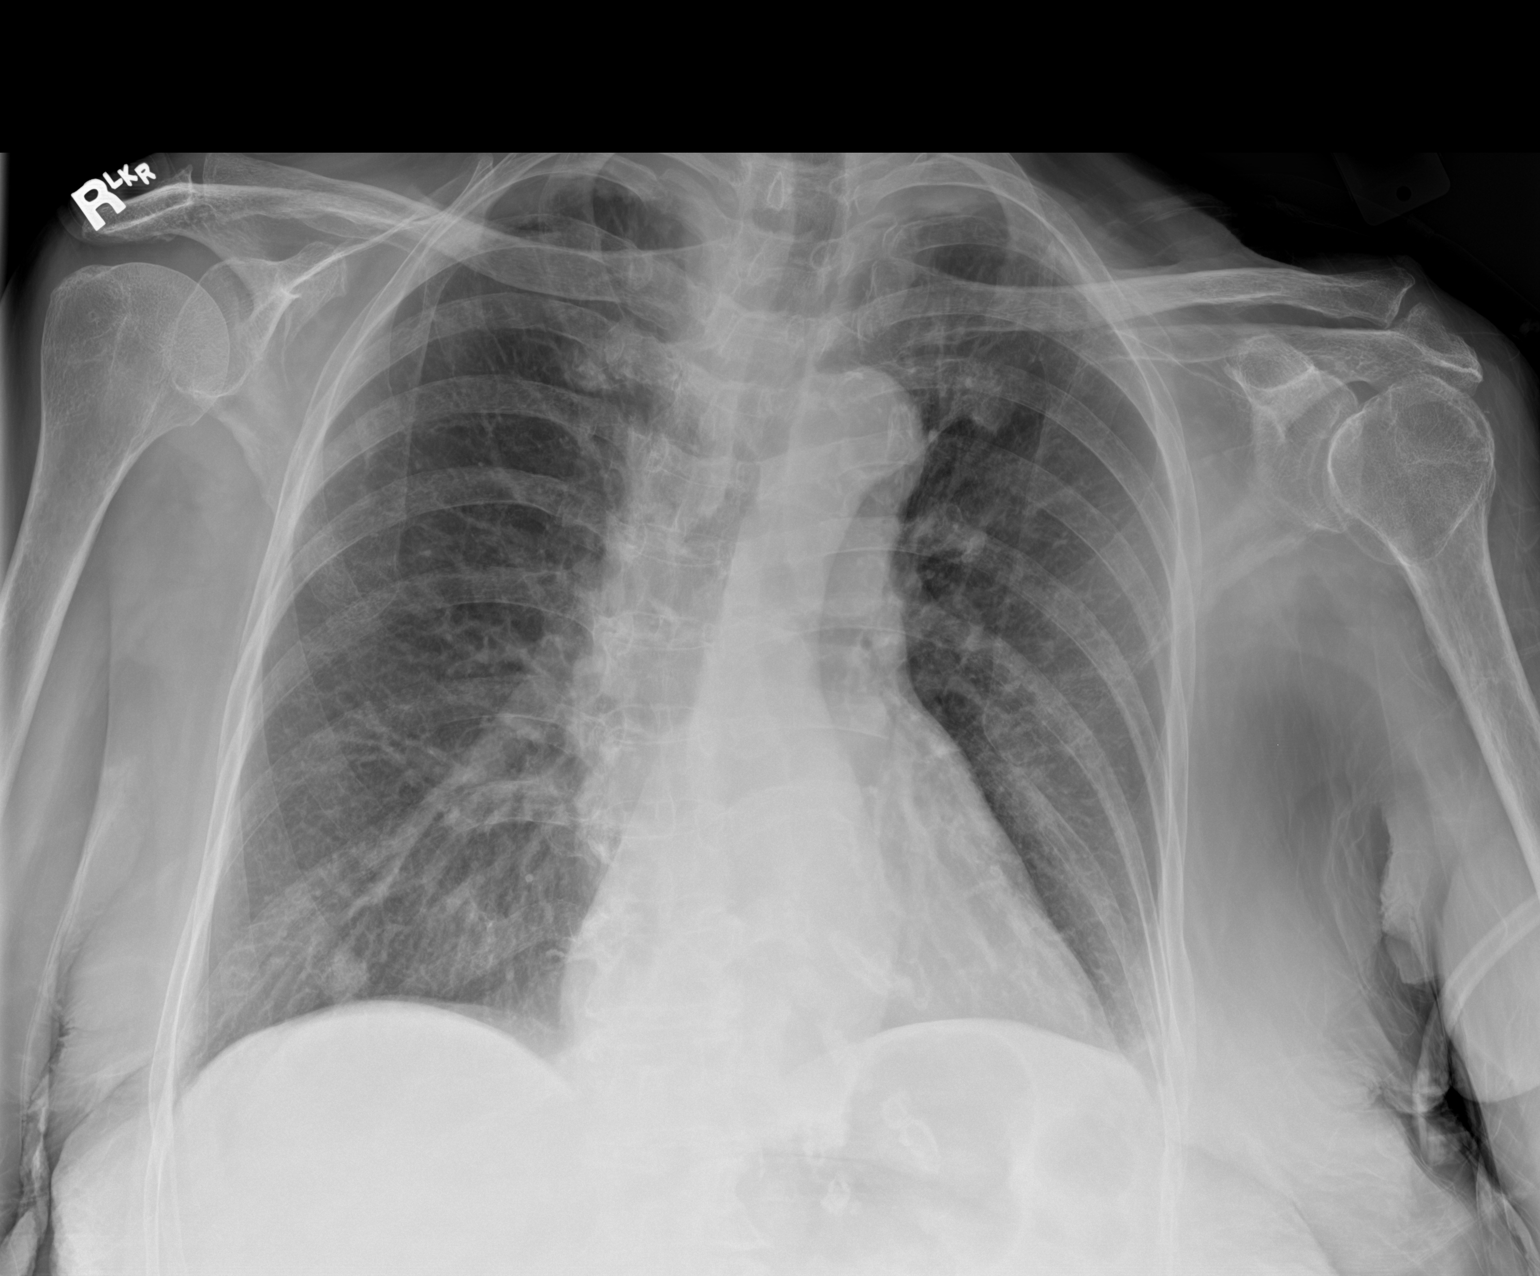

[1 of 1 positions shown; findings below may reference images not displayed]

FINDINGS: Lung volumes are normal. No consolidative airspace disease. No
pleural effusions. No pneumothorax. No pulmonary nodule or mass
noted. Pulmonary vasculature and the cardiomediastinal silhouette
are within normal limits. Aortic atherosclerosis. Area of sclerosis
projecting over the anterior aspect of the right sixth rib, stable
compared to prior studies, compatible with a healing fracture.
IMPRESSION: 1. No radiographic evidence of acute cardiopulmonary disease.
2. Aortic atherosclerosis.

## 2021-07-26 ENCOUNTER — Emergency Department
Admission: EM | Admit: 2021-07-26 | Discharge: 2021-08-19 | Disposition: E | Payer: Medicare HMO | Attending: Emergency Medicine | Admitting: Emergency Medicine

## 2021-07-26 ENCOUNTER — Emergency Department: Payer: Medicare HMO

## 2021-07-26 DIAGNOSIS — I480 Paroxysmal atrial fibrillation: Secondary | ICD-10-CM | POA: Insufficient documentation

## 2021-07-26 DIAGNOSIS — Z20822 Contact with and (suspected) exposure to covid-19: Secondary | ICD-10-CM | POA: Insufficient documentation

## 2021-07-26 DIAGNOSIS — J449 Chronic obstructive pulmonary disease, unspecified: Secondary | ICD-10-CM | POA: Diagnosis not present

## 2021-07-26 DIAGNOSIS — Z85038 Personal history of other malignant neoplasm of large intestine: Secondary | ICD-10-CM | POA: Insufficient documentation

## 2021-07-26 DIAGNOSIS — I469 Cardiac arrest, cause unspecified: Secondary | ICD-10-CM | POA: Diagnosis not present

## 2021-07-26 DIAGNOSIS — Z96641 Presence of right artificial hip joint: Secondary | ICD-10-CM | POA: Insufficient documentation

## 2021-07-26 DIAGNOSIS — Z87891 Personal history of nicotine dependence: Secondary | ICD-10-CM | POA: Insufficient documentation

## 2021-07-26 DIAGNOSIS — I10 Essential (primary) hypertension: Secondary | ICD-10-CM | POA: Insufficient documentation

## 2021-07-26 LAB — COMPREHENSIVE METABOLIC PANEL
ALT: 167 U/L — ABNORMAL HIGH (ref 0–44)
AST: 324 U/L — ABNORMAL HIGH (ref 15–41)
Albumin: 2.5 g/dL — ABNORMAL LOW (ref 3.5–5.0)
Alkaline Phosphatase: 147 U/L — ABNORMAL HIGH (ref 38–126)
Anion gap: 14 (ref 5–15)
BUN: 26 mg/dL — ABNORMAL HIGH (ref 8–23)
CO2: 19 mmol/L — ABNORMAL LOW (ref 22–32)
Calcium: 8.8 mg/dL — ABNORMAL LOW (ref 8.9–10.3)
Chloride: 104 mmol/L (ref 98–111)
Creatinine, Ser: 1.11 mg/dL — ABNORMAL HIGH (ref 0.44–1.00)
GFR, Estimated: 47 mL/min — ABNORMAL LOW (ref >60)
Glucose, Bld: 301 mg/dL — ABNORMAL HIGH (ref 70–99)
Potassium: 4.2 mmol/L (ref 3.5–5.1)
Sodium: 137 mmol/L (ref 135–145)
Total Bilirubin: 0.5 mg/dL (ref 0.3–1.2)
Total Protein: 6.1 g/dL — ABNORMAL LOW (ref 6.5–8.1)

## 2021-07-26 LAB — CBC WITH DIFFERENTIAL/PLATELET
Abs Immature Granulocytes: 3.01 10*3/uL — ABNORMAL HIGH (ref 0.00–0.07)
Basophils Absolute: 0.1 10*3/uL (ref 0.0–0.1)
Basophils Relative: 0 %
Eosinophils Absolute: 0.3 10*3/uL (ref 0.0–0.5)
Eosinophils Relative: 1 %
HCT: 37.2 % (ref 36.0–46.0)
Hemoglobin: 10.9 g/dL — ABNORMAL LOW (ref 12.0–15.0)
Immature Granulocytes: 9 %
Lymphocytes Relative: 32 %
Lymphs Abs: 11.2 10*3/uL — ABNORMAL HIGH (ref 0.7–4.0)
MCH: 27.9 pg (ref 26.0–34.0)
MCHC: 29.3 g/dL — ABNORMAL LOW (ref 30.0–36.0)
MCV: 95.4 fL (ref 80.0–100.0)
Monocytes Absolute: 1.2 10*3/uL — ABNORMAL HIGH (ref 0.1–1.0)
Monocytes Relative: 3 %
Neutro Abs: 19.1 10*3/uL — ABNORMAL HIGH (ref 1.7–7.7)
Neutrophils Relative %: 55 %
Platelets: 166 10*3/uL (ref 150–400)
RBC: 3.9 MIL/uL (ref 3.87–5.11)
RDW: 14.7 % (ref 11.5–15.5)
Smear Review: NORMAL
WBC: 33.3 10*3/uL — ABNORMAL HIGH (ref 4.0–10.5)
nRBC: 0.1 % (ref 0.0–0.2)

## 2021-07-26 LAB — LACTIC ACID, PLASMA: Lactic Acid, Venous: 10.2 mmol/L (ref 0.5–1.9)

## 2021-07-26 LAB — PROTIME-INR
INR: 1.8 — ABNORMAL HIGH (ref 0.8–1.2)
Prothrombin Time: 20.5 seconds — ABNORMAL HIGH (ref 11.4–15.2)

## 2021-07-26 LAB — TROPONIN I (HIGH SENSITIVITY): Troponin I (High Sensitivity): 157 ng/L (ref <18)

## 2021-07-26 LAB — RESP PANEL BY RT-PCR (FLU A&B, COVID) ARPGX2
Influenza A by PCR: NEGATIVE
Influenza B by PCR: NEGATIVE
SARS Coronavirus 2 by RT PCR: NEGATIVE

## 2021-07-26 LAB — MAGNESIUM: Magnesium: 2.6 mg/dL — ABNORMAL HIGH (ref 1.7–2.4)

## 2021-07-26 LAB — APTT: aPTT: 95 seconds — ABNORMAL HIGH (ref 24–36)

## 2021-07-26 MED ORDER — MORPHINE 100MG IN NS 100ML (1MG/ML) PREMIX INFUSION
1.0000 mg/h | INTRAVENOUS | Status: DC
Start: 2021-07-26 — End: 2021-07-27

## 2021-07-26 MED ORDER — MORPHINE SULFATE (PF) 4 MG/ML IV SOLN
INTRAVENOUS | Status: AC
  Administered 2021-07-26: 8 mg via INTRAVENOUS
  Filled 2021-07-26: qty 2

## 2021-07-26 MED ORDER — MORPHINE SULFATE (PF) 4 MG/ML IV SOLN: 8.0000 mg | Freq: Once | INTRAVENOUS | Status: AC

## 2021-07-26 MED ORDER — NOREPINEPHRINE 4 MG/250ML-% IV SOLN
INTRAVENOUS | Status: AC
  Administered 2021-07-26: 20 ug/min
  Administered 2021-07-26: 10 ug/min
  Filled 2021-07-26: qty 250

## 2021-07-27 LAB — PATHOLOGIST SMEAR REVIEW

## 2021-07-31 LAB — CULTURE, BLOOD (ROUTINE X 2): Culture: NO GROWTH

## 2021-08-19 NOTE — ED Notes (Signed)
Pt presents as post code to ED from nursing facility. Pt found down by nursing staff with food in throat and initated CPR at 1819, with EMS reporting ROSC at 1838. Pt arrived and intubated in ED upon arrival. Pt did have sustained  V-tach at 1844 with shock initiated at 200 jules with a-fib being next rhythm. Pt family notified.

## 2021-08-19 NOTE — ED Notes (Signed)
Pt extubated via [place]. Pt time of death 03/19/2111.

## 2021-08-19 NOTE — Progress Notes (Signed)
A code STEMI was activated by EMS.  This is a 87 year old female with history of paroxysmal atrial fibrillation, dementia and COPD on 2 L of nasal cannula.  She lives in a nursing home and was found unresponsive sitting on a chair blue from the shoulders up while eating.  She was pulseless and thus she received 10 minutes of CPR and ROSC was obtained.  He was then intubated. A code STEMI was activated due to septal ST elevation.  I reviewed the EKG and it does not meet criteria for STEMI.  Repeat EKG in the ED also showed no evidence of STEMI.  Regardless, the patient is not a candidate for invasive procedures given age, dementia and comorbidities.  Apparently, she is supposed to be DNR as well.  Recommend discussion with the family and consider comfort care.

## 2021-08-19 NOTE — ED Provider Notes (Signed)
[facility]  ____________________________________________   Event Date/Time   First MD Initiated Contact with Patient 2021/08/14 1918     (approximate)  I have reviewed the triage vital signs and the nursing notes.   HISTORY  Chief Complaint Cardiac Arrest (Pt found down with food in mouth, pulseless. CPR initiated lasting 10 minutes, with two doses of epi given. Pt presented to the ED with pulse. )    HPI RAEDEN BATTIE is a 87 y.o. female past medical history of atrial fibrillation, dementia, COPD on 2 L nasal cannula at baseline who presents after cardiac arrest.  Per EMS the patient was in her normal state of health when she was found sitting in a chair blue from the shoulders up while eating.  When EMS placed the [facility] airway there was food all in her mouth.  She received epi x2 and then approximately 10 minutes of CPR ROSC was obtained.  She was then intubated with a 7.0 tube.  Did obtain additional history given the patient's critical illness.         Past Medical History:  Diagnosis Date   A-fib [facility])    Arthritis    Cognitive dysfunction    Colon adenoma    COPD (chronic obstructive pulmonary disease) (HCC)    Depression    Familial tremor    HLD (hyperlipidemia)    Hypertension    Osteoarthritis    Osteoporosis    Ovarian cyst    Psoriatic arthropathy ([place])    Rotator cuff syndrome    Sigmoid diverticulitis     Patient Active Problem List   Diagnosis Date Noted   Closed displaced fracture of right femoral neck ([place]) 03/30/2021   Leukocytosis 03/30/2021   Cognitive impairment 06/01/2019   Psoriasis with arthropathy ([place]) 01/05/2016   Benign essential HTN 09/08/2015   Paroxysmal atrial fibrillation ([place]) 08/31/2015   OP (osteoporosis) 04/20/2015    Past Surgical History:  Procedure Laterality Date   ABDOMINAL HYSTERECTOMY     COLONOSCOPY     HIP ARTHROPLASTY Right 03/31/2021   Procedure: ARTHROPLASTY BIPOLAR HIP  (HEMIARTHROPLASTY);  Surgeon: Renee Harder, MD;  Location: [facility] ORS;  Service: Orthopedics;  Laterality: Right;    Prior to Admission medications   Medication Sig Start Date End Date Taking? Authorizing Provider  acetaminophen (TYLENOL) 325 MG tablet Take 650 mg by mouth every 4 (four) hours as needed.    [provider]  alendronate (FOSAMAX) 70 MG tablet Take 70 mg by mouth once a week. Reported on 01/22/2016    [provider]  aspirin 325 MG EC tablet Take 325 mg by mouth daily.    [provider]  calcium-vitamin D (OSCAL-500) 500-400 MG-UNIT tablet Take 1 tablet by mouth daily.    [provider]  citalopram (CELEXA) 10 MG tablet Take 10 mg by mouth daily.    [provider]  ENBREL SURECLICK 50 MG/ML injection Inject 50 mg into the muscle once a week. 08/21/19   [provider]  folic acid (FOLVITE) 1 MG tablet Take 5 mg by mouth every Tuesday.    [provider]  gabapentin (NEURONTIN) 300 MG capsule Take 300 mg by mouth at bedtime. 08/17/19   [provider]  Methotrexate, PF, 25 MG/0.4ML SOAJ Inject 0.8 mLs into the skin once a week. Monday 0800    [provider]  metoprolol tartrate (LOPRESSOR) 25 MG tablet Take 25 mg by mouth 2 (two) times daily.    [provider]  mirtazapine (REMERON) 7.5 MG tablet Take 7.5 mg by mouth at bedtime.    [provider]  Omega-3 Fatty Acids (FISH OIL) 500 MG CAPS Take 500 mg by mouth daily.    [provider]  senna-docusate (SENOKOT-S) 8.6-50 MG tablet Take 2 tablets by mouth 2 (two) times daily. 05/14/20   Carrie Mew, MD  vitamin B-12 (CYANOCOBALAMIN) 1000 MCG tablet Take 1,000 mcg by mouth daily.    [provider]  Zinc Oxide (BOUDREAUXS BUTT PASTE) 16 % OINT Apply topically 2 (two) times daily as needed (redness). Apply to sacrum    [provider]    Allergies Patient has no known allergies.  Family History   Problem Relation Age of Onset   Diabetes Mother    Ovarian cancer Mother    Lung cancer Father    Aneurysm Brother    Diabetes Brother    Breast cancer Sister    Colon cancer Sister    Diabetes Sister    Kidney disease Other        nephew   Bladder Cancer Neg Hx     Social History Social History   Tobacco Use   Smoking status: Former    Types: Cigarettes    Quit date: 05/26/1995    Years since quitting: 26.1   Smokeless tobacco: Never  Substance Use Topics   Alcohol use: No   Drug use: Never    Review of Systems   Review of Systems  Unable to perform ROS: Acuity of condition   Physical Exam Updated Vital Signs BP 113/64   Pulse (!) 109   Temp (!) 95.2 F (35.1 C)   Resp 15   Ht '5\' 4"'$  (1.626 m)   Wt 54.4 kg   SpO2 100%   BMI 20.59 kg/m   Physical Exam Constitutional:      Appearance: She is ill-appearing.     Comments: Elderly female, critically ill-appearing  HENT:     Head: Normocephalic and atraumatic.  Cardiovascular:     Rate and Rhythm: Tachycardia present. Rhythm irregular.  Pulmonary:     Comments: Intubated, mechanical breath sounds Abdominal:     General: There is no distension.     Palpations: There is no mass.     Tenderness: There is no guarding.  Musculoskeletal:        General: No swelling.     Cervical back: Neck supple. No rigidity.  Skin:    General: Skin is warm and dry.     Capillary Refill: Capillary refill takes less than 2 seconds.  Neurological:     Comments: GCS 3  Psychiatric:     Comments: Unable to assess     LABS (all labs ordered are listed, but only abnormal results are displayed)  Labs Reviewed  CBC WITH DIFFERENTIAL/PLATELET - Abnormal; Notable for the following components:      Result Value   WBC 33.3 (*)    Hemoglobin 10.9 (*)    MCHC 29.3 (*)    Neutro Abs 19.1 (*)    Lymphs Abs 11.2 (*)    Monocytes Absolute 1.2 (*)    Abs Immature Granulocytes 3.01 (*)    All other components within normal limits   COMPREHENSIVE METABOLIC PANEL - Abnormal; Notable for the following components:   CO2 19 (*)    Glucose, Bld 301 (*)    BUN 26 (*)    Creatinine, Ser 1.11 (*)    Calcium 8.8 (*)    Total Protein  6.1 (*)    Albumin 2.5 (*)    AST 324 (*)    ALT 167 (*)    Alkaline Phosphatase 147 (*)    GFR, Estimated 47 (*)    All other components within normal limits  PROTIME-INR - Abnormal; Notable for the following components:   Prothrombin Time 20.5 (*)    INR 1.8 (*)    All other components within normal limits  APTT - Abnormal; Notable for the following components:   aPTT 95 (*)    All other components within normal limits  LACTIC ACID, PLASMA - Abnormal; Notable for the following components:   Lactic Acid, Venous 10.2 (*)    All other components within normal limits  MAGNESIUM - Abnormal; Notable for the following components:   Magnesium 2.6 (*)    All other components within normal limits  TROPONIN I (HIGH SENSITIVITY) - Abnormal; Notable for the following components:   Troponin I (High Sensitivity) 157 (*)    All other components within normal limits  RESP PANEL BY RT-PCR (FLU A&B, COVID) ARPGX2  CULTURE, BLOOD (ROUTINE X 2)  CULTURE, BLOOD (ROUTINE X 2)  LACTIC ACID, PLASMA  BLOOD GAS, VENOUS  URINALYSIS, COMPLETE (UACMP) WITH MICROSCOPIC  PATHOLOGIST SMEAR REVIEW  TROPONIN I (HIGH SENSITIVITY)   ____________________________________________  EKG  Atrial fibrillation with rapid ventricular response, PVCs, no STEMI ____________________________________________  RADIOLOGY I, Madelin Headings, personally viewed and evaluated these images (plain radiographs) as part of my medical decision making, as well as reviewing the written report by the radiologist.  ED MD interpretation:  n/a    ____________________________________________   PROCEDURES  Procedure(s) performed (including Critical Care):  .Critical Care  Date/Time: 07/27/2021 12:15 AM Performed by: Rada Hay, MD Authorized by: Rada Hay, MD   Critical care provider statement:    Critical care time (minutes):  45   Critical care was time spent personally by me on the following activities:  Discussions with consultants, evaluation of patient's response to treatment, examination of patient, ordering and performing treatments and interventions, ordering and review of laboratory studies, ordering and review of radiographic studies, pulse oximetry, re-evaluation of patient's condition, obtaining history from patient or surrogate, review of old charts and development of treatment plan with patient or surrogate   I assumed direction of critical care for this patient from another provider in my specialty: no     ____________________________________________   INITIAL IMPRESSION / ASSESSMENT AND PLAN / ED COURSE     The patient is a 87 year old female who presents after a cardiac arrest.  Apparently found in the nursing home blue with food in her mouth.  She was intubated by EMS and got 2 of epinephrine and about 10 minutes of CPR in the field.  On arrival she is hypotensive 70s over 50s and tachycardic.  Initial EKG does not show a STEMI, appears to be A. fib with RVR.  She briefly went into V. tach and was shocked at 200 J back into atrial fibrillation.  She was started on Levophed and given fluids.  Attempted to call the patient's daughter to give an update and understand her wishes but was unable to contact.  Will admit to the ICU.  Patient's daughter [name] patient's daughter and son arrived and decided to withdraw care.  Patient was given a dose of morphine and the ET tube was removed and Levophed stopped.  Time of death was 9:12 PM.   ____________________________________________   FINAL CLINICAL IMPRESSION(S) / ED  DIAGNOSES  Final diagnoses:  Cardiac arrest [facility])     ED Discharge Orders     None        Note:  This document was prepared using Dragon voice recognition  software and may include unintentional dictation errors.    Rada Hay, MD 07/27/21 [phone]

## 2021-08-19 NOTE — ED Notes (Signed)
Pt family at bedside discussing plan of care with ED provider. Pt vitals stable at this time. Chest x-ray confirmed tube placement.

## 2021-08-19 NOTE — ED Notes (Signed)
All tubes lines removed from pt. Pt ready for morgue.

## 2021-08-19 DEATH — deceased

## 2022-07-11 IMAGING — DX DG CHEST 1V PORT
1 series · 1 of 1 positions shown · non-contrast
Comparison: September 01, 2019

CLINICAL DATA: Hypoxia

EXAM:
PORTABLE CHEST 1 VIEW

[chest ap]
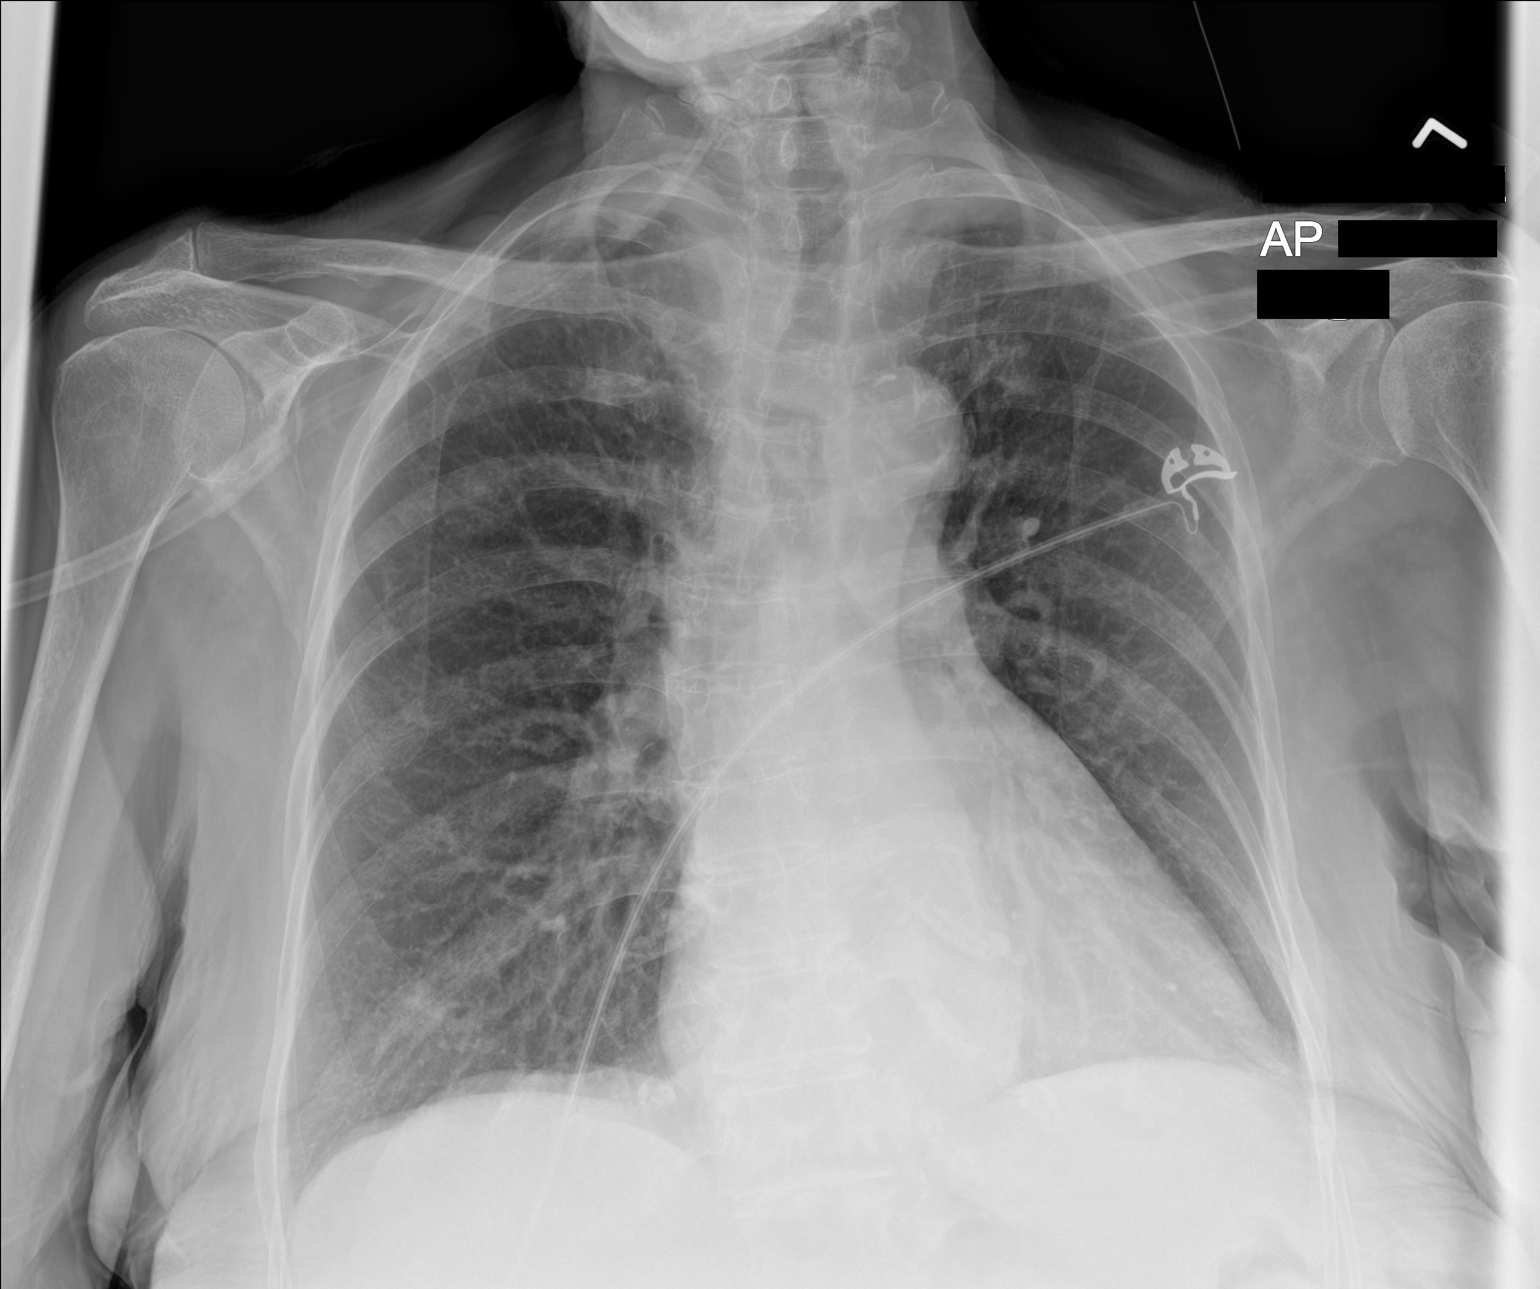

[1 of 1 positions shown; findings below may reference images not displayed]

FINDINGS: The heart size and mediastinal contours are within normal limits.
Mild prominence of the central pulmonary vasculature is seen. No
large airspace consolidation or pleural effusion. No acute osseous
abnormality. Aortic knob calcifications are seen.
IMPRESSION: Small pulmonary vascular congestion.

## 2023-02-19 IMAGING — CR DG CHEST 1V
1 series · 2 of 2 positions shown · non-contrast
Comparison: November 25, 2020.

CLINICAL DATA: Fall.

EXAM:
CHEST  1 VIEW

[Series 1: dg chest 1 view · 0.14mm/px · 2 of 2 slices shown]
[im 1/2]
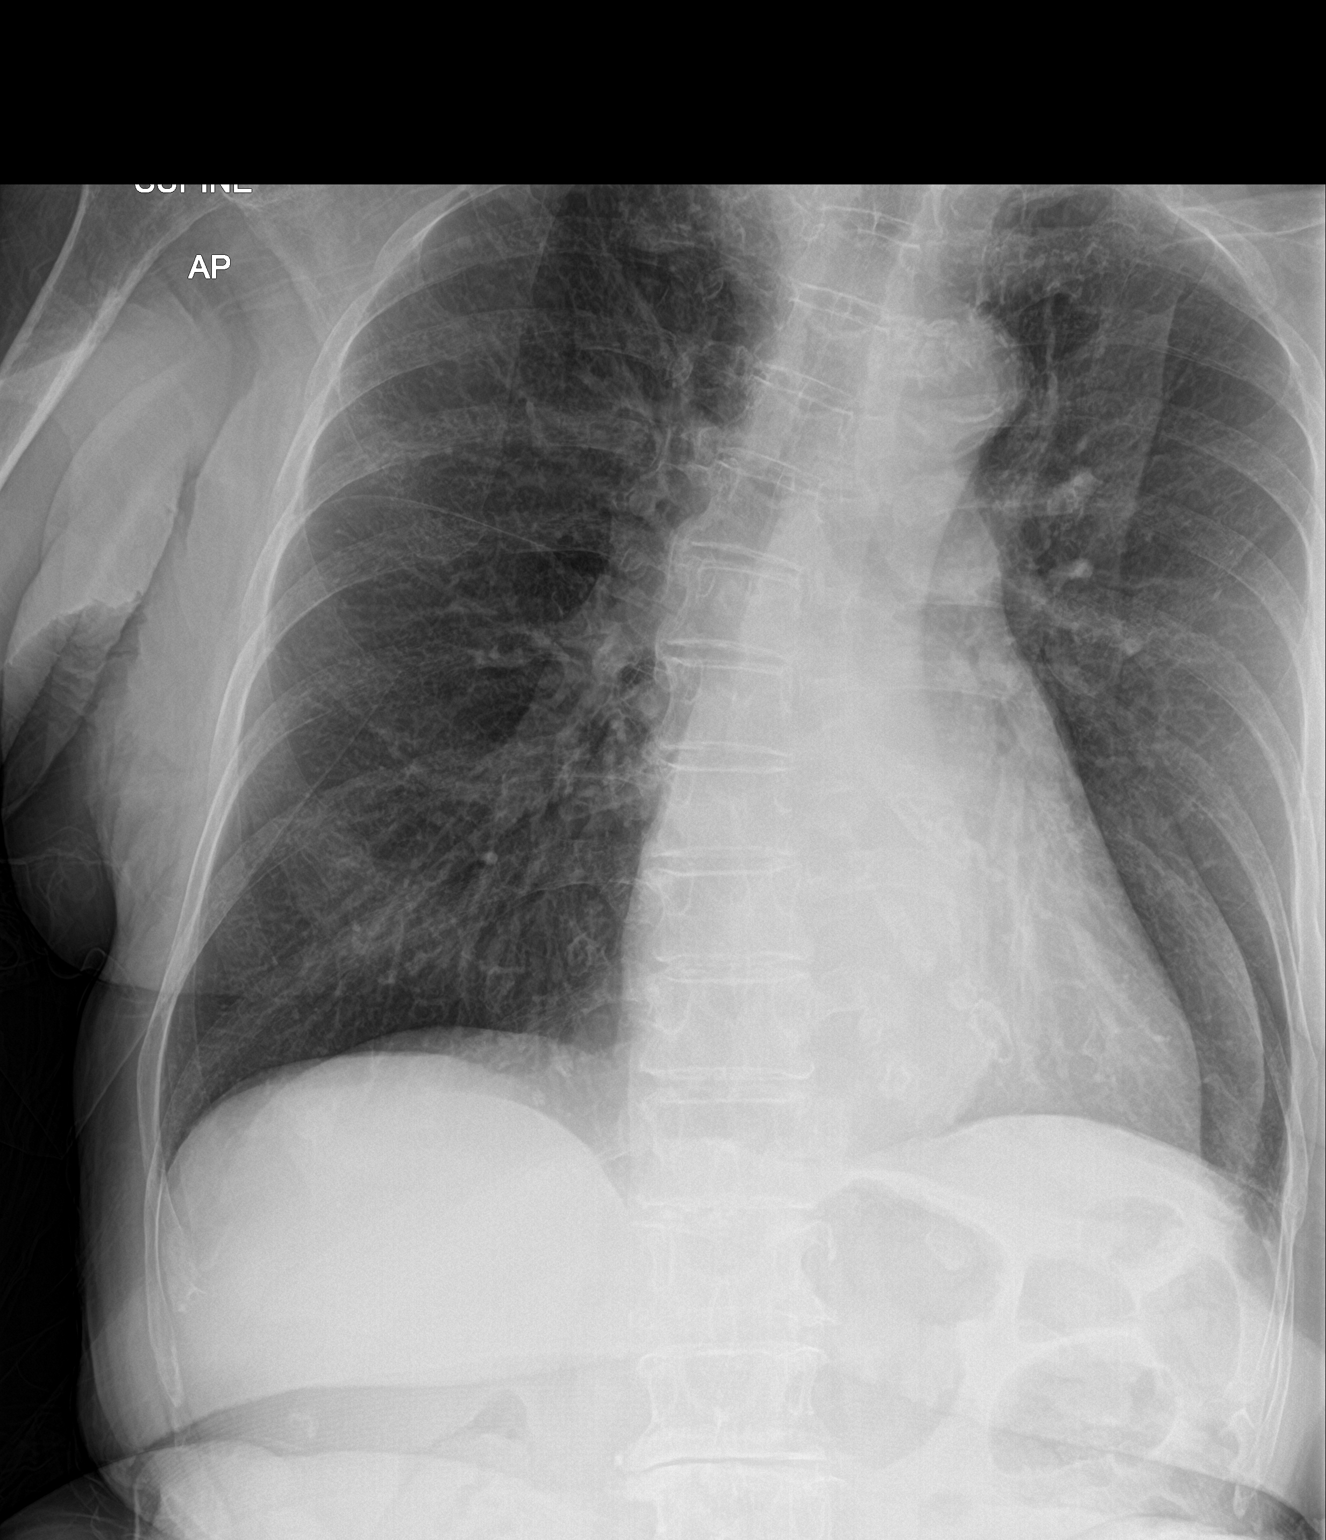
[im 2/2]
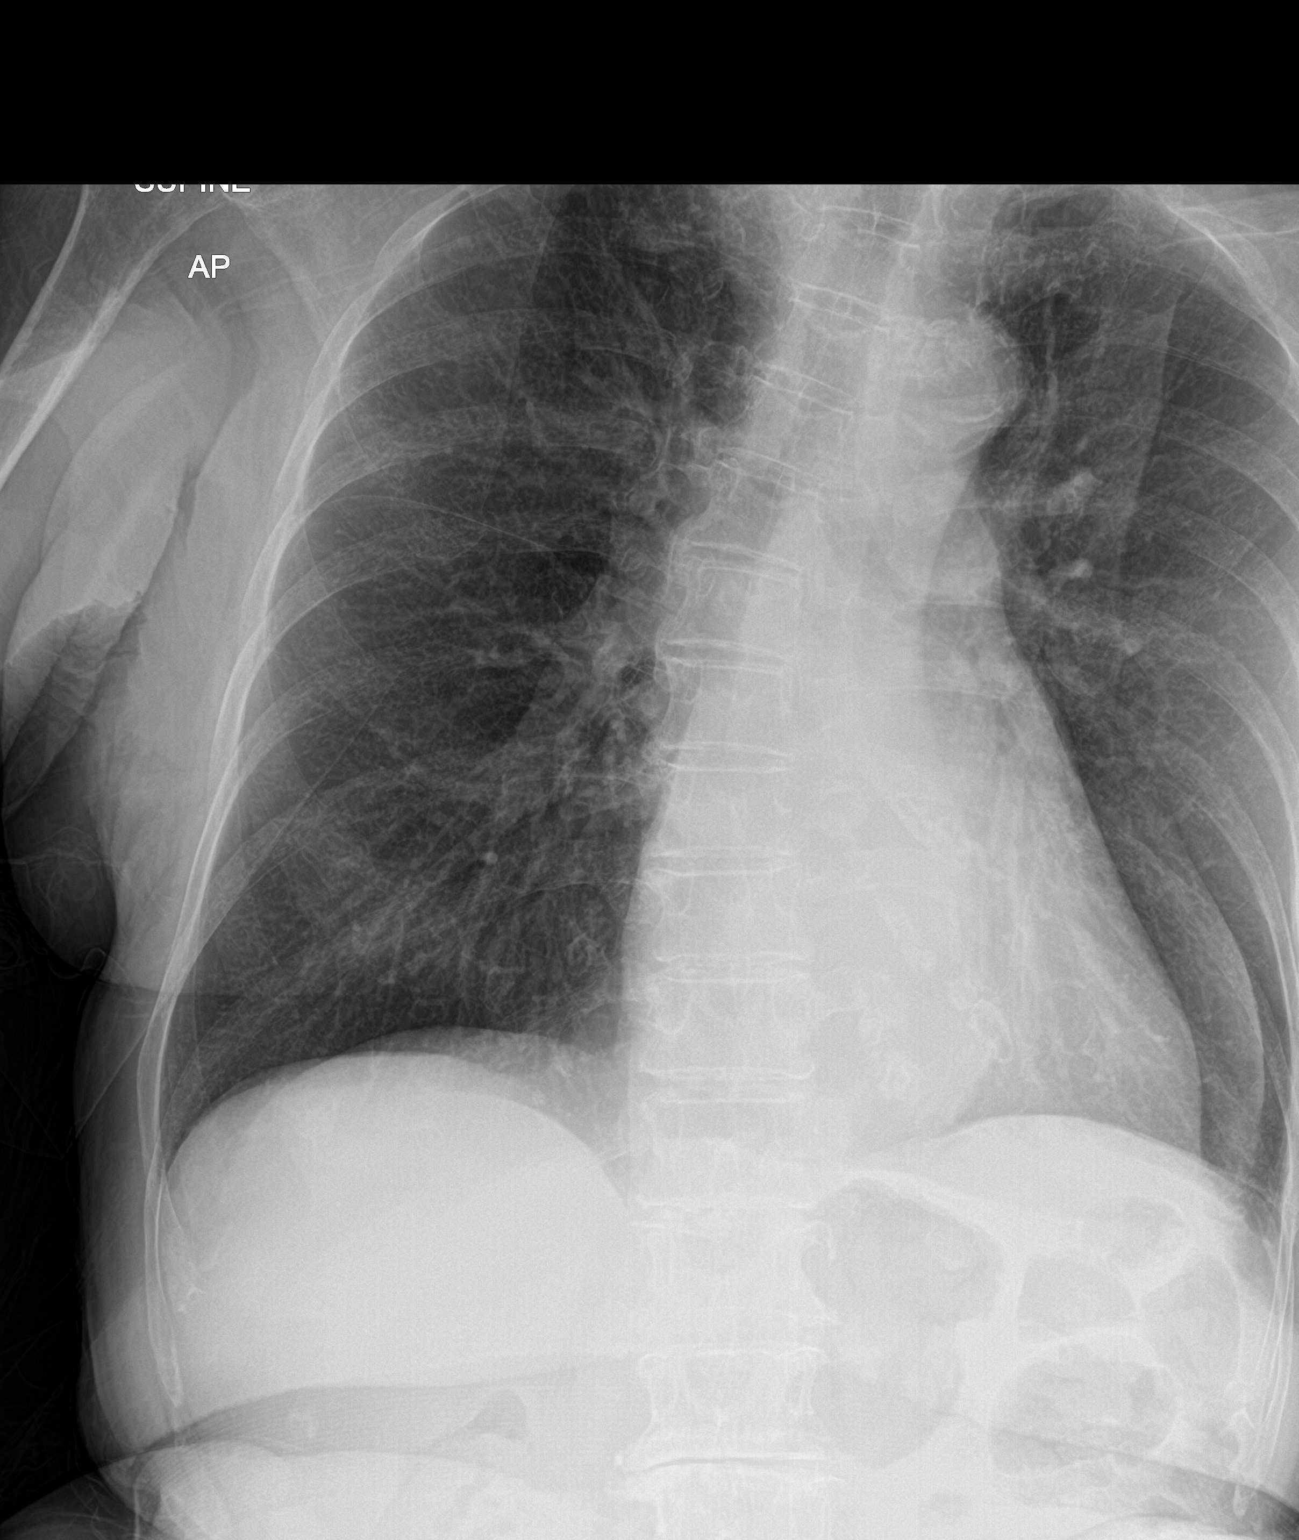

[2 of 2 positions shown; findings below may reference images not displayed]

FINDINGS: The heart size and mediastinal contours are within normal limits.
Both lungs are clear. The visualized skeletal structures are
unremarkable.
IMPRESSION: No active disease.
